# Patient Record
Sex: Female | Born: 1985
Health system: Southern US, Community
[De-identification: ages and names within clinical notes are randomized; demographics above are authoritative.]

## PROBLEM LIST (undated history)

## (undated) ENCOUNTER — Inpatient Hospital Stay (HOSPITAL_COMMUNITY): Payer: Self-pay

## (undated) DIAGNOSIS — O30049 Twin pregnancy, dichorionic/diamniotic, unspecified trimester: Secondary | ICD-10-CM

## (undated) DIAGNOSIS — R87619 Unspecified abnormal cytological findings in specimens from cervix uteri: Secondary | ICD-10-CM

## (undated) DIAGNOSIS — O139 Gestational [pregnancy-induced] hypertension without significant proteinuria, unspecified trimester: Secondary | ICD-10-CM

## (undated) DIAGNOSIS — K219 Gastro-esophageal reflux disease without esophagitis: Secondary | ICD-10-CM

## (undated) DIAGNOSIS — IMO0002 Reserved for concepts with insufficient information to code with codable children: Secondary | ICD-10-CM

## (undated) DIAGNOSIS — Z8669 Personal history of other diseases of the nervous system and sense organs: Secondary | ICD-10-CM

## (undated) DIAGNOSIS — R519 Headache, unspecified: Secondary | ICD-10-CM

## (undated) DIAGNOSIS — R51 Headache: Secondary | ICD-10-CM

## (undated) DIAGNOSIS — O329XX0 Maternal care for malpresentation of fetus, unspecified, not applicable or unspecified: Secondary | ICD-10-CM

## (undated) DIAGNOSIS — D649 Anemia, unspecified: Secondary | ICD-10-CM

## (undated) DIAGNOSIS — I10 Essential (primary) hypertension: Secondary | ICD-10-CM

## (undated) DIAGNOSIS — Z98891 History of uterine scar from previous surgery: Secondary | ICD-10-CM

## (undated) DIAGNOSIS — R0602 Shortness of breath: Secondary | ICD-10-CM

## (undated) DIAGNOSIS — O3090 Multiple gestation, unspecified, unspecified trimester: Secondary | ICD-10-CM

## (undated) HISTORY — DX: Headache, unspecified: R51.9

## (undated) HISTORY — PX: WISDOM TOOTH EXTRACTION: SHX21

## (undated) HISTORY — DX: Headache: R51

## (undated) HISTORY — PX: TUBAL LIGATION: SHX77

---
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
## 2006-05-23 ENCOUNTER — Emergency Department (HOSPITAL_COMMUNITY): Admission: EM | Admit: 2006-05-23 | Discharge: 2006-05-23 | Payer: Self-pay | Admitting: Emergency Medicine

## 2006-07-10 ENCOUNTER — Emergency Department (HOSPITAL_COMMUNITY): Admission: EM | Admit: 2006-07-10 | Discharge: 2006-07-10 | Payer: Self-pay | Admitting: Emergency Medicine

## 2006-08-31 ENCOUNTER — Emergency Department (HOSPITAL_COMMUNITY): Admission: EM | Admit: 2006-08-31 | Discharge: 2006-08-31 | Payer: Self-pay | Admitting: Emergency Medicine

## 2006-09-04 ENCOUNTER — Inpatient Hospital Stay (HOSPITAL_COMMUNITY): Admission: AD | Admit: 2006-09-04 | Discharge: 2006-09-04 | Payer: Self-pay | Admitting: Family Medicine

## 2007-03-11 ENCOUNTER — Inpatient Hospital Stay (HOSPITAL_COMMUNITY): Admission: AD | Admit: 2007-03-11 | Discharge: 2007-03-11 | Payer: Self-pay | Admitting: Obstetrics and Gynecology

## 2007-04-28 ENCOUNTER — Inpatient Hospital Stay (HOSPITAL_COMMUNITY): Admission: AD | Admit: 2007-04-28 | Discharge: 2007-04-28 | Payer: Self-pay | Admitting: Obstetrics & Gynecology

## 2007-04-29 ENCOUNTER — Inpatient Hospital Stay (HOSPITAL_COMMUNITY): Admission: AD | Admit: 2007-04-29 | Discharge: 2007-04-29 | Payer: Self-pay | Admitting: Obstetrics and Gynecology

## 2007-05-05 ENCOUNTER — Inpatient Hospital Stay (HOSPITAL_COMMUNITY): Admission: AD | Admit: 2007-05-05 | Discharge: 2007-05-07 | Payer: Self-pay | Admitting: Obstetrics and Gynecology

## 2007-12-28 ENCOUNTER — Emergency Department (HOSPITAL_COMMUNITY): Admission: EM | Admit: 2007-12-28 | Discharge: 2007-12-28 | Payer: Self-pay | Admitting: Emergency Medicine

## 2008-07-30 ENCOUNTER — Emergency Department (HOSPITAL_COMMUNITY): Admission: EM | Admit: 2008-07-30 | Discharge: 2008-07-30 | Payer: Self-pay | Admitting: Emergency Medicine

## 2008-09-17 ENCOUNTER — Emergency Department (HOSPITAL_COMMUNITY): Admission: EM | Admit: 2008-09-17 | Discharge: 2008-09-17 | Payer: Self-pay | Admitting: Emergency Medicine

## 2009-02-04 ENCOUNTER — Emergency Department (HOSPITAL_COMMUNITY): Admission: EM | Admit: 2009-02-04 | Discharge: 2009-02-04 | Payer: Self-pay | Admitting: Emergency Medicine

## 2009-03-26 ENCOUNTER — Encounter: Admission: RE | Admit: 2009-03-26 | Discharge: 2009-03-26 | Payer: Self-pay | Admitting: Nephrology

## 2009-07-09 ENCOUNTER — Emergency Department (HOSPITAL_COMMUNITY): Admission: EM | Admit: 2009-07-09 | Discharge: 2009-07-09 | Payer: Self-pay | Admitting: Family Medicine

## 2010-12-16 ENCOUNTER — Emergency Department (HOSPITAL_BASED_OUTPATIENT_CLINIC_OR_DEPARTMENT_OTHER)
Admission: EM | Admit: 2010-12-16 | Discharge: 2010-12-16 | Disposition: A | Discharge status: Home / Self Care | Payer: Medicaid Other | Attending: Emergency Medicine | Admitting: Emergency Medicine

## 2010-12-16 DIAGNOSIS — J45909 Unspecified asthma, uncomplicated: Secondary | ICD-10-CM | POA: Insufficient documentation

## 2010-12-16 DIAGNOSIS — R0789 Other chest pain: Secondary | ICD-10-CM | POA: Insufficient documentation

## 2011-01-27 ENCOUNTER — Emergency Department (HOSPITAL_COMMUNITY)
Admission: EM | Admit: 2011-01-27 | Discharge: 2011-01-27 | Disposition: A | Discharge status: Home / Self Care | Payer: Self-pay | Attending: Emergency Medicine | Admitting: Emergency Medicine

## 2011-01-27 DIAGNOSIS — J45909 Unspecified asthma, uncomplicated: Secondary | ICD-10-CM | POA: Insufficient documentation

## 2011-01-27 DIAGNOSIS — M79609 Pain in unspecified limb: Secondary | ICD-10-CM | POA: Insufficient documentation

## 2011-01-27 DIAGNOSIS — G5600 Carpal tunnel syndrome, unspecified upper limb: Secondary | ICD-10-CM | POA: Insufficient documentation

## 2011-01-27 DIAGNOSIS — R209 Unspecified disturbances of skin sensation: Secondary | ICD-10-CM | POA: Insufficient documentation

## 2011-05-22 LAB — URINALYSIS, ROUTINE W REFLEX MICROSCOPIC
Bilirubin Urine: NEGATIVE
Glucose, UA: NEGATIVE mg/dL
Hgb urine dipstick: NEGATIVE
Ketones, ur: NEGATIVE mg/dL
Nitrite: NEGATIVE
Protein, ur: NEGATIVE mg/dL
Specific Gravity, Urine: 1.025 (ref 1.005–1.030)
Urobilinogen, UA: 0.2 mg/dL (ref 0.0–1.0)
pH: 6.5 (ref 5.0–8.0)

## 2011-05-22 LAB — URINE CULTURE: Colony Count: 15000

## 2011-05-22 LAB — URINE MICROSCOPIC-ADD ON

## 2011-05-22 LAB — POCT PREGNANCY, URINE: Preg Test, Ur: NEGATIVE

## 2011-05-28 LAB — CBC
HCT: 25.9 — ABNORMAL LOW
MCHC: 33.7
MCV: 87.2
Platelets: 221
RBC: 3.83 — ABNORMAL LOW
RDW: 15.2 — ABNORMAL HIGH
RDW: 15.4 — ABNORMAL HIGH

## 2011-05-28 LAB — RPR: RPR Ser Ql: NONREACTIVE

## 2011-05-29 LAB — URINALYSIS, ROUTINE W REFLEX MICROSCOPIC
Bilirubin Urine: NEGATIVE
Ketones, ur: NEGATIVE
Nitrite: NEGATIVE
Specific Gravity, Urine: 1.01
Urobilinogen, UA: 0.2

## 2011-06-01 LAB — URINALYSIS, ROUTINE W REFLEX MICROSCOPIC
Bilirubin Urine: NEGATIVE
Hgb urine dipstick: NEGATIVE
Protein, ur: NEGATIVE
Urobilinogen, UA: 0.2

## 2011-06-20 ENCOUNTER — Emergency Department (HOSPITAL_COMMUNITY)
Admission: EM | Admit: 2011-06-20 | Discharge: 2011-06-20 | Disposition: A | Discharge status: Home / Self Care | Payer: Self-pay | Attending: Emergency Medicine | Admitting: Emergency Medicine

## 2011-06-20 DIAGNOSIS — J029 Acute pharyngitis, unspecified: Secondary | ICD-10-CM | POA: Insufficient documentation

## 2011-06-20 DIAGNOSIS — J3489 Other specified disorders of nose and nasal sinuses: Secondary | ICD-10-CM | POA: Insufficient documentation

## 2011-06-20 DIAGNOSIS — H9209 Otalgia, unspecified ear: Secondary | ICD-10-CM | POA: Insufficient documentation

## 2011-10-22 ENCOUNTER — Emergency Department (HOSPITAL_COMMUNITY)
Admission: EM | Admit: 2011-10-22 | Discharge: 2011-10-22 | Disposition: A | Discharge status: Home / Self Care | Payer: Managed Care, Other (non HMO) | Attending: Emergency Medicine | Admitting: Emergency Medicine

## 2011-10-22 ENCOUNTER — Emergency Department (HOSPITAL_COMMUNITY): Payer: Managed Care, Other (non HMO)

## 2011-10-22 ENCOUNTER — Encounter (HOSPITAL_COMMUNITY): Payer: Self-pay | Admitting: Emergency Medicine

## 2011-10-22 DIAGNOSIS — R51 Headache: Secondary | ICD-10-CM | POA: Insufficient documentation

## 2011-10-22 MED ORDER — ALPRAZOLAM 0.25 MG PO TABS: 0.2500 mg | ORAL_TABLET | Freq: Every evening | ORAL | Status: AC | PRN

## 2011-10-22 NOTE — ED Notes (Signed)
Pt here from  Home with c/o high blood pressure , dizziness and lightheaded , Parents have htn

## 2011-10-22 NOTE — ED Provider Notes (Signed)
History     CSN: [id number]  Arrival date & time 10/22/11  1255   First MD Initiated Contact with Patient 10/22/11 1454      Chief Complaint  Patient presents with  . Headache    (Consider location/radiation/quality/duration/timing/severity/associated sxs/prior treatment) Patient is a 26 y.o. female presenting with headaches. The history is provided by the patient.  Headache    patient here with headaches x6 months. Symptoms are persistent and nothing makes them better or worse. No fever or nasal congestion. Took her blood pressure last night was slightly elevated with a systolic 150 diastolic of 90. Denies any chest pain or chest pressure. No shortness of breath. No medications taken for this. Denies any visual changes or upper or lower extremity weakness.  History reviewed. No pertinent past medical history.  History reviewed. No pertinent past surgical history.  Family History  Problem Relation Age of Onset  . Hypertension Mother   . Hypertension Father     History  Substance Use Topics  . Smoking status: Never Smoker   . Smokeless tobacco: Not on file  . Alcohol Use: Yes    OB History    Grav Para Term Preterm Abortions TAB SAB Ect Mult Living                  Review of Systems  Neurological: Positive for headaches.  All other systems reviewed and are negative.    Allergies  Review of patient's allergies indicates no known allergies.  Home Medications   Current Outpatient Rx  Name Route Sig Dispense Refill  . ALBUTEROL SULFATE HFA 108 (90 BASE) MCG/ACT IN AERS Inhalation Inhale 2 puffs into the lungs every 6 (six) hours as needed. For shortness of breath      BP 131/88  Pulse 78  Temp(Src) 97.9 F (36.6 C) (Oral)  Resp 20  SpO2 98%  Physical Exam  Nursing note and vitals reviewed. Constitutional: She is oriented to person, place, and time. She appears well-developed and well-nourished.  Non-toxic appearance. No distress.  HENT:  Head:  Normocephalic and atraumatic.  Eyes: Conjunctivae, EOM and lids are normal. Pupils are equal, round, and reactive to light.  Neck: Normal range of motion. Neck supple. No tracheal deviation present. No mass present.  Cardiovascular: Normal rate, regular rhythm and normal heart sounds.  Exam reveals no gallop.   No murmur heard. Pulmonary/Chest: Effort normal and breath sounds normal. No stridor. No respiratory distress. She has no decreased breath sounds. She has no wheezes. She has no rhonchi. She has no rales.  Abdominal: Soft. Normal appearance and bowel sounds are normal. She exhibits no distension. There is no tenderness. There is no rebound and no CVA tenderness.  Musculoskeletal: Normal range of motion. She exhibits no edema and no tenderness.  Neurological: She is alert and oriented to person, place, and time. She has normal strength. No cranial nerve deficit or sensory deficit. GCS eye subscore is 4. GCS verbal subscore is 5. GCS motor subscore is 6.  Skin: Skin is warm and dry. No abrasion and no rash noted.  Psychiatric: She has a normal mood and affect. Her speech is normal and behavior is normal.    ED Course  Procedures (including critical care time)  Labs Reviewed - No data to display No results found.   No diagnosis found.    MDM  Patient's head CT results reviewed with her and are negative. She is instructed to keep her followup appointment with her primary care Dr.  She was instructed to return if her symptoms get worse        Toy Baker, MD 10/22/11 [phone]

## 2011-10-22 NOTE — Discharge Instructions (Signed)

## 2012-04-08 LAB — OB RESULTS CONSOLE RPR: RPR: NONREACTIVE

## 2012-04-08 LAB — OB RESULTS CONSOLE ANTIBODY SCREEN: Antibody Screen: NEGATIVE

## 2012-04-08 LAB — OB RESULTS CONSOLE HIV ANTIBODY (ROUTINE TESTING): HIV: NONREACTIVE

## 2012-04-08 LAB — OB RESULTS CONSOLE HEPATITIS B SURFACE ANTIGEN: Hepatitis B Surface Ag: NEGATIVE

## 2012-04-08 LAB — OB RESULTS CONSOLE ABO/RH: RH Type: POSITIVE

## 2012-07-18 ENCOUNTER — Inpatient Hospital Stay (HOSPITAL_COMMUNITY)
Admission: AD | Admit: 2012-07-18 | Discharge: 2012-07-18 | Disposition: A | Discharge status: Hospice | Payer: Managed Care, Other (non HMO) | Source: Ambulatory Visit | Attending: Obstetrics and Gynecology | Admitting: Obstetrics and Gynecology

## 2012-07-18 ENCOUNTER — Encounter (HOSPITAL_COMMUNITY): Payer: Self-pay

## 2012-07-18 DIAGNOSIS — O26899 Other specified pregnancy related conditions, unspecified trimester: Secondary | ICD-10-CM

## 2012-07-18 DIAGNOSIS — R5383 Other fatigue: Secondary | ICD-10-CM

## 2012-07-18 DIAGNOSIS — R51 Headache: Secondary | ICD-10-CM | POA: Insufficient documentation

## 2012-07-18 DIAGNOSIS — O99891 Other specified diseases and conditions complicating pregnancy: Secondary | ICD-10-CM | POA: Insufficient documentation

## 2012-07-18 DIAGNOSIS — R42 Dizziness and giddiness: Secondary | ICD-10-CM

## 2012-07-18 DIAGNOSIS — IMO0002 Reserved for concepts with insufficient information to code with codable children: Secondary | ICD-10-CM | POA: Insufficient documentation

## 2012-07-18 DIAGNOSIS — R519 Headache, unspecified: Secondary | ICD-10-CM

## 2012-07-18 HISTORY — DX: Gastro-esophageal reflux disease without esophagitis: K21.9

## 2012-07-18 HISTORY — DX: Anemia, unspecified: D64.9

## 2012-07-18 HISTORY — DX: Gestational (pregnancy-induced) hypertension without significant proteinuria, unspecified trimester: O13.9

## 2012-07-18 HISTORY — DX: Essential (primary) hypertension: I10

## 2012-07-18 LAB — COMPREHENSIVE METABOLIC PANEL
AST: 15 U/L (ref 0–37)
BUN: 5 mg/dL — ABNORMAL LOW (ref 6–23)
CO2: 22 mEq/L (ref 19–32)
Chloride: 100 mEq/L (ref 96–112)
Creatinine, Ser: 0.58 mg/dL (ref 0.50–1.10)
GFR calc non Af Amer: >90 mL/min (ref >90)
Total Bilirubin: 0.2 mg/dL — ABNORMAL LOW (ref 0.3–1.2)

## 2012-07-18 LAB — CBC
HCT: 31.5 % — ABNORMAL LOW (ref 36.0–46.0)
Hemoglobin: 10.7 g/dL — ABNORMAL LOW (ref 12.0–15.0)
MCV: 83.8 fL (ref 78.0–100.0)
RBC: 3.76 MIL/uL — ABNORMAL LOW (ref 3.87–5.11)
WBC: 10.2 10*3/uL (ref 4.0–10.5)

## 2012-07-18 LAB — URINALYSIS, ROUTINE W REFLEX MICROSCOPIC
Glucose, UA: NEGATIVE mg/dL
Hgb urine dipstick: NEGATIVE
Ketones, ur: NEGATIVE mg/dL
Protein, ur: NEGATIVE mg/dL

## 2012-07-18 MED ORDER — PRENATAL VITAMINS 28-0.8 MG PO TABS: 1.0000 | ORAL_TABLET | Freq: Every day | ORAL | Status: DC

## 2012-07-18 NOTE — MAU Provider Note (Signed)
Chief Complaint:  Dizziness, Nausea and Headache   First Provider Initiated Contact with Patient 07/18/12 1601      HPI: Rachel [name] is a 26 y.o. G2P1001 at 10w4dwho presents to maternity admissions reporting dizziness, fatigue, and frequent h/a x2-3 weeks.  Pt forgot to discuss this at prenatal visit today and was worried that she wouldn't be seen for 3-4 more weeks so she came in to MAU today.  She reports good fetal movement, denies LOF, vaginal bleeding, vaginal itching/burning, urinary symptoms, n/v, or fever/chills.     Past Medical History: Past Medical History  Diagnosis Date  . Hypertension   . Anemia   . Pregnancy induced hypertension   . Anxiety   . GERD (gastroesophageal reflux disease)      Past Surgical History: Past Surgical History  Procedure Date  . Wisdom tooth extraction     Family History: Family History  Problem Relation Age of Onset  . Hypertension Mother   . Hypertension Father     Social History: History  Substance Use Topics  . Smoking status: Former Smoker    Types: Cigarettes    Quit date: 07/18/2009  . Smokeless tobacco: Not on file  . Alcohol Use: Yes     Comment: occasional but not while pregnant    Allergies: No Known Allergies  Meds:  Prescriptions prior to admission  Medication Sig Dispense Refill  . Prenatal Vit-Fe Fumarate-FA (PRENATAL MULTIVITAMIN) TABS Take 1 tablet by mouth daily.      . ranitidine (ZANTAC) 150 MG tablet Take 150 mg by mouth daily as needed. heartburn        ROS: Pertinent findings in history of present illness.  Physical Exam  Blood pressure 139/73, pulse 98, temperature 99.3 F (37.4 C), temperature source Oral, resp. rate 16, height 5\' 7"  (1.702 m), weight 86.002 kg (189 lb 9.6 oz), last menstrual period 10/16/2011, SpO2 100.00%. GENERAL: Well-developed, well-nourished female in no acute distress.  HEENT: normocephalic HEART: normal rate RESP: normal effort ABDOMEN: Soft, non-tender, gravid  appropriate for gestational age EXTREMITIES: Nontender, no edema NEURO: alert and oriented SPECULUM EXAM: Deferred    FHT Baby A:  Baseline 140 , moderate variability, accelerations present (10x10), no decelerations FHT Baby B: Baseline 140 , moderate variability, accelerations present (10 x10), no decelerations Contractions: None   Labs: Results for orders placed during the hospital encounter of 07/18/12 (from the past 24 hour(s))  URINALYSIS, ROUTINE W REFLEX MICROSCOPIC     Status: Abnormal   Collection Time   07/18/12  3:10 PM      Component Value Range   Color, Urine YELLOW  YELLOW   APPearance CLOUDY (*) CLEAR   Specific Gravity, Urine 1.020  1.005 - 1.030   pH 7.0  5.0 - 8.0   Glucose, UA NEGATIVE  NEGATIVE mg/dL   Hgb urine dipstick NEGATIVE  NEGATIVE   Bilirubin Urine NEGATIVE  NEGATIVE   Ketones, ur NEGATIVE  NEGATIVE mg/dL   Protein, ur NEGATIVE  NEGATIVE mg/dL   Urobilinogen, UA 0.2  0.0 - 1.0 mg/dL   Nitrite NEGATIVE  NEGATIVE   Leukocytes, UA TRACE (*) NEGATIVE  URINE MICROSCOPIC-ADD ON     Status: Abnormal   Collection Time   07/18/12  3:10 PM      Component Value Range   Squamous Epithelial / LPF MANY (*) RARE   WBC, UA 0-2  <3 WBC/hpf   Bacteria, UA MANY (*) RARE    Assessment: 1. Dizziness   2. Fatigue  3. Headache in pregnancy     Plan: Called Dr Jackelyn Knife to discuss assessment and findings Discharge home Increase PO fluids and food intake Tylenol for h/a F/U with prenatal provider Return to MAU as needed    Medication List     As of 07/18/2012  4:08 PM    ASK your doctor about these medications         prenatal multivitamin Tabs   Take 1 tablet by mouth daily.      ranitidine 150 MG tablet   Commonly known as: ZANTAC   Take 150 mg by mouth daily as needed. heartburn        Sharen Counter Certified Nurse-Midwife 07/18/2012 4:08 PM

## 2012-07-18 NOTE — MAU Note (Signed)
Patient states she has been having headaches, having periods of feeling dizzy and drained for 1-2 weeks. Has nausea with occasional vomiting and not eating well. Patient denies any contractions, leaking or bleeding and reports good fetal movement. Patient is having twins.

## 2012-07-18 NOTE — MAU Note (Signed)
Pt c/o extreme fatigue and a moderate headache.  States she has been stressed dealing with many life changes.  She says she doesn't drink much water but she eats a lot of ice.

## 2012-08-29 ENCOUNTER — Other Ambulatory Visit: Payer: Self-pay | Admitting: Obstetrics and Gynecology

## 2012-08-29 ENCOUNTER — Inpatient Hospital Stay (HOSPITAL_COMMUNITY)
Admission: AD | Admit: 2012-08-29 | Discharge: 2012-08-29 | Disposition: A | Discharge status: Home / Self Care | Payer: Medicaid Other | Source: Ambulatory Visit | Attending: Obstetrics and Gynecology | Admitting: Obstetrics and Gynecology

## 2012-08-29 DIAGNOSIS — N883 Incompetence of cervix uteri: Secondary | ICD-10-CM

## 2012-08-29 DIAGNOSIS — O4700 False labor before 37 completed weeks of gestation, unspecified trimester: Secondary | ICD-10-CM | POA: Insufficient documentation

## 2012-08-29 MED ORDER — BETAMETHASONE SOD PHOS & ACET 6 (3-3) MG/ML IJ SUSP
12.0000 mg | Freq: Once | INTRAMUSCULAR | Status: AC
  Administered 2012-08-29: 12 mg via INTRAMUSCULAR
  Filled 2012-08-29: qty 2

## 2012-08-30 ENCOUNTER — Inpatient Hospital Stay (HOSPITAL_COMMUNITY)
Admission: AD | Admit: 2012-08-30 | Discharge: 2012-08-30 | Disposition: A | Discharge status: Home / Self Care | Payer: Managed Care, Other (non HMO) | Source: Ambulatory Visit | Attending: Obstetrics and Gynecology | Admitting: Obstetrics and Gynecology

## 2012-08-30 DIAGNOSIS — O4700 False labor before 37 completed weeks of gestation, unspecified trimester: Secondary | ICD-10-CM | POA: Insufficient documentation

## 2012-08-30 MED ORDER — BETAMETHASONE SOD PHOS & ACET 6 (3-3) MG/ML IJ SUSP
12.0000 mg | Freq: Once | INTRAMUSCULAR | Status: AC
  Administered 2012-08-30: 12 mg via INTRAMUSCULAR
  Filled 2012-08-30: qty 2

## 2012-09-30 ENCOUNTER — Encounter (HOSPITAL_COMMUNITY): Payer: Self-pay | Admitting: Pharmacist

## 2012-10-03 ENCOUNTER — Encounter (HOSPITAL_COMMUNITY): Payer: Self-pay

## 2012-10-04 ENCOUNTER — Encounter (HOSPITAL_COMMUNITY)
Admission: RE | Admit: 2012-10-04 | Discharge: 2012-10-04 | Disposition: A | Discharge status: Home / Self Care | Payer: PRIVATE HEALTH INSURANCE | Source: Ambulatory Visit | Attending: Obstetrics and Gynecology | Admitting: Obstetrics and Gynecology

## 2012-10-04 ENCOUNTER — Encounter (HOSPITAL_COMMUNITY): Payer: Self-pay

## 2012-10-04 HISTORY — DX: Shortness of breath: R06.02

## 2012-10-04 LAB — CBC
MCV: 80.7 fL (ref 78.0–100.0)
Platelets: 177 10*3/uL (ref 150–400)
RDW: 14.9 % (ref 11.5–15.5)
WBC: 7.8 10*3/uL (ref 4.0–10.5)

## 2012-10-04 LAB — TYPE AND SCREEN: Antibody Screen: NEGATIVE

## 2012-10-04 NOTE — Patient Instructions (Addendum)
Your procedure is scheduled on:10/06/12  Enter through the Main Entrance at :8am Pick up desk phone and dial [phone] and inform us of your arrival.  Please call [phone] if you have any problems the morning of surgery.  Remember: Do not eat or drink after midnight:WED   Take these meds the morning of surgery with a sip of water:ZANTAC   DO NOT wear jewelry, eye make-up, lipstick,body lotion, or dark fingernail polish. Do not shave for 48 hours prior to surgery.  If you are to be admitted after surgery, leave suitcase in car until your room has been assigned. Patients discharged on the day of surgery will not be allowed to drive home.

## 2012-10-05 ENCOUNTER — Encounter (HOSPITAL_COMMUNITY): Payer: Self-pay | Admitting: Obstetrics and Gynecology

## 2012-10-05 DIAGNOSIS — O30049 Twin pregnancy, dichorionic/diamniotic, unspecified trimester: Secondary | ICD-10-CM

## 2012-10-05 DIAGNOSIS — O3090 Multiple gestation, unspecified, unspecified trimester: Secondary | ICD-10-CM

## 2012-10-05 DIAGNOSIS — O329XX0 Maternal care for malpresentation of fetus, unspecified, not applicable or unspecified: Secondary | ICD-10-CM

## 2012-10-05 HISTORY — DX: Multiple gestation, unspecified, unspecified trimester: O32.9XX0

## 2012-10-05 HISTORY — DX: Multiple gestation, unspecified, unspecified trimester: O30.90

## 2012-10-05 HISTORY — DX: Twin pregnancy, dichorionic/diamniotic, unspecified trimester: O30.049

## 2012-10-05 NOTE — H&P (Signed)
Marieann T Marines is a 27 y.o. female G2P1001 at 37wk with twin IUP.  Pregnancy complicated by shortened cervix, treated with vaginal progesterone. Received BMZ 1/13 and 1/14.  First Trimester screen revealed increased NT, nl fetal echo x 2.  Baby A with < 10% - nl BPP and dopplers, nl AFI - followed by [place].  D/W pt r/b/a of LTCS for delivery of twins as well as BTL, by salpingectomy.  Also abnormal pap ASCUS with HR HPV, nl colpo - will follow PP.?HTN, nl BP in pregnancy.    Maternal Medical History:  Contractions: Frequency: irregular.    Fetal activity: Perceived fetal activity is normal.    Prenatal complications: IUGR.     OB History   Grav Para Term Preterm Abortions TAB SAB Ect Mult Living   2 1 1       1     G1 SVD 6#8 female, G2 present.  + abn pap - nl colpo in preg, no STDS Past Medical History  Diagnosis Date  . Hypertension   . Pregnancy induced hypertension   . Shortness of breath     with bronchitis  . GERD (gastroesophageal reflux disease)     pregnancy related  . Anemia     1st pregnancy  . 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 10/05/2012  . Multiple gestation with malpresentation of one fetus or more 10/05/2012   Past Surgical History  Procedure Laterality Date  . Wisdom tooth extraction     Family History: family history includes Hypertension in her father and mother.RA, Asthma, DM, Esophhageal CA, ESRD, lung CA Social History:  reports that she quit smoking about 3 years ago. Her smoking use included Cigarettes. She smoked 0.00 packs per day. She does not have any smokeless tobacco history on file. She reports that  drinks alcohol. She reports that she does not use illicit drugs.single Meds vag progesterone, PNV, Zantac All NKDA   Prenatal Transfer Tool  Maternal Diabetes: No Genetic Screening: Abnormal:  Results: Other:increased nl NT, nl fetal echo Maternal Ultrasounds/Referrals: Abnormal:  Findings:   IUGR baby A, nl dopplers, BPP, AFI Fetal  Ultrasounds or other Referrals:  Fetal echo Maternal Substance Abuse:  No Significant Maternal Medications:  Meds include: Progesterone Zantac Significant Maternal Lab Results:  Lab values include: Group B Strep negative Other Comments:  Di/Di twins. BMZ 1/13 and 1/14  Review of Systems  Constitutional: Negative.   HENT: Negative.   Eyes: Negative.   Respiratory: Negative.   Cardiovascular: Negative.   Gastrointestinal: Negative.   Genitourinary: Negative.   Musculoskeletal: Negative.   Skin: Negative.   Neurological: Negative.   Psychiatric/Behavioral: Negative.       Height 5\' 7"  (1.702 m), weight 87.544 kg (193 lb), last menstrual period 01/21/2012. Maternal Exam:  Abdomen: Fundal height is 43 cm at last prenatal visit.   Estimated fetal weight is 5, 5-6#, twin A vtx, twin B breecj.   Fetal presentation: vertex  Introitus: Normal vulva. Normal vagina.  Pelvis: adequate for delivery.   Cervix: Cervix evaluated by digital exam.     Physical Exam  Constitutional: She is oriented to person, place, and time. She appears well-developed and well-nourished.  HENT:  Head: Normocephalic and atraumatic.  Cardiovascular: Normal rate and regular rhythm.   Respiratory: Effort normal and breath sounds normal. No respiratory distress.  GI: Soft. Bowel sounds are normal. There is no tenderness.  Musculoskeletal: Normal range of motion.  Neurological: She is alert and oriented to person, place,  and time.  Skin: Skin is warm and dry.  Psychiatric: She has a normal mood and affect. Her behavior is normal.    Prenatal labs: ABO, Rh: --/--/O POS, O POS (02/18 1420) Antibody: NEG (02/18 1420) Rubella: Immune (08/23 0000) RPR: NON REACTIVE (02/18 1420)  HBsAg: Negative (08/23 0000)  HIV: Non-reactive (08/23 0000)  GBS:   neg Hgb 11.9/ Plt 215K/ Hgb electro WNL/ GC neg/ Chl neg/ First Tri WNL/ AFP WNL/nl glucola  Early [place] twins EDC 3/15, di/di, inc NT - nl fetal echo x 2 [place] nl anat x  2, A ant plac female B post plac female Growth followed with [place] Growth US reveals A < 10% nl dopplers, AFI and BPP  30d papers signed 1/13  Assessment/Plan: 26yo G2P1001 at 37 with twin IUP - vtx/Breech For primary LTCS and BTL  D/w pt r/b/a of LTCS and BTL gbbs neg Desires salpingectomy - despite young age, desires permanence Will have [place] prior to C/S to confirm presentation   BOVARD,Masin Shatto 10/05/2012, 9:47 PM

## 2012-10-06 ENCOUNTER — Inpatient Hospital Stay (HOSPITAL_COMMUNITY): Payer: PRIVATE HEALTH INSURANCE | Admitting: Anesthesiology

## 2012-10-06 ENCOUNTER — Encounter (HOSPITAL_COMMUNITY): Payer: Self-pay | Admitting: Obstetrics and Gynecology

## 2012-10-06 ENCOUNTER — Encounter (HOSPITAL_COMMUNITY)
Admission: AD | Disposition: A | Discharge status: Home / Self Care | Payer: Self-pay | Source: Ambulatory Visit | Attending: Obstetrics and Gynecology

## 2012-10-06 ENCOUNTER — Encounter (HOSPITAL_COMMUNITY): Payer: Self-pay | Admitting: Anesthesiology

## 2012-10-06 ENCOUNTER — Inpatient Hospital Stay (HOSPITAL_COMMUNITY)
Admission: AD | Admit: 2012-10-06 | Discharge: 2012-10-09 | DRG: 765 | Disposition: A | Discharge status: Home / Self Care | Payer: PRIVATE HEALTH INSURANCE | Source: Ambulatory Visit | Attending: Obstetrics and Gynecology | Admitting: Obstetrics and Gynecology

## 2012-10-06 DIAGNOSIS — Z302 Encounter for sterilization: Secondary | ICD-10-CM

## 2012-10-06 DIAGNOSIS — O30049 Twin pregnancy, dichorionic/diamniotic, unspecified trimester: Secondary | ICD-10-CM

## 2012-10-06 DIAGNOSIS — O30009 Twin pregnancy, unspecified number of placenta and unspecified number of amniotic sacs, unspecified trimester: Secondary | ICD-10-CM

## 2012-10-06 DIAGNOSIS — O26879 Cervical shortening, unspecified trimester: Principal | ICD-10-CM | POA: Diagnosis present

## 2012-10-06 DIAGNOSIS — Z98891 History of uterine scar from previous surgery: Secondary | ICD-10-CM

## 2012-10-06 DIAGNOSIS — O3090 Multiple gestation, unspecified, unspecified trimester: Secondary | ICD-10-CM

## 2012-10-06 DIAGNOSIS — O365990 Maternal care for other known or suspected poor fetal growth, unspecified trimester, not applicable or unspecified: Secondary | ICD-10-CM | POA: Diagnosis present

## 2012-10-06 DIAGNOSIS — O329XX0 Maternal care for malpresentation of fetus, unspecified, not applicable or unspecified: Secondary | ICD-10-CM

## 2012-10-06 HISTORY — DX: History of uterine scar from previous surgery: Z98.891

## 2012-10-06 HISTORY — DX: Twin pregnancy, dichorionic/diamniotic, unspecified trimester: O30.049

## 2012-10-06 HISTORY — DX: Multiple gestation, unspecified, unspecified trimester: O30.90

## 2012-10-06 HISTORY — PX: BILATERAL SALPINGECTOMY: SHX5743

## 2012-10-06 HISTORY — DX: Maternal care for malpresentation of fetus, unspecified, not applicable or unspecified: O32.9XX0

## 2012-10-06 LAB — CBC
MCHC: 32.8 g/dL (ref 30.0–36.0)
Platelets: 149 10*3/uL — ABNORMAL LOW (ref 150–400)
RDW: 14.9 % (ref 11.5–15.5)

## 2012-10-06 SURGERY — Surgical Case
Anesthesia: Spinal | Wound class: Clean Contaminated

## 2012-10-06 MED ORDER — LACTATED RINGERS IV SOLN: INTRAVENOUS | Status: DC

## 2012-10-06 MED ORDER — MEPERIDINE HCL 25 MG/ML IJ SOLN: 6.2500 mg | INTRAMUSCULAR | Status: DC | PRN

## 2012-10-06 MED ORDER — DIPHENHYDRAMINE HCL 50 MG/ML IJ SOLN: 25.0000 mg | INTRAMUSCULAR | Status: DC | PRN

## 2012-10-06 MED ORDER — DIPHENHYDRAMINE HCL 25 MG PO CAPS
25.0000 mg | ORAL_CAPSULE | ORAL | Status: DC | PRN
  Administered 2012-10-08: 25 mg via ORAL
  Filled 2012-10-06: qty 1

## 2012-10-06 MED ORDER — SIMETHICONE 80 MG PO CHEW
80.0000 mg | CHEWABLE_TABLET | Freq: Three times a day (TID) | ORAL | Status: DC
  Administered 2012-10-06 – 2012-10-09 (×10): 80 mg via ORAL

## 2012-10-06 MED ORDER — NALOXONE HCL 0.4 MG/ML IJ SOLN: 0.4000 mg | INTRAMUSCULAR | Status: DC | PRN

## 2012-10-06 MED ORDER — OXYTOCIN 10 UNIT/ML IJ SOLN
INTRAMUSCULAR | Status: AC
  Filled 2012-10-06: qty 4

## 2012-10-06 MED ORDER — LACTATED RINGERS IV SOLN
INTRAVENOUS | Status: DC | PRN
  Administered 2012-10-06: 10:00:00 via INTRAVENOUS

## 2012-10-06 MED ORDER — PANTOPRAZOLE SODIUM 40 MG PO TBEC
40.0000 mg | DELAYED_RELEASE_TABLET | Freq: Once | ORAL | Status: AC
  Administered 2012-10-06: 40 mg via ORAL

## 2012-10-06 MED ORDER — LACTATED RINGERS IV SOLN
INTRAVENOUS | Status: DC
  Administered 2012-10-06 (×2): via INTRAVENOUS

## 2012-10-06 MED ORDER — EPHEDRINE 5 MG/ML INJ
INTRAVENOUS | Status: AC
  Filled 2012-10-06: qty 10

## 2012-10-06 MED ORDER — SCOPOLAMINE 1 MG/3DAYS TD PT72
MEDICATED_PATCH | TRANSDERMAL | Status: AC
  Administered 2012-10-06: 1.5 mg via TRANSDERMAL
  Filled 2012-10-06: qty 1

## 2012-10-06 MED ORDER — PRENATAL MULTIVITAMIN CH
1.0000 | ORAL_TABLET | Freq: Every day | ORAL | Status: DC
  Administered 2012-10-07 – 2012-10-09 (×3): 1 via ORAL
  Filled 2012-10-06 (×2): qty 1

## 2012-10-06 MED ORDER — DIPHENHYDRAMINE HCL 50 MG/ML IJ SOLN: 12.5000 mg | INTRAMUSCULAR | Status: DC | PRN

## 2012-10-06 MED ORDER — ONDANSETRON HCL 4 MG/2ML IJ SOLN
4.0000 mg | INTRAMUSCULAR | Status: DC | PRN
  Administered 2012-10-06: 4 mg via INTRAVENOUS

## 2012-10-06 MED ORDER — SCOPOLAMINE 1 MG/3DAYS TD PT72
1.0000 | MEDICATED_PATCH | Freq: Once | TRANSDERMAL | Status: DC
  Administered 2012-10-06: 1.5 mg via TRANSDERMAL

## 2012-10-06 MED ORDER — KETOROLAC TROMETHAMINE 30 MG/ML IJ SOLN: 30.0000 mg | Freq: Four times a day (QID) | INTRAMUSCULAR | Status: AC | PRN

## 2012-10-06 MED ORDER — PANTOPRAZOLE SODIUM 40 MG PO TBEC
DELAYED_RELEASE_TABLET | ORAL | Status: AC
  Filled 2012-10-06: qty 1

## 2012-10-06 MED ORDER — ONDANSETRON HCL 4 MG/2ML IJ SOLN
INTRAMUSCULAR | Status: AC
  Filled 2012-10-06: qty 2

## 2012-10-06 MED ORDER — KETOROLAC TROMETHAMINE 30 MG/ML IJ SOLN
INTRAMUSCULAR | Status: AC
  Filled 2012-10-06: qty 1

## 2012-10-06 MED ORDER — NALBUPHINE HCL 10 MG/ML IJ SOLN
5.0000 mg | INTRAMUSCULAR | Status: DC | PRN
  Filled 2012-10-06: qty 1

## 2012-10-06 MED ORDER — SIMETHICONE 80 MG PO CHEW
80.0000 mg | CHEWABLE_TABLET | ORAL | Status: DC | PRN
  Administered 2012-10-08: 80 mg via ORAL

## 2012-10-06 MED ORDER — OXYCODONE-ACETAMINOPHEN 5-325 MG PO TABS
1.0000 | ORAL_TABLET | ORAL | Status: DC | PRN
  Administered 2012-10-07 – 2012-10-09 (×8): 2 via ORAL
  Filled 2012-10-06 (×8): qty 2

## 2012-10-06 MED ORDER — SCOPOLAMINE 1 MG/3DAYS TD PT72
1.0000 | MEDICATED_PATCH | Freq: Once | TRANSDERMAL | Status: DC
  Filled 2012-10-06: qty 1

## 2012-10-06 MED ORDER — FENTANYL CITRATE 0.05 MG/ML IJ SOLN
INTRAMUSCULAR | Status: AC
  Filled 2012-10-06: qty 2

## 2012-10-06 MED ORDER — METOCLOPRAMIDE HCL 5 MG/ML IJ SOLN: 10.0000 mg | Freq: Three times a day (TID) | INTRAMUSCULAR | Status: DC | PRN

## 2012-10-06 MED ORDER — MIDAZOLAM HCL 2 MG/2ML IJ SOLN: 0.5000 mg | Freq: Once | INTRAMUSCULAR | Status: DC | PRN

## 2012-10-06 MED ORDER — CEFAZOLIN SODIUM-DEXTROSE 2-3 GM-% IV SOLR
INTRAVENOUS | Status: AC
  Filled 2012-10-06: qty 50

## 2012-10-06 MED ORDER — LANOLIN HYDROUS EX OINT: 1.0000 "application " | TOPICAL_OINTMENT | CUTANEOUS | Status: DC | PRN

## 2012-10-06 MED ORDER — NALOXONE HCL 1 MG/ML IJ SOLN
1.0000 ug/kg/h | INTRAVENOUS | Status: DC | PRN
  Filled 2012-10-06: qty 2

## 2012-10-06 MED ORDER — SENNOSIDES-DOCUSATE SODIUM 8.6-50 MG PO TABS
2.0000 | ORAL_TABLET | Freq: Every day | ORAL | Status: DC
  Administered 2012-10-06 – 2012-10-08 (×3): 2 via ORAL

## 2012-10-06 MED ORDER — BUPIVACAINE IN DEXTROSE 0.75-8.25 % IT SOLN
INTRATHECAL | Status: DC | PRN
  Administered 2012-10-06: 1.6 mL via INTRATHECAL

## 2012-10-06 MED ORDER — PRENATAL VITAMINS 28-0.8 MG PO TABS: 1.0000 | ORAL_TABLET | Freq: Every day | ORAL | Status: DC

## 2012-10-06 MED ORDER — 0.9 % SODIUM CHLORIDE (POUR BTL) OPTIME
TOPICAL | Status: DC | PRN
  Administered 2012-10-06: 1000 mL

## 2012-10-06 MED ORDER — FENTANYL CITRATE 0.05 MG/ML IJ SOLN
INTRAMUSCULAR | Status: AC
  Administered 2012-10-06: 50 ug via INTRAVENOUS
  Filled 2012-10-06: qty 2

## 2012-10-06 MED ORDER — OXYTOCIN 40 UNITS IN LACTATED RINGERS INFUSION - SIMPLE MED: 62.5000 mL/h | INTRAVENOUS | Status: AC

## 2012-10-06 MED ORDER — ONDANSETRON HCL 4 MG/2ML IJ SOLN
INTRAMUSCULAR | Status: DC | PRN
  Administered 2012-10-06: 4 mg via INTRAVENOUS

## 2012-10-06 MED ORDER — IBUPROFEN 800 MG PO TABS
800.0000 mg | ORAL_TABLET | Freq: Three times a day (TID) | ORAL | Status: DC
  Administered 2012-10-07 – 2012-10-09 (×8): 800 mg via ORAL
  Filled 2012-10-06 (×9): qty 1

## 2012-10-06 MED ORDER — PROMETHAZINE HCL 25 MG/ML IJ SOLN: 6.2500 mg | INTRAMUSCULAR | Status: DC | PRN

## 2012-10-06 MED ORDER — FENTANYL CITRATE 0.05 MG/ML IJ SOLN
25.0000 ug | INTRAMUSCULAR | Status: DC | PRN
  Administered 2012-10-06 (×3): 50 ug via INTRAVENOUS

## 2012-10-06 MED ORDER — PHENYLEPHRINE HCL 10 MG/ML IJ SOLN
INTRAMUSCULAR | Status: DC | PRN
  Administered 2012-10-06 (×3): 40 ug via INTRAVENOUS

## 2012-10-06 MED ORDER — KETOROLAC TROMETHAMINE 30 MG/ML IJ SOLN
30.0000 mg | Freq: Four times a day (QID) | INTRAMUSCULAR | Status: AC | PRN
  Administered 2012-10-06 (×2): 30 mg via INTRAVENOUS
  Filled 2012-10-06: qty 1

## 2012-10-06 MED ORDER — PHENYLEPHRINE 40 MCG/ML (10ML) SYRINGE FOR IV PUSH (FOR BLOOD PRESSURE SUPPORT)
PREFILLED_SYRINGE | INTRAVENOUS | Status: AC
  Filled 2012-10-06: qty 5

## 2012-10-06 MED ORDER — SODIUM CHLORIDE 0.9 % IJ SOLN
3.0000 mL | INTRAMUSCULAR | Status: DC | PRN
  Administered 2012-10-07: 3 mL via INTRAVENOUS

## 2012-10-06 MED ORDER — FENTANYL CITRATE 0.05 MG/ML IJ SOLN
INTRAMUSCULAR | Status: DC | PRN
  Administered 2012-10-06: 25 ug via INTRATHECAL

## 2012-10-06 MED ORDER — MENTHOL 3 MG MT LOZG: 1.0000 | LOZENGE | OROMUCOSAL | Status: DC | PRN

## 2012-10-06 MED ORDER — MORPHINE SULFATE 0.5 MG/ML IJ SOLN
INTRAMUSCULAR | Status: AC
  Filled 2012-10-06: qty 10

## 2012-10-06 MED ORDER — TETANUS-DIPHTH-ACELL PERTUSSIS 5-2.5-18.5 LF-MCG/0.5 IM SUSP
0.5000 mL | Freq: Once | INTRAMUSCULAR | Status: DC
Start: 2012-10-07 — End: 2012-10-06

## 2012-10-06 MED ORDER — LACTATED RINGERS IV SOLN
Freq: Once | INTRAVENOUS | Status: AC
  Administered 2012-10-06: 08:00:00 via INTRAVENOUS

## 2012-10-06 MED ORDER — OXYTOCIN 40 UNITS IN LACTATED RINGERS INFUSION - SIMPLE MED
INTRAVENOUS | Status: DC | PRN
  Administered 2012-10-06: 40 [IU] via INTRAVENOUS

## 2012-10-06 MED ORDER — MORPHINE SULFATE (PF) 0.5 MG/ML IJ SOLN
INTRAMUSCULAR | Status: DC | PRN
  Administered 2012-10-06: .1 mg via INTRATHECAL

## 2012-10-06 MED ORDER — WITCH HAZEL-GLYCERIN EX PADS: 1.0000 "application " | MEDICATED_PAD | CUTANEOUS | Status: DC | PRN

## 2012-10-06 MED ORDER — ONDANSETRON HCL 4 MG PO TABS: 4.0000 mg | ORAL_TABLET | ORAL | Status: DC | PRN

## 2012-10-06 MED ORDER — ZOLPIDEM TARTRATE 5 MG PO TABS: 5.0000 mg | ORAL_TABLET | Freq: Every evening | ORAL | Status: DC | PRN

## 2012-10-06 MED ORDER — DIPHENHYDRAMINE HCL 25 MG PO CAPS: 25.0000 mg | ORAL_CAPSULE | Freq: Four times a day (QID) | ORAL | Status: DC | PRN

## 2012-10-06 MED ORDER — CEFAZOLIN SODIUM-DEXTROSE 2-3 GM-% IV SOLR
2.0000 g | INTRAVENOUS | Status: AC
  Administered 2012-10-06: 2 g via INTRAVENOUS

## 2012-10-06 MED ORDER — ACETAMINOPHEN 10 MG/ML IV SOLN
1000.0000 mg | Freq: Four times a day (QID) | INTRAVENOUS | Status: AC | PRN
  Administered 2012-10-06: 1000 mg via INTRAVENOUS
  Filled 2012-10-06 (×2): qty 100

## 2012-10-06 MED ORDER — DIBUCAINE 1 % RE OINT: 1.0000 "application " | TOPICAL_OINTMENT | RECTAL | Status: DC | PRN

## 2012-10-06 MED ORDER — ONDANSETRON HCL 4 MG/2ML IJ SOLN
4.0000 mg | Freq: Three times a day (TID) | INTRAMUSCULAR | Status: DC | PRN
  Filled 2012-10-06: qty 2

## 2012-10-06 MED ORDER — FAMOTIDINE 20 MG PO TABS
20.0000 mg | ORAL_TABLET | Freq: Every day | ORAL | Status: DC
  Administered 2012-10-07 – 2012-10-09 (×2): 20 mg via ORAL
  Filled 2012-10-06 (×2): qty 1

## 2012-10-06 SURGICAL SUPPLY — 38 items
BENZOIN TINCTURE PRP APPL 2/3 (GAUZE/BANDAGES/DRESSINGS) ×3 IMPLANT
CLOTH BEACON ORANGE TIMEOUT ST (SAFETY) ×3 IMPLANT
CONTAINER PREFILL 10% NBF 15ML (MISCELLANEOUS) IMPLANT
DRAPE LG THREE QUARTER DISP (DRAPES) ×3 IMPLANT
DRSG OPSITE POSTOP 4X10 (GAUZE/BANDAGES/DRESSINGS) ×3 IMPLANT
DRSG OPSITE POSTOP 4X12 (GAUZE/BANDAGES/DRESSINGS) ×3 IMPLANT
DURAPREP 26ML APPLICATOR (WOUND CARE) ×3 IMPLANT
ELECT REM PT RETURN 9FT ADLT (ELECTROSURGICAL) ×3
ELECTRODE REM PT RTRN 9FT ADLT (ELECTROSURGICAL) ×2 IMPLANT
EXTRACTOR VACUUM M CUP 4 TUBE (SUCTIONS) IMPLANT
GLOVE BIO SURGEON STRL SZ 6.5 (GLOVE) ×3 IMPLANT
GLOVE BIO SURGEON STRL SZ7 (GLOVE) ×6 IMPLANT
GLOVE BIOGEL PI IND STRL 6.5 (GLOVE) ×2 IMPLANT
GLOVE BIOGEL PI INDICATOR 6.5 (GLOVE) ×1
GLOVE INDICATOR 7.0 STRL GRN (GLOVE) ×3 IMPLANT
GOWN PREVENTION PLUS LG XLONG (DISPOSABLE) ×9 IMPLANT
KIT ABG SYR 3ML LUER SLIP (SYRINGE) IMPLANT
NEEDLE HYPO 25X5/8 SAFETYGLIDE (NEEDLE) IMPLANT
NS IRRIG 1000ML POUR BTL (IV SOLUTION) ×3 IMPLANT
PACK C SECTION WH (CUSTOM PROCEDURE TRAY) ×3 IMPLANT
PAD OB MATERNITY 4.3X12.25 (PERSONAL CARE ITEMS) ×3 IMPLANT
RTRCTR C-SECT PINK 25CM LRG (MISCELLANEOUS) ×3 IMPLANT
SLEEVE SCD COMPRESS KNEE MED (MISCELLANEOUS) ×3 IMPLANT
STAPLER VISISTAT 35W (STAPLE) IMPLANT
STRIP CLOSURE SKIN 1/2X4 (GAUZE/BANDAGES/DRESSINGS) ×3 IMPLANT
SUT MNCRL 0 VIOLET CTX 36 (SUTURE) ×4 IMPLANT
SUT MONOCRYL 0 CTX 36 (SUTURE) ×2
SUT PLAIN 1 NONE 54 (SUTURE) ×3 IMPLANT
SUT PLAIN 2 0 XLH (SUTURE) ×3 IMPLANT
SUT VIC AB 0 CT1 27 (SUTURE) ×2
SUT VIC AB 0 CT1 27XBRD ANBCTR (SUTURE) ×4 IMPLANT
SUT VIC AB 2-0 CT1 27 (SUTURE) ×1
SUT VIC AB 2-0 CT1 TAPERPNT 27 (SUTURE) ×2 IMPLANT
SUT VIC AB 4-0 KS 27 (SUTURE) IMPLANT
SYR BULB IRRIGATION 50ML (SYRINGE) ×3 IMPLANT
TOWEL OR 17X24 6PK STRL BLUE (TOWEL DISPOSABLE) ×9 IMPLANT
TRAY FOLEY CATH 14FR (SET/KITS/TRAYS/PACK) ×3 IMPLANT
WATER STERILE IRR 1000ML POUR (IV SOLUTION) ×3 IMPLANT

## 2012-10-06 NOTE — Anesthesia Preprocedure Evaluation (Signed)
Anesthesia Evaluation  Patient identified by MRN, date of birth, ID band Patient awake    Reviewed: Allergy & Precautions, H&P , NPO status , Patient's Chart, lab work & pertinent test results  Airway Mallampati: II      Dental no notable dental hx.    Pulmonary neg pulmonary ROS, shortness of breath,  breath sounds clear to auscultation  Pulmonary exam normal       Cardiovascular Exercise Tolerance: Good hypertension, negative cardio ROS  Rhythm:regular Rate:Normal     Neuro/Psych negative neurological ROS  negative psych ROS   GI/Hepatic negative GI ROS, Neg liver ROS, GERD-  ,  Endo/Other  negative endocrine ROS  Renal/GU negative Renal ROS  negative genitourinary   Musculoskeletal   Abdominal Normal abdominal exam  (+)   Peds  Hematology negative hematology ROS (+) anemia ,   Anesthesia Other Findings   Reproductive/Obstetrics (+) Pregnancy                           Anesthesia Physical Anesthesia Plan  ASA: III  Anesthesia Plan: Spinal   Post-op Pain Management:    Induction:   Airway Management Planned:   Additional Equipment:   Intra-op Plan:   Post-operative Plan:   Informed Consent: I have reviewed the patients History and Physical, chart, labs and discussed the procedure including the risks, benefits and alternatives for the proposed anesthesia with the patient or authorized representative who has indicated his/her understanding and acceptance.     Plan Discussed with: Anesthesiologist, CRNA and Surgeon  Anesthesia Plan Comments:         Anesthesia Quick Evaluation

## 2012-10-06 NOTE — Op Note (Signed)
Rachel [name], Rachel [name]             ACCOUNT NO.:  [id number]  MEDICAL RECORD NO.:  [id number]  LOCATION:  9145                          FACILITY:  [facility]  PHYSICIAN:  Sherron Monday, MD        DATE OF BIRTH:  [birth year]  DATE OF PROCEDURE:  10/06/2012 DATE OF DISCHARGE:                              OPERATIVE REPORT   PREOPERATIVE DIAGNOSIS:  Intrauterine pregnancy at 37 weeks, baby A with IUGR, vertex breech presentation of twins, undesired fertility.  POSTOPERATIVE DIAGNOSIS:  Intrauterine pregnancy at 37 weeks, baby A with IUGR, vertex breech presentation of twins, undesired fertility, delivered.  PROCEDURE:  Primary low transverse cesarean section with bilateral partial salpingectomy.  SURGEON:  Sherron Monday, MD.  ASSISTANT:  Bing Plume, MD.  ANESTHESIA:  Spinal.  IV FLUIDS:  1000 mL.  URINE OUTPUT:  200 mL clear urine at the end of procedure.  ESTIMATED BLOOD LOSS:  700 mL.  FINDINGS:  Viable female infant at 9:46 a.m. with vertex presentation, with Apgar 9 and 9.  Viable female infant at 9:48 a.m. delivered with double footling breech presentation, Apgar 9 and 9.  Normal uterus, tubes, and ovaries within bilateral paratubal cysts.  PATHOLOGY:  Placenta, bilateral tubal segments to pathology.  COMPLICATIONS:  None.  DESCRIPTION OF PROCEDURE:  After informed consent was reviewed the patient including risks, benefits, and alternatives of the surgical procedure as well as the tubal ligation.  She was transported to the OR where spinal anesthesia was induced, found to be adequate.  She was then prepped and draped in the normal sterile fashion in a supine position with a leftward tilt.  Pfannenstiel skin incision was made at the level approximately two fingerbreadths above the pubic symphysis carried through the underlying layer of fascia sharply.  The fascia was incised in the midline.  The incision was extended laterally with Mayo scissors. The inferior aspect of the  fascial incision was grasped with Kocher clamps, elevated and the rectus muscles were dissected off both bluntly and sharply.  Attention was then turned to the anterior portion which in similar fashion was grasped with Kocher clamps, elevated, and the rectus muscles were dissected off both bluntly and sharply.  Midline was easily identified and the peritoneum was entered bluntly.  The peritoneum incision was extended superiorly and inferiorly with good visualization of the bladder.  The Alexis skin retractor was placed after carefully making sure no bowel was entrapped.  The uterus was then inspected.  The vesicouterine peritoneum was easily identified, tented up with smooth pickups and the bladder flap was created both digitally and sharply. Uterus was incised in a transverse fashion.  Baby A was easily delivered from vertex presentation.  Baby B was noted to be double footling breech.  The feet were grasped and the bag was ruptured.  Infant was easily delivered with breech extraction technique.  Nose and mouth were suctioned on the field.  Cord was clamped and cut.  Infant was handed off to awaiting pediatric staff.  The uterus was cleared of all clot and debris.  The placenta was expressed and sent to pathology.  The uterine incision was closed with two layers of #0 Monocryl, first of  which was running locked and second was an imbricating layer.  Hemostasis was assured.  Copious pelvic irrigation was performed.  The tubes were identified out to the fimbriated end including the paratubal cyst and a partial salpingectomy was performed, doubly ligating with #0 plain gut bilaterally.  Both were noted to be hemostatic.  The Alexis retractor was removed.  The peritoneum was reapproximated using 2-0 Vicryl in a running fashion.  Subfascial planes were inspected, found to be hemostatic.  The fascia was then closed with #0 Vicryl overlapping from either side and overlapping in the midline.  The  subcuticular adipose layer was made hemostatic with Bovie cautery, copiously irrigated, and the dead space was closed with 3-0 plain gut.  The skin was closed with 4-0 Vicryl in a running subcuticular fashion.  Benzoin and Steri-Strips were applied.  Sponge, lap, and needle counts were correct x2 per the operating room staff.  The patient tolerated the procedure well.     Sherron Monday, MD     JB/MEDQ  D:  10/06/2012  T:  10/06/2012  Job:  [id number]

## 2012-10-06 NOTE — Transfer of Care (Signed)
Immediate Anesthesia Transfer of Care Note  Patient: Rachel [name]  Procedure(s) Performed: Procedure(s) with comments: CESAREAN SECTION (N/A) - PRIMARY C/S-TWINS BILATERAL SALPINGECTOMY (Bilateral)  Patient Location: PACU  Anesthesia Type:Spinal  Level of Consciousness: awake, alert  and oriented  Airway & Oxygen Therapy: Patient Spontanous Breathing  Post-op Assessment: Report given to PACU RN and Post -op Vital signs reviewed and stable  Post vital signs: Reviewed and stable  Complications: No apparent anesthesia complications

## 2012-10-06 NOTE — Anesthesia Postprocedure Evaluation (Signed)
  Anesthesia Post Note  Patient: Rachel [name]  Procedure(s) Performed: Procedure(s) (LRB): CESAREAN SECTION (N/A) BILATERAL SALPINGECTOMY (Bilateral)  Anesthesia type: Spinal  Patient location: PACU  Post pain: Pain level controlled  Post assessment: Post-op Vital signs reviewed  Last Vitals:  Filed Vitals:   10/06/12 1145  BP: 140/68  Pulse: 67  Temp:   Resp: 25    Post vital signs: Reviewed  Level of consciousness: awake  Complications: No apparent anesthesia complications

## 2012-10-06 NOTE — Interval H&P Note (Signed)
History and Physical Interval Note:  10/06/2012 9:06 AM  Rachel [name]  has presented today for surgery, with the diagnosis of breech presentation-TWINS;desires sterilization [phone]  The various methods of treatment have been discussed with the patient and family. After consideration of risks, benefits and other options for treatment, the patient has consented to  Procedure(s) with comments: CESAREAN SECTION (N/A) - PRIMARY C/S-TWINS BILATERAL SALPINGECTOMY (Bilateral) as a surgical intervention .  The patient's history has been reviewed, patient examined, no change in status, stable for surgery.  I have reviewed the patient's chart and labs.  Questions were answered to the patient's satisfaction.  Again confirmed permanence of BTL by salpingectomy.  [place] confirms, vtx, breech/transverse.     BOVARD,Francies Inch

## 2012-10-06 NOTE — Anesthesia Procedure Notes (Signed)
Spinal  Patient location during procedure: OR Start time: 10/06/2012 9:35 AM Staffing Anesthesiologist: Angus Seller., Harrell Gave. Performed by: anesthesiologist  Preanesthetic Checklist Completed: patient identified, site marked, surgical consent, pre-op evaluation, timeout performed, IV checked, risks and benefits discussed and monitors and equipment checked Spinal Block Patient position: sitting Prep: DuraPrep Patient monitoring: heart rate, cardiac monitor, continuous pulse ox and blood pressure Approach: midline Location: L3-4 Injection technique: single-shot Needle Needle type: Sprotte  Needle gauge: 24 G Needle length: 9 cm Assessment Sensory level: T4 Additional Notes Patient identified.  Risk benefits discussed including failed block, incomplete pain control, headache, nerve damage, paralysis, blood pressure changes, nausea, vomiting, reactions to medication both toxic or allergic, and postpartum back pain.  Patient expressed understanding and wished to proceed.  All questions were answered.  Sterile technique used throughout procedure.  CSF was clear.  No parasthesia or other complications.  Please see nursing notes for vital signs.

## 2012-10-06 NOTE — Brief Op Note (Signed)
10/06/2012  10:32 AM  PATIENT:  Sandralee T Rosser  27 y.o. female  PRE-OPERATIVE DIAGNOSIS:  vtx/breech presentation-TWINS;desires sterilization [phone]  POST-OPERATIVE DIAGNOSIS:  vtx/breech presentation-  37weeksTWINS;desires sterilization  PROCEDURE:  Procedure(s) with comments: CESAREAN SECTION (N/A) - PRIMARY C/S-TWINS BILATERAL SALPINGECTOMY (Bilateral)  SURGEON:  Surgeon(s) and Role: Panel 1:    * Sherron Monday, MD - Primary    * Bing Plume, MD - Assisting  Panel 2:    * Sherron Monday, MD - Primary  ANESTHESIA:   spinal  EBL:  Total I/O In: 1000 [I.V.:1000] Out: 900 [Urine:200; Blood:700]  FINDINGS: viable female infant at 9:46 - vtx presentation, apgars 9/9, viable female infant at 9:48 - breech extraction, apgars 9/9; nl uterus, tubes and ovaries, ovaries with B peritubal cysts.    BLOOD ADMINISTERED:none  DRAINS: Urinary Catheter (Foley)   LOCAL MEDICATIONS USED:  NONE  SPECIMEN:  Source of Specimen:  Placenta, B tubal segments  DISPOSITION OF SPECIMEN:  PATHOLOGY   COUNTS:  YES  TOURNIQUET:  * No tourniquets in log *  DICTATION: .Other Dictation: Dictation Number [id number]  PLAN OF CARE: Admit to inpatient   PATIENT DISPOSITION:  PACU - hemodynamically stable.   Delay start of Pharmacological VTE agent (>24hrs) due to surgical blood loss or risk of bleeding: not applicable

## 2012-10-07 ENCOUNTER — Encounter (HOSPITAL_COMMUNITY): Payer: Self-pay | Admitting: Obstetrics and Gynecology

## 2012-10-07 LAB — CBC
HCT: 28.6 % — ABNORMAL LOW (ref 36.0–46.0)
MCHC: 32.9 g/dL (ref 30.0–36.0)
MCV: 80.8 fL (ref 78.0–100.0)
Platelets: 145 10*3/uL — ABNORMAL LOW (ref 150–400)
RDW: 14.8 % (ref 11.5–15.5)

## 2012-10-07 NOTE — Progress Notes (Signed)
Patient was referred for history of depression/anxiety.  * Referral screened out by Clinical Social Worker because none of the following criteria appear to apply:  ~ History of anxiety/depression during this pregnancy, or of post-partum depression.  ~ Diagnosis of anxiety and/or depression within last 3 years  ~ History of depression due to pregnancy loss/loss of child  OR  * Patient's symptoms currently being treated with medication and/or therapy.  Please contact the Clinical Social Worker if needs arise, or by the patient's request.  Pt told CSW that situational after her father passed away, 2 years ago. She participated in grief counseling & reports that she fine now.      

## 2012-10-07 NOTE — Progress Notes (Signed)
Subjective: Postpartum Day 1: Cesarean Delivery Patient reports incisional pain and tolerating PO.  Pain controlled, nl lochia  Objective: Vital signs in last 24 hours: Temp:  [97.3 F (36.3 C)-99 F (37.2 C)] 97.7 F (36.5 C) (02/21 0627) Pulse Rate:  [64-98] 66 (02/21 0627) Resp:  [10-25] 18 (02/21 0627) BP: (130-158)/(68-96) 147/90 mmHg (02/21 0627) SpO2:  [95 %-100 %] 95 % (02/21 1610)  Physical Exam:  General: alert and no distress Lochia: appropriate Uterine Fundus: firm Incision: healing well DVT Evaluation: No evidence of DVT seen on physical exam.   Recent Labs  10/06/12 0835 10/07/12 0540  HGB 10.0* 9.4*  HCT 30.5* 28.6*    Assessment/Plan: Status post Cesarean section. Doing well postoperatively.  Continue current care.  [name],[name] 10/07/2012, 7:52 AM

## 2012-10-08 NOTE — Discharge Summary (Signed)
Obstetric Discharge Summary Reason for Admission: cesarean section Prenatal Procedures: none Intrapartum Procedures: cesarean: low cervical, transverse Postpartum Procedures: bilateral partial salpingectomy Complications-Operative and Postpartum: none Hemoglobin  Date Value Range Status  10/07/2012 9.4* 12.0 - 15.0 g/dL Final     HCT  Date Value Range Status  10/07/2012 28.6* 36.0 - 46.0 % Final    Physical Exam:  General: alert and cooperative Lochia: appropriate Uterine Fundus: firm Incision: C/D/I   Discharge Diagnoses: Term Pregnancy-delivered                                         Discordant twins vertex/breech Discharge Information: Date: 10/09/2012 Activity: pelvic rest Diet: routine Medications: Ibuprofen and Percocet Condition: improved Instructions: refer to practice specific booklet Discharge to: home Follow-up Information   Follow up with BOVARD,JODY, MD. Schedule an appointment as soon as possible for a visit in 2 weeks. (incision check)    Contact information:   [address] [place] [address] [phone]       Newborn Data:   Rachel [name], Rachel [name] [[id number]]  Live born female  Birth Weight: 4 lb 14.3 oz (2220 g) APGAR: 9, 9   Rachel [name], Rachel [name] [[id number]]  Live born female  Birth Weight: 5 lb 2.2 oz (2330 g) APGAR: 9, 9  Home with mother.  Oliver Pila 10/09/2012, 10:53 AM

## 2012-10-08 NOTE — Progress Notes (Signed)
Subjective: Postpartum Day 2: Cesarean Delivery Patient reports tolerating PO, + flatus and no problems voiding.    Objective: Vital signs in last 24 hours: Temp:  [98.1 F (36.7 C)-98.3 F (36.8 C)] 98.1 F (36.7 C) (02/22 0636) Pulse Rate:  [64-71] 64 (02/22 0636) Resp:  [18] 18 (02/22 0636) BP: (129-153)/(78-94) 153/94 mmHg (02/22 0636)  Physical Exam:  General: alert and cooperative Lochia: appropriate Uterine Fundus: firm Incision: C/D/I    Recent Labs  10/06/12 0835 10/07/12 0540  HGB 10.0* 9.4*  HCT 30.5* 28.6*    Assessment/Plan: Status post Cesarean section. Doing well postoperatively.  Continue current care. Plan possible d/c tomorrow if peds feels babies ok to go. Oliver Pila 10/08/2012, 9:46 AM

## 2012-10-09 MED ORDER — OXYCODONE-ACETAMINOPHEN 5-325 MG PO TABS: 1.0000 | ORAL_TABLET | ORAL | Status: DC | PRN

## 2012-10-09 MED ORDER — IBUPROFEN 800 MG PO TABS: 800.0000 mg | ORAL_TABLET | Freq: Three times a day (TID) | ORAL | Status: DC

## 2012-10-09 NOTE — Progress Notes (Signed)
Subjective: Postpartum Day 3 Cesarean Delivery Patient reports tolerating PO, + flatus and no problems voiding.    Objective: Vital signs in last 24 hours: Temp:  [98.1 F (36.7 C)-98.2 F (36.8 C)] 98.2 F (36.8 C) (02/23 0545) Pulse Rate:  [66] 66 (02/23 0545) Resp:  [18-20] 20 (02/23 0545) BP: (133-149)/(85-92) 133/85 mmHg (02/23 0545)  Physical Exam:  General: alert and cooperative Lochia: appropriate Uterine Fundus: firm Incision: healing well  Recent Labs  10/07/12 0540  HGB 9.4*  HCT 28.6*    Assessment/Plan: Status post Cesarean section. Doing well postoperatively.  Discharge home with standard precautions and return to clinic in 2 weeks for incision check.  Rachel [name] 10/09/2012, 10:50 AM

## 2012-10-16 ENCOUNTER — Other Ambulatory Visit: Payer: Self-pay | Admitting: Obstetrics and Gynecology

## 2012-10-16 ENCOUNTER — Observation Stay (HOSPITAL_COMMUNITY)
Admission: AD | Admit: 2012-10-16 | Discharge: 2012-10-19 | Disposition: A | Discharge status: Home / Self Care | Payer: Managed Care, Other (non HMO) | Source: Ambulatory Visit | Attending: Obstetrics and Gynecology | Admitting: Obstetrics and Gynecology

## 2012-10-16 ENCOUNTER — Encounter (HOSPITAL_COMMUNITY): Payer: Self-pay | Admitting: *Deleted

## 2012-10-16 DIAGNOSIS — O139 Gestational [pregnancy-induced] hypertension without significant proteinuria, unspecified trimester: Secondary | ICD-10-CM

## 2012-10-16 DIAGNOSIS — Z98891 History of uterine scar from previous surgery: Secondary | ICD-10-CM

## 2012-10-16 DIAGNOSIS — O30049 Twin pregnancy, dichorionic/diamniotic, unspecified trimester: Secondary | ICD-10-CM

## 2012-10-16 DIAGNOSIS — O3093 Multiple gestation, unspecified, third trimester: Secondary | ICD-10-CM

## 2012-10-16 DIAGNOSIS — O135 Gestational [pregnancy-induced] hypertension without significant proteinuria, complicating the puerperium: Principal | ICD-10-CM | POA: Insufficient documentation

## 2012-10-16 HISTORY — DX: Unspecified abnormal cytological findings in specimens from cervix uteri: R87.619

## 2012-10-16 HISTORY — DX: Reserved for concepts with insufficient information to code with codable children: IMO0002

## 2012-10-16 HISTORY — DX: Gestational (pregnancy-induced) hypertension without significant proteinuria, unspecified trimester: O13.9

## 2012-10-16 LAB — URINALYSIS, ROUTINE W REFLEX MICROSCOPIC
Bilirubin Urine: NEGATIVE
Glucose, UA: NEGATIVE mg/dL
Specific Gravity, Urine: <1.005 — ABNORMAL LOW (ref 1.005–1.030)
Urobilinogen, UA: 0.2 mg/dL (ref 0.0–1.0)
pH: 6 (ref 5.0–8.0)

## 2012-10-16 LAB — COMPREHENSIVE METABOLIC PANEL
ALT: 11 U/L (ref 0–35)
Alkaline Phosphatase: 111 U/L (ref 39–117)
BUN: 6 mg/dL (ref 6–23)
Chloride: 103 mEq/L (ref 96–112)
GFR calc Af Amer: >90 mL/min (ref >90)
Glucose, Bld: 60 mg/dL — ABNORMAL LOW (ref 70–99)
Potassium: 3.7 mEq/L (ref 3.5–5.1)
Sodium: 137 mEq/L (ref 135–145)
Total Bilirubin: 0.4 mg/dL (ref 0.3–1.2)
Total Protein: 6.3 g/dL (ref 6.0–8.3)

## 2012-10-16 LAB — CBC WITH DIFFERENTIAL/PLATELET
Basophils Relative: 0 % (ref 0–1)
HCT: 34 % — ABNORMAL LOW (ref 36.0–46.0)
Hemoglobin: 11.4 g/dL — ABNORMAL LOW (ref 12.0–15.0)
Lymphocytes Relative: 30 % (ref 12–46)
Lymphs Abs: 1.4 10*3/uL (ref 0.7–4.0)
MCHC: 33.5 g/dL (ref 30.0–36.0)
Monocytes Absolute: 0.4 10*3/uL (ref 0.1–1.0)
Monocytes Relative: 9 % (ref 3–12)
Neutro Abs: 2.7 10*3/uL (ref 1.7–7.7)
Neutrophils Relative %: 59 % (ref 43–77)
RBC: 4.25 MIL/uL (ref 3.87–5.11)
WBC: 4.6 10*3/uL (ref 4.0–10.5)

## 2012-10-16 LAB — URINE MICROSCOPIC-ADD ON

## 2012-10-16 MED ORDER — HYDRALAZINE HCL 20 MG/ML IJ SOLN
10.0000 mg | INTRAMUSCULAR | Status: DC | PRN
  Administered 2012-10-17 – 2012-10-18 (×3): 10 mg via INTRAVENOUS
  Filled 2012-10-16 (×3): qty 1

## 2012-10-16 MED ORDER — LACTATED RINGERS IV SOLN
INTRAVENOUS | Status: DC
  Administered 2012-10-16: 21:00:00 via INTRAVENOUS

## 2012-10-16 MED ORDER — IBUPROFEN 600 MG PO TABS
600.0000 mg | ORAL_TABLET | Freq: Four times a day (QID) | ORAL | Status: DC | PRN
  Administered 2012-10-17 – 2012-10-18 (×4): 600 mg via ORAL
  Filled 2012-10-16 (×5): qty 1

## 2012-10-16 MED ORDER — HYDRALAZINE HCL 20 MG/ML IJ SOLN: 5.0000 mg | Freq: Once | INTRAMUSCULAR | Status: DC

## 2012-10-16 MED ORDER — PRENATAL MULTIVITAMIN CH
1.0000 | ORAL_TABLET | Freq: Every day | ORAL | Status: DC
  Administered 2012-10-17 – 2012-10-18 (×2): 1 via ORAL
  Filled 2012-10-16 (×2): qty 1

## 2012-10-16 MED ORDER — HYDRALAZINE HCL 20 MG/ML IJ SOLN
5.0000 mg | Freq: Once | INTRAMUSCULAR | Status: AC
  Administered 2012-10-16: 5 mg via INTRAVENOUS

## 2012-10-16 MED ORDER — LABETALOL HCL 100 MG PO TABS
200.0000 mg | ORAL_TABLET | Freq: Once | ORAL | Status: AC
  Administered 2012-10-16: 200 mg via ORAL
  Filled 2012-10-16: qty 2

## 2012-10-16 MED ORDER — LABETALOL HCL 200 MG PO TABS
200.0000 mg | ORAL_TABLET | Freq: Two times a day (BID) | ORAL | Status: DC
  Administered 2012-10-16 – 2012-10-17 (×2): 200 mg via ORAL
  Filled 2012-10-16 (×4): qty 1

## 2012-10-16 MED ORDER — OXYCODONE-ACETAMINOPHEN 5-325 MG PO TABS
1.0000 | ORAL_TABLET | ORAL | Status: DC | PRN
  Administered 2012-10-17 (×4): 1 via ORAL
  Filled 2012-10-16 (×4): qty 1

## 2012-10-16 MED ORDER — HYDRALAZINE HCL 20 MG/ML IJ SOLN
5.0000 mg | Freq: Once | INTRAMUSCULAR | Status: AC
  Administered 2012-10-16: 5 mg via INTRAVENOUS
  Filled 2012-10-16: qty 1

## 2012-10-16 MED ORDER — SODIUM CHLORIDE 0.9 % IV SOLN
INTRAVENOUS | Status: DC
  Administered 2012-10-16: 19:00:00 via INTRAVENOUS

## 2012-10-16 NOTE — Progress Notes (Signed)
I have examined the pt and reviewed the admission note. The pt does not have preeclampsia but she has uncontrolled HBP. She has been given apresoline IV and she will be admitted for increased doses of labetalol and prn IV apresoline. She has a generalized headache but all of her labs are normal including platelet count, SGOT/PT, creatinine LDH and uric acid. There is no protein in her urine

## 2012-10-16 NOTE — MAU Provider Note (Signed)
History     CSN: [id number]  Arrival date and time: 10/16/12 1457   First Provider Initiated Contact with Patient 10/16/12 1539      No chief complaint on file.  HPI Rachel [name] is 27 y.o. G2P2003 delivered twins at 37 weeks by C-Section 10/06/12. Patient of Dr. Emeline Darling.  Hx of preterm labor and shortened cervix-medically treated.  Denies elevated blood pressure or sxs of pre-eclampsia during pregnancy.  Community nurse saw her on Friday 2/28 and noted elevated blood pressure.  The nurse called the office to report.  They called her back at 5:30 to tell her they called in Rx for Labetalol bid.  Took first dose at 8pm on 2/28.   She was instructed to come in here over the weekend for labs since she was unable to come in office on Monday because she had an appointment.    Denies chest pain, swelling in her feet or face.  Has a headache that began yesterday at 3am.  Lasted until 6am.  She had taken Ibuprofen, did not relieve sxs.  Blood pressure here is elevated 176/107.  Past Medical History  Diagnosis Date  . Hypertension   . Pregnancy induced hypertension   . Shortness of breath     with bronchitis  . GERD (gastroesophageal reflux disease)     pregnancy related  . Anemia     1st pregnancy  . 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 10/05/2012  . Multiple gestation with malpresentation of one fetus or more 10/05/2012  . S/P cesarean section 10/06/2012  . Abnormal Pap smear     Past Surgical History  Procedure Laterality Date  . Wisdom tooth extraction    . Cesarean section N/A 10/06/2012    Procedure: CESAREAN SECTION;  Surgeon: Sherron Monday, MD;  Location: [facility];  Service: Obstetrics;  Laterality: N/A;  PRIMARY C/S-TWINS  . Bilateral salpingectomy Bilateral 10/06/2012    Procedure: BILATERAL SALPINGECTOMY;  Surgeon: Sherron Monday, MD;  Location: [facility];  Service: Gynecology;  Laterality: Bilateral;    Family History  Problem Relation Age of Onset  . Hypertension  Mother   . Hypertension Father     History  Substance Use Topics  . Smoking status: Former Smoker    Types: Cigarettes    Quit date: 07/18/2009  . Smokeless tobacco: Not on file  . Alcohol Use: Yes     Comment: occasional but not while pregnant    Allergies: No Known Allergies  Prescriptions prior to admission  Medication Sig Dispense Refill  . ibuprofen (ADVIL,MOTRIN) 800 MG tablet Take 1 tablet (800 mg total) by mouth every 8 (eight) hours.  30 tablet  1  . labetalol (NORMODYNE) 100 MG tablet Take 100 mg by mouth 2 (two) times daily.      Marland Kitchen oxyCODONE-acetaminophen (PERCOCET/ROXICET) 5-325 MG per tablet Take 1-2 tablets by mouth every 4 (four) hours as needed.  30 tablet  0  . Prenatal Vit-Fe Fumarate-FA (PRENATAL VITAMINS) 28-0.8 MG TABS Take 1 tablet by mouth daily.  30 tablet  5    Review of Systems  Constitutional: Negative.   Eyes: Positive for photophobia. Negative for blurred vision and double vision.  Cardiovascular: Negative for chest pain and leg swelling.  Gastrointestinal: Negative for abdominal pain.  Genitourinary: Negative.   Musculoskeletal:       Negative for edema  Neurological: Positive for headaches.   Physical Exam   Blood pressure 133/114, pulse 95, weight 168 lb (76.204 kg), last menstrual  period 01/21/2012, unknown if currently breastfeeding.  Physical Exam  Nursing note and vitals reviewed. Constitutional: She is oriented to person, place, and time. She appears well-developed and well-nourished. No distress.  HENT:  Head: Normocephalic.  Neck: Normal range of motion.  Cardiovascular: Normal rate.   Respiratory: Effort normal.  Genitourinary:  Not indicated  Neurological: She is alert and oriented to person, place, and time.  Skin: Skin is warm and dry.  Psychiatric: She has a normal mood and affect. Her behavior is normal.   Results for orders placed during the hospital encounter of 10/16/12 (from the past 24 hour(s))  CBC WITH  DIFFERENTIAL     Status: Abnormal   Collection Time    10/16/12  4:08 PM      Result Value Range   WBC 4.6  4.0 - 10.5 K/uL   RBC 4.25  3.87 - 5.11 MIL/uL   Hemoglobin 11.4 (*) 12.0 - 15.0 g/dL   HCT 16.1 (*) 09.6 - 04.5 %   MCV 80.0  78.0 - 100.0 fL   MCH 26.8  26.0 - 34.0 pg   MCHC 33.5  30.0 - 36.0 g/dL   RDW 40.9 (*) 81.1 - 91.4 %   Platelets 293  150 - 400 K/uL   Neutrophils Relative 59  43 - 77 %   Neutro Abs 2.7  1.7 - 7.7 K/uL   Lymphocytes Relative 30  12 - 46 %   Lymphs Abs 1.4  0.7 - 4.0 K/uL   Monocytes Relative 9  3 - 12 %   Monocytes Absolute 0.4  0.1 - 1.0 K/uL   Eosinophils Relative 2  0 - 5 %   Eosinophils Absolute 0.1  0.0 - 0.7 K/uL   Basophils Relative 0  0 - 1 %   Basophils Absolute 0.0  0.0 - 0.1 K/uL   Results for orders placed during the hospital encounter of 10/16/12 (from the past 24 hour(s))  CBC WITH DIFFERENTIAL     Status: Abnormal   Collection Time    10/16/12  4:08 PM      Result Value Range   WBC 4.6  4.0 - 10.5 K/uL   RBC 4.25  3.87 - 5.11 MIL/uL   Hemoglobin 11.4 (*) 12.0 - 15.0 g/dL   HCT 78.2 (*) 95.6 - 21.3 %   MCV 80.0  78.0 - 100.0 fL   MCH 26.8  26.0 - 34.0 pg   MCHC 33.5  30.0 - 36.0 g/dL   RDW 08.6 (*) 57.8 - 46.9 %   Platelets 293  150 - 400 K/uL   Neutrophils Relative 59  43 - 77 %   Neutro Abs 2.7  1.7 - 7.7 K/uL   Lymphocytes Relative 30  12 - 46 %   Lymphs Abs 1.4  0.7 - 4.0 K/uL   Monocytes Relative 9  3 - 12 %   Monocytes Absolute 0.4  0.1 - 1.0 K/uL   Eosinophils Relative 2  0 - 5 %   Eosinophils Absolute 0.1  0.0 - 0.7 K/uL   Basophils Relative 0  0 - 1 %   Basophils Absolute 0.0  0.0 - 0.1 K/uL  COMPREHENSIVE METABOLIC PANEL     Status: Abnormal   Collection Time    10/16/12  4:08 PM      Result Value Range   Sodium 137  135 - 145 mEq/L   Potassium 3.7  3.5 - 5.1 mEq/L   Chloride 103  96 - 112 mEq/L  CO2 25  19 - 32 mEq/L   Glucose, Bld 60 (*) 70 - 99 mg/dL   BUN 6  6 - 23 mg/dL   Creatinine, Ser 4.09   0.50 - 1.10 mg/dL   Calcium 8.6  8.4 - 81.1 mg/dL   Total Protein 6.3  6.0 - 8.3 g/dL   Albumin 3.0 (*) 3.5 - 5.2 g/dL   AST 15  0 - 37 U/L   ALT 11  0 - 35 U/L   Alkaline Phosphatase 111  39 - 117 U/L   Total Bilirubin 0.4  0.3 - 1.2 mg/dL   GFR calc non Af Amer >90  >90 mL/min   GFR calc Af Amer >90  >90 mL/min  URIC ACID     Status: None   Collection Time    10/16/12  4:08 PM      Result Value Range   Uric Acid, Serum 6.3  2.4 - 7.0 mg/dL  LACTATE DEHYDROGENASE     Status: None   Collection Time    10/16/12  4:08 PM      Result Value Range   LDH 205  94 - 250 U/L  URINALYSIS, ROUTINE W REFLEX MICROSCOPIC     Status: Abnormal   Collection Time    10/16/12  5:00 PM      Result Value Range   Color, Urine YELLOW  YELLOW   APPearance CLEAR  CLEAR   Specific Gravity, Urine <1.005 (*) 1.005 - 1.030   pH 6.0  5.0 - 8.0   Glucose, UA NEGATIVE  NEGATIVE mg/dL   Hgb urine dipstick LARGE (*) NEGATIVE   Bilirubin Urine NEGATIVE  NEGATIVE   Ketones, ur NEGATIVE  NEGATIVE mg/dL   Protein, ur NEGATIVE  NEGATIVE mg/dL   Urobilinogen, UA 0.2  0.0 - 1.0 mg/dL   Nitrite NEGATIVE  NEGATIVE   Leukocytes, UA MODERATE (*) NEGATIVE  URINE MICROSCOPIC-ADD ON     Status: Abnormal   Collection Time    10/16/12  5:00 PM      Result Value Range   Squamous Epithelial / LPF MANY (*) RARE   WBC, UA 11-20  <3 WBC/hpf   RBC / HPF 0-2  <3 RBC/hpf   Bacteria, UA MANY (*) RARE   MAU Course  Procedures  MDM 16:03  Reported to Dr. Ambrose Mantle, HPI, blood pressure reading.  Orders given for Cath UA, PIH labs and Labetalol 200mg  po now. Pt's BP continues to be elevated BP 176/105- Dr. Ambrose Mantle aware Will give       Apresoline 5mg  IV- pt's BP continued to be elevated Dr. Ambrose Mantle here to see pt- will admit 17:00 Care turned over to S. Chase Picket, NP  Assessment and Plan    KEY,EVE M 10/16/2012, 3:43 PM

## 2012-10-16 NOTE — MAU Note (Signed)
Community nurse came  to home on Feb 28th,after delivery on Feb 20th and discovered BP was 160/110.  The RN called pt MD and [facility] called pt to request pt return to hosp over the weekend for follow up labs. Pt states she also has had a headache for the past three days and has taken ibuprofen with some relief.  The last time she took this med was at 2:45 today.

## 2012-10-17 LAB — COMPREHENSIVE METABOLIC PANEL
ALT: 10 U/L (ref 0–35)
BUN: 11 mg/dL (ref 6–23)
Calcium: 8.6 mg/dL (ref 8.4–10.5)
GFR calc Af Amer: >90 mL/min (ref >90)
Glucose, Bld: 78 mg/dL (ref 70–99)
Sodium: 141 mEq/L (ref 135–145)
Total Protein: 5.9 g/dL — ABNORMAL LOW (ref 6.0–8.3)

## 2012-10-17 LAB — URIC ACID: Uric Acid, Serum: 6.1 mg/dL (ref 2.4–7.0)

## 2012-10-17 LAB — CBC
Hemoglobin: 11.1 g/dL — ABNORMAL LOW (ref 12.0–15.0)
MCH: 26.5 pg (ref 26.0–34.0)
MCHC: 32.7 g/dL (ref 30.0–36.0)
RDW: 16 % — ABNORMAL HIGH (ref 11.5–15.5)

## 2012-10-17 MED ORDER — LABETALOL HCL 300 MG PO TABS
300.0000 mg | ORAL_TABLET | Freq: Two times a day (BID) | ORAL | Status: DC
  Administered 2012-10-17 – 2012-10-18 (×2): 300 mg via ORAL
  Filled 2012-10-17 (×4): qty 1

## 2012-10-17 MED ORDER — HYDRALAZINE HCL 20 MG/ML IJ SOLN
10.0000 mg | Freq: Once | INTRAMUSCULAR | Status: AC
  Administered 2012-10-17: 10 mg via INTRAVENOUS
  Filled 2012-10-17: qty 1

## 2012-10-17 NOTE — Plan of Care (Signed)
Problem: Consults Goal: General Medical Patient Education See Patient Education Module for specific education.  Outcome: Progressing Readmit after c/s-twins 10/06/12- elevated BPs

## 2012-10-17 NOTE — Progress Notes (Signed)
Ur chart review completed.  

## 2012-10-17 NOTE — Progress Notes (Signed)
Patient ID: Rachel [name], female   DOB: 1986/03/21, 27 y.o.   MRN: [id number] Pt states HA slightly improved. BP may be improving Labs are normal Required hydralazine 10 mg at 1:15 AM

## 2012-10-18 LAB — URINE CULTURE: Culture: NO GROWTH

## 2012-10-18 MED ORDER — LABETALOL HCL 100 MG PO TABS
100.0000 mg | ORAL_TABLET | Freq: Once | ORAL | Status: AC
  Administered 2012-10-18: 100 mg via ORAL
  Filled 2012-10-18: qty 1

## 2012-10-18 MED ORDER — LABETALOL HCL 300 MG PO TABS
300.0000 mg | ORAL_TABLET | Freq: Three times a day (TID) | ORAL | Status: DC
  Filled 2012-10-18 (×2): qty 1

## 2012-10-18 MED ORDER — LABETALOL HCL 300 MG PO TABS
300.0000 mg | ORAL_TABLET | Freq: Three times a day (TID) | ORAL | Status: DC
  Administered 2012-10-18: 300 mg via ORAL
  Filled 2012-10-18 (×3): qty 1

## 2012-10-18 MED ORDER — AMLODIPINE BESYLATE 5 MG PO TABS
5.0000 mg | ORAL_TABLET | Freq: Every day | ORAL | Status: DC
  Administered 2012-10-18 – 2012-10-19 (×2): 5 mg via ORAL
  Filled 2012-10-18 (×2): qty 1

## 2012-10-18 MED ORDER — LABETALOL HCL 200 MG PO TABS
400.0000 mg | ORAL_TABLET | Freq: Three times a day (TID) | ORAL | Status: DC
  Administered 2012-10-19 (×2): 400 mg via ORAL
  Filled 2012-10-18 (×2): qty 2

## 2012-10-18 NOTE — Progress Notes (Signed)
Patient ID: Rachel [name], female   DOB: September 12, 1985, 27 y.o.   MRN: [id number]  Pt delivered twins by LTCS, HTN, no proteinuria, nl LFTs Likely CHTN vs PIH  Pt states some HA, no blurry vision, pain OK. Re incision   150-160/ 90's this AM, Systolic has been as high as 187, diastolic 114 gen NAD CV RRR Lungs CTAB Abd soft, NT, ND +BS Inc C/D/I Ext sym, NT  26yo s/p LTCS - twins with HTN Labetalol 300mg  bid Norvasc 5mg  qd, d/w pharmacy Close monitoring

## 2012-10-18 NOTE — Consult Note (Signed)
I briefly met with this mom of twins, who was readmitted to the hospital herself, and has her babies with her. She was reported to be breast feeding, so I asked her how this was going. Mom reports she has been putting each of the babies to breast about twice a day only, and otherwise  formula  feeding. When I explained that doing this will not maintain her milk supply, she claimed then to have been breast feeding more often than twice a day. I explained how her supply is dependent on supply and demand, and frequsnt stimulation, especially with twins. The babies are now term, over 38 weeks corrected gestation. I told mom to call for any questions/concerns, and.or help with lactation. I felt she was not intending to fully breast feed, and I would offer my help to her, as she  needed.

## 2012-10-18 NOTE — Progress Notes (Signed)
Patient ID: Rachel [name], female   DOB: March 25, 1986, 27 y.o.   MRN: [id number]  Continued intermittent very elevated BP  Will increase labetalol to 300 tid, norvasc 5mg  qday  PIH likely d/c in AM

## 2012-10-19 ENCOUNTER — Encounter (HOSPITAL_COMMUNITY): Payer: Self-pay | Admitting: Obstetrics and Gynecology

## 2012-10-19 DIAGNOSIS — O139 Gestational [pregnancy-induced] hypertension without significant proteinuria, unspecified trimester: Secondary | ICD-10-CM

## 2012-10-19 HISTORY — DX: Gestational (pregnancy-induced) hypertension without significant proteinuria, unspecified trimester: O13.9

## 2012-10-19 MED ORDER — LABETALOL HCL 200 MG PO TABS: 400.0000 mg | ORAL_TABLET | Freq: Three times a day (TID) | ORAL | Status: DC

## 2012-10-19 MED ORDER — AMLODIPINE BESYLATE 5 MG PO TABS: 5.0000 mg | ORAL_TABLET | Freq: Every day | ORAL | Status: DC

## 2012-10-19 NOTE — Progress Notes (Signed)
Patient ID: Rachel [name], female   DOB: 14-Oct-1985, 27 y.o.   MRN: [id number]   No c/o's.  Feeling better.  AF BP 130-180/80-113 (160/90) gen NAD CV RRR Lungs CTAB Abd soft, NT, ND, +BS Ext sym, NT  A&P 26yo s/p LTCS with HTN Labetalol 400mg  tid Norvasc 5 mg qd D/c home, f/u 1 week for BP check (has appt tues for incision check)

## 2012-10-19 NOTE — Progress Notes (Signed)
Discharge instructions reviewed with patient.  Patient states understanding of home care, signs/symptoms to seek help for or report to MD, medications, activity and return MD office visit.  No home equipment needed.  Patient discharged in stable condition. Ambulated with staff without incident and discharged home.

## 2012-10-19 NOTE — Discharge Summary (Signed)
Physician Discharge Summary  Patient ID: Rachel [name] MRN: [id number] DOB/AGE: 05/14/1986 26 y.o.  Admit date: 10/16/2012 Discharge date: 10/19/2012  Admission Diagnoses: HTN, s/p LTCS  Discharge Diagnoses:  Principal Problem:   Transient hypertension of pregnancy, with delivery   Discharged Condition: good  Hospital Course: admitted, neg PIH w/u, BP control  Consults: None  Significant Diagnostic Studies: labs: PIH, WNL  Treatments: antihypertensives and monitoring  Discharge Exam: Blood pressure 162/90, pulse 72, temperature 98.2 F (36.8 C), temperature source Oral, resp. rate 19, height 5\' 7"  (1.702 m), weight 76.885 kg (169 lb 8 oz), last menstrual period 01/21/2012, SpO2 98.00%, unknown if currently breastfeeding. General appearance: alert and no distress Resp: clear to auscultation bilaterally Cardio: regular rate and rhythm Extremities: extremities normal, atraumatic, no cyanosis or edema Incision/Wound:C/D/I  Disposition: 01-Home or Self Care     Medication List    ASK your doctor about these medications       ibuprofen 800 MG tablet  Commonly known as:  ADVIL,MOTRIN  Take 1 tablet (800 mg total) by mouth every 8 (eight) hours.     labetalol 100 MG tablet  Commonly known as:  NORMODYNE  Take 100 mg by mouth 2 (two) times daily.     oxyCODONE-acetaminophen 5-325 MG per tablet  Commonly known as:  PERCOCET/ROXICET  Take 1-2 tablets by mouth every 4 (four) hours as needed.     Prenatal Vitamins 28-0.8 MG Tabs  Take 1 tablet by mouth daily.           Follow-up Information   Schedule an appointment as soon as possible for a visit with BOVARD,JODY, MD. (Has appt 3/11 with Bovard)    Contact information:   [address] [place] [address] [phone]       Signed: BOVARD,JODY 10/19/2012, 8:29 AM

## 2014-03-07 ENCOUNTER — Ambulatory Visit (INDEPENDENT_AMBULATORY_CARE_PROVIDER_SITE_OTHER): Payer: BC Managed Care – PPO

## 2014-03-07 ENCOUNTER — Ambulatory Visit (INDEPENDENT_AMBULATORY_CARE_PROVIDER_SITE_OTHER): Payer: BC Managed Care – PPO | Admitting: Emergency Medicine

## 2014-03-07 DIAGNOSIS — Z043 Encounter for examination and observation following other accident: Secondary | ICD-10-CM

## 2014-03-07 DIAGNOSIS — S8010XA Contusion of unspecified lower leg, initial encounter: Secondary | ICD-10-CM

## 2014-03-07 DIAGNOSIS — S39012A Strain of muscle, fascia and tendon of lower back, initial encounter: Secondary | ICD-10-CM

## 2014-03-07 DIAGNOSIS — S335XXA Sprain of ligaments of lumbar spine, initial encounter: Secondary | ICD-10-CM

## 2014-03-07 DIAGNOSIS — M949 Disorder of cartilage, unspecified: Secondary | ICD-10-CM

## 2014-03-07 DIAGNOSIS — M899 Disorder of bone, unspecified: Secondary | ICD-10-CM

## 2014-03-07 DIAGNOSIS — M545 Low back pain, unspecified: Secondary | ICD-10-CM

## 2014-03-07 DIAGNOSIS — M898X5 Other specified disorders of bone, thigh: Secondary | ICD-10-CM

## 2014-03-07 DIAGNOSIS — S8012XA Contusion of left lower leg, initial encounter: Secondary | ICD-10-CM

## 2014-03-07 MED ORDER — OXYCODONE-ACETAMINOPHEN 5-325 MG PO TABS: 1.0000 | ORAL_TABLET | Freq: Three times a day (TID) | ORAL | Status: DC | PRN
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
## 2014-03-26 ENCOUNTER — Other Ambulatory Visit: Payer: Self-pay | Admitting: Orthopedic Surgery

## 2014-04-06 ENCOUNTER — Encounter (HOSPITAL_BASED_OUTPATIENT_CLINIC_OR_DEPARTMENT_OTHER): Payer: Self-pay | Admitting: *Deleted

## 2014-04-12 ENCOUNTER — Encounter (HOSPITAL_BASED_OUTPATIENT_CLINIC_OR_DEPARTMENT_OTHER): Payer: Self-pay | Admitting: Certified Registered"

## 2014-04-12 ENCOUNTER — Encounter (HOSPITAL_BASED_OUTPATIENT_CLINIC_OR_DEPARTMENT_OTHER): Payer: BC Managed Care – PPO | Admitting: Anesthesiology

## 2014-04-12 ENCOUNTER — Ambulatory Visit (HOSPITAL_BASED_OUTPATIENT_CLINIC_OR_DEPARTMENT_OTHER)
Admission: RE | Admit: 2014-04-12 | Discharge: 2014-04-12 | Disposition: A | Discharge status: Home / Self Care | Payer: BC Managed Care – PPO | Source: Ambulatory Visit | Attending: Orthopedic Surgery | Admitting: Orthopedic Surgery

## 2014-04-12 ENCOUNTER — Encounter (HOSPITAL_BASED_OUTPATIENT_CLINIC_OR_DEPARTMENT_OTHER)
Admission: RE | Disposition: A | Discharge status: Home / Self Care | Payer: Self-pay | Source: Ambulatory Visit | Attending: Orthopedic Surgery

## 2014-04-12 ENCOUNTER — Ambulatory Visit (HOSPITAL_BASED_OUTPATIENT_CLINIC_OR_DEPARTMENT_OTHER): Payer: BC Managed Care – PPO | Admitting: Anesthesiology

## 2014-04-12 DIAGNOSIS — Z87891 Personal history of nicotine dependence: Secondary | ICD-10-CM | POA: Diagnosis not present

## 2014-04-12 DIAGNOSIS — K219 Gastro-esophageal reflux disease without esophagitis: Secondary | ICD-10-CM | POA: Diagnosis not present

## 2014-04-12 DIAGNOSIS — I10 Essential (primary) hypertension: Secondary | ICD-10-CM | POA: Diagnosis not present

## 2014-04-12 DIAGNOSIS — G5600 Carpal tunnel syndrome, unspecified upper limb: Secondary | ICD-10-CM | POA: Diagnosis present

## 2014-04-12 HISTORY — PX: CARPAL TUNNEL RELEASE: SHX101

## 2014-04-12 LAB — POCT HEMOGLOBIN-HEMACUE: HEMOGLOBIN: 13.2 g/dL (ref 12.0–15.0)

## 2014-04-12 SURGERY — CARPAL TUNNEL RELEASE
Anesthesia: General | Site: Wrist | Laterality: Bilateral

## 2014-04-12 MED ORDER — LIDOCAINE HCL (PF) 1 % IJ SOLN
INTRAMUSCULAR | Status: AC
  Filled 2014-04-12: qty 25

## 2014-04-12 MED ORDER — MIDAZOLAM HCL 5 MG/5ML IJ SOLN
INTRAMUSCULAR | Status: DC | PRN
  Administered 2014-04-12: 2 mg via INTRAVENOUS

## 2014-04-12 MED ORDER — FENTANYL CITRATE 0.05 MG/ML IJ SOLN
INTRAMUSCULAR | Status: DC | PRN
  Administered 2014-04-12 (×2): 50 ug via INTRAVENOUS

## 2014-04-12 MED ORDER — CEFAZOLIN SODIUM-DEXTROSE 2-3 GM-% IV SOLR
INTRAVENOUS | Status: AC
  Filled 2014-04-12: qty 50

## 2014-04-12 MED ORDER — PROPOFOL 10 MG/ML IV BOLUS
INTRAVENOUS | Status: DC | PRN
  Administered 2014-04-12: 200 mg via INTRAVENOUS

## 2014-04-12 MED ORDER — FENTANYL CITRATE 0.05 MG/ML IJ SOLN
25.0000 ug | INTRAMUSCULAR | Status: DC | PRN
  Administered 2014-04-12: 25 ug via INTRAVENOUS
  Administered 2014-04-12: 50 ug via INTRAVENOUS
  Administered 2014-04-12: 25 ug via INTRAVENOUS

## 2014-04-12 MED ORDER — BUPIVACAINE HCL (PF) 0.25 % IJ SOLN
INTRAMUSCULAR | Status: DC | PRN
  Administered 2014-04-12: 12 mL

## 2014-04-12 MED ORDER — PROMETHAZINE HCL 25 MG/ML IJ SOLN
INTRAMUSCULAR | Status: AC
  Filled 2014-04-12: qty 1

## 2014-04-12 MED ORDER — THROMBIN 20000 UNITS EX KIT
PACK | CUTANEOUS | Status: AC
  Filled 2014-04-12: qty 1

## 2014-04-12 MED ORDER — MIDAZOLAM HCL 2 MG/2ML IJ SOLN: 1.0000 mg | INTRAMUSCULAR | Status: DC | PRN

## 2014-04-12 MED ORDER — HYDROCODONE-ACETAMINOPHEN 10-325 MG PO TABS: 1.0000 | ORAL_TABLET | Freq: Four times a day (QID) | ORAL | Status: DC | PRN

## 2014-04-12 MED ORDER — FENTANYL CITRATE 0.05 MG/ML IJ SOLN
INTRAMUSCULAR | Status: AC
  Filled 2014-04-12: qty 2

## 2014-04-12 MED ORDER — PROPOFOL 10 MG/ML IV BOLUS
INTRAVENOUS | Status: AC
  Filled 2014-04-12: qty 20

## 2014-04-12 MED ORDER — LIDOCAINE HCL (CARDIAC) 20 MG/ML IV SOLN
INTRAVENOUS | Status: DC | PRN
  Administered 2014-04-12: 80 mg via INTRAVENOUS

## 2014-04-12 MED ORDER — LACTATED RINGERS IV SOLN
INTRAVENOUS | Status: DC
  Administered 2014-04-12 (×2): via INTRAVENOUS

## 2014-04-12 MED ORDER — OXYCODONE HCL 5 MG/5ML PO SOLN: 5.0000 mg | Freq: Once | ORAL | Status: AC | PRN

## 2014-04-12 MED ORDER — ONDANSETRON HCL 4 MG/2ML IJ SOLN
INTRAMUSCULAR | Status: DC | PRN
  Administered 2014-04-12: 4 mg via INTRAVENOUS

## 2014-04-12 MED ORDER — FENTANYL CITRATE 0.05 MG/ML IJ SOLN: 50.0000 ug | INTRAMUSCULAR | Status: DC | PRN

## 2014-04-12 MED ORDER — OXYCODONE HCL 5 MG PO TABS
ORAL_TABLET | ORAL | Status: AC
  Filled 2014-04-12: qty 1

## 2014-04-12 MED ORDER — CHLORHEXIDINE GLUCONATE 4 % EX LIQD: 60.0000 mL | Freq: Once | CUTANEOUS | Status: DC

## 2014-04-12 MED ORDER — CEFAZOLIN SODIUM-DEXTROSE 2-3 GM-% IV SOLR: 2.0000 g | INTRAVENOUS | Status: DC

## 2014-04-12 MED ORDER — BUPIVACAINE HCL (PF) 0.25 % IJ SOLN
INTRAMUSCULAR | Status: AC
  Filled 2014-04-12: qty 150

## 2014-04-12 MED ORDER — CEFAZOLIN SODIUM-DEXTROSE 2-3 GM-% IV SOLR
2.0000 g | INTRAVENOUS | Status: AC
  Administered 2014-04-12: 2 g via INTRAVENOUS

## 2014-04-12 MED ORDER — FENTANYL CITRATE 0.05 MG/ML IJ SOLN
INTRAMUSCULAR | Status: AC
  Filled 2014-04-12: qty 4

## 2014-04-12 MED ORDER — ONDANSETRON HCL 4 MG/2ML IJ SOLN: 4.0000 mg | Freq: Four times a day (QID) | INTRAMUSCULAR | Status: DC | PRN

## 2014-04-12 MED ORDER — OXYCODONE HCL 5 MG PO TABS
5.0000 mg | ORAL_TABLET | Freq: Once | ORAL | Status: AC | PRN
  Administered 2014-04-12: 5 mg via ORAL

## 2014-04-12 MED ORDER — PROMETHAZINE HCL 25 MG/ML IJ SOLN
6.2500 mg | INTRAMUSCULAR | Status: DC | PRN
  Administered 2014-04-12: 6.25 mg via INTRAVENOUS

## 2014-04-12 MED ORDER — DEXAMETHASONE SODIUM PHOSPHATE 10 MG/ML IJ SOLN
INTRAMUSCULAR | Status: DC | PRN
  Administered 2014-04-12: 8 mg via INTRAVENOUS

## 2014-04-12 MED ORDER — MIDAZOLAM HCL 2 MG/2ML IJ SOLN
INTRAMUSCULAR | Status: AC
  Filled 2014-04-12: qty 2

## 2014-04-12 SURGICAL SUPPLY — 35 items
BLADE SURG 15 STRL LF DISP TIS (BLADE) ×2 IMPLANT
BLADE SURG 15 STRL SS (BLADE) ×2
BNDG COHESIVE 3X5 TAN STRL LF (GAUZE/BANDAGES/DRESSINGS) ×2 IMPLANT
BNDG ESMARK 4X9 LF (GAUZE/BANDAGES/DRESSINGS) ×2 IMPLANT
BNDG GAUZE ELAST 4 BULKY (GAUZE/BANDAGES/DRESSINGS) ×2 IMPLANT
CHLORAPREP W/TINT 26ML (MISCELLANEOUS) ×4 IMPLANT
CORDS BIPOLAR (ELECTRODE) ×2 IMPLANT
COVER MAYO STAND STRL (DRAPES) ×4 IMPLANT
COVER TABLE BACK 60X90 (DRAPES) ×2 IMPLANT
CUFF TOURNIQUET SINGLE 18IN (TOURNIQUET CUFF) ×4 IMPLANT
DRAPE EXTREMITY T 121X128X90 (DRAPE) ×4 IMPLANT
DRAPE SURG 17X23 STRL (DRAPES) ×4 IMPLANT
DRSG PAD ABDOMINAL 8X10 ST (GAUZE/BANDAGES/DRESSINGS) ×4 IMPLANT
GAUZE SPONGE 4X4 12PLY STRL (GAUZE/BANDAGES/DRESSINGS) ×4 IMPLANT
GAUZE XEROFORM 1X8 LF (GAUZE/BANDAGES/DRESSINGS) ×2 IMPLANT
GLOVE BIO SURGEON STRL SZ7.5 (GLOVE) ×2 IMPLANT
GLOVE BIOGEL PI IND STRL 8 (GLOVE) ×1 IMPLANT
GLOVE BIOGEL PI IND STRL 8.5 (GLOVE) ×1 IMPLANT
GLOVE BIOGEL PI INDICATOR 8 (GLOVE) ×1
GLOVE BIOGEL PI INDICATOR 8.5 (GLOVE) ×1
GLOVE SURG ORTHO 8.0 STRL STRW (GLOVE) ×2 IMPLANT
GOWN STRL REUS W/ TWL LRG LVL3 (GOWN DISPOSABLE) IMPLANT
GOWN STRL REUS W/ TWL XL LVL3 (GOWN DISPOSABLE) ×1 IMPLANT
GOWN STRL REUS W/TWL LRG LVL3 (GOWN DISPOSABLE)
GOWN STRL REUS W/TWL XL LVL3 (GOWN DISPOSABLE) ×3 IMPLANT
NEEDLE 27GAX1X1/2 (NEEDLE) ×2 IMPLANT
NS IRRIG 1000ML POUR BTL (IV SOLUTION) ×2 IMPLANT
PACK BASIN DAY SURGERY FS (CUSTOM PROCEDURE TRAY) ×2 IMPLANT
STOCKINETTE 4X48 STRL (DRAPES) ×4 IMPLANT
SUT VICRYL 4-0 PS2 18IN ABS (SUTURE) IMPLANT
SUT VICRYL RAPIDE 4/0 PS 2 (SUTURE) ×4 IMPLANT
SYR BULB 3OZ (MISCELLANEOUS) ×2 IMPLANT
SYR CONTROL 10ML LL (SYRINGE) ×2 IMPLANT
TOWEL OR 17X24 6PK STRL BLUE (TOWEL DISPOSABLE) ×2 IMPLANT
UNDERPAD 30X30 INCONTINENT (UNDERPADS AND DIAPERS) ×4 IMPLANT

## 2014-04-12 NOTE — Op Note (Signed)
Other Dictation: Dictation Number [id number]

## 2014-04-12 NOTE — Transfer of Care (Signed)
Immediate Anesthesia Transfer of Care Note  Patient: Rachel [name]  Procedure(s) Performed: Procedure(s): RIGHT CARPAL TUNNEL RELEASE, LEFT CARPAL TUNNEL RELEASE (Bilateral)  Patient Location: PACU  Anesthesia Type:General  Level of Consciousness: awake, sedated and responds to stimulation  Airway & Oxygen Therapy: Patient Spontanous Breathing and Patient connected to face mask oxygen  Post-op Assessment: Report given to PACU RN, Post -op Vital signs reviewed and stable and Patient moving all extremities  Post vital signs: Reviewed and stable  Complications: No apparent anesthesia complications

## 2014-04-12 NOTE — Anesthesia Procedure Notes (Signed)
Procedure Name: LMA Insertion Date/Time: 04/12/2014 8:38 AM Performed by: Curly [place] Pre-anesthesia Checklist: Patient identified, Emergency Drugs available, Suction available and Patient being monitored Patient Re-evaluated:Patient Re-evaluated prior to inductionOxygen Delivery Method: Circle System Utilized Preoxygenation: Pre-oxygenation with 100% oxygen Intubation Type: IV induction Ventilation: Mask ventilation without difficulty LMA: LMA inserted LMA Size: 4.0 Number of attempts: 1 Airway Equipment and Method: bite block Placement Confirmation: positive ETCO2 and breath sounds checked- equal and bilateral Tube secured with: Tape Dental Injury: Teeth and Oropharynx as per pre-operative assessment

## 2014-04-12 NOTE — Brief Op Note (Signed)
04/12/2014  9:38 AM  PATIENT:  Rachel [name]  28 y.o. female  PRE-OPERATIVE DIAGNOSIS:  BILATERAL CARPAL TUNNEL SYNDROME   POST-OPERATIVE DIAGNOSIS:  BILATERAL CARPAL TUNNEL SYNDROME   PROCEDURE:  Procedure(s): RIGHT CARPAL TUNNEL RELEASE, LEFT CARPAL TUNNEL RELEASE (Bilateral)  SURGEON:  Surgeon(s) and Role:    * Cindee Salt, MD - Primary  PHYSICIAN ASSISTANT:   ASSISTANTS: none   ANESTHESIA:   local and general  EBL:  Total I/O In: 1100 [I.V.:1100] Out: -   BLOOD ADMINISTERED:none  DRAINS: none   LOCAL MEDICATIONS USED:  BUPIVICAINE   SPECIMEN:  No Specimen  DISPOSITION OF SPECIMEN:  N/A  COUNTS:  YES  TOURNIQUET:   Total Tourniquet Time Documented: Upper Arm (N/A) - 18 minutes Upper Arm (N/A) - 17 minutes Total: Upper Arm (N/A) - 35 minutes   DICTATION: .Other Dictation: Dictation Number [id number]  PLAN OF CARE: Discharge to home after PACU  PATIENT DISPOSITION:  PACU - hemodynamically stable.

## 2014-04-12 NOTE — Anesthesia Postprocedure Evaluation (Signed)
Anesthesia Post Note  Patient: Rachel [name]  Procedure(s) Performed: Procedure(s) (LRB): RIGHT CARPAL TUNNEL RELEASE, LEFT CARPAL TUNNEL RELEASE (Bilateral)  Anesthesia type: General  Patient location: PACU  Post pain: Pain level controlled and Adequate analgesia  Post assessment: Post-op Vital signs reviewed, Patient's Cardiovascular Status Stable, Respiratory Function Stable, Patent Airway and Pain level controlled  Last Vitals:  Filed Vitals:   04/12/14 1000  BP: 117/66  Pulse: 85  Temp:   Resp: 20    Post vital signs: Reviewed and stable  Level of consciousness: awake, alert  and oriented  Complications: No apparent anesthesia complications

## 2014-04-12 NOTE — Discharge Instructions (Addendum)
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
## 2014-04-12 NOTE — Anesthesia Preprocedure Evaluation (Signed)
Anesthesia Evaluation  Patient identified by MRN, date of birth, ID band Patient awake    Reviewed: Allergy & Precautions, H&P , NPO status , Patient's Chart, lab work & pertinent test results  Airway Mallampati: II  Neck ROM: full    Dental   Pulmonary former smoker,          Cardiovascular hypertension,     Neuro/Psych    GI/Hepatic GERD-  ,  Endo/Other    Renal/GU      Musculoskeletal   Abdominal   Peds  Hematology   Anesthesia Other Findings   Reproductive/Obstetrics                           Anesthesia Physical Anesthesia Plan  ASA: II  Anesthesia Plan: MAC and Regional   Post-op Pain Management:    Induction: Intravenous  Airway Management Planned: Simple Face Mask  Additional Equipment:   Intra-op Plan:   Post-operative Plan:   Informed Consent: I have reviewed the patients History and Physical, chart, labs and discussed the procedure including the risks, benefits and alternatives for the proposed anesthesia with the patient or authorized representative who has indicated his/her understanding and acceptance.     Plan Discussed with: CRNA, Anesthesiologist and Surgeon  Anesthesia Plan Comments:         Anesthesia Quick Evaluation

## 2014-04-12 NOTE — Op Note (Signed)
Rachel [name], [name]               ACCOUNT NO.:  [id number]  MEDICAL RECORD NO.:  [id number]  LOCATION:                                 FACILITY:  PHYSICIAN:  Cindee Salt, M.D.            DATE OF BIRTH:  DATE OF PROCEDURE:  04/12/2014 DATE OF DISCHARGE:                              OPERATIVE REPORT   PREOPERATIVE DIAGNOSIS:  Bilateral carpal tunnel syndrome.  POSTOPERATIVE DIAGNOSIS:  Bilateral carpal tunnel syndrome.  OPERATION:  Release bilateral carpal tunnels.  SURGEON:  Cindee Salt, MD  ANESTHESIA:  General with local infiltration.  ANESTHESIOLOGIST:  Achille Rich, MD  HISTORY:  The patient is a 28 year old female with bilateral carpal tunnel, nerve conduction is positive, nonresponsive to conservative treatment.  She has elected to undergo bilateral release.  Pre, peri and postoperative course have been discussed along with risks and complications.  She is aware that there is no guarantee with the surgery, possibility of infection, recurrence of injury to arteries, nerves, tendons, incomplete relief of symptoms, and dystrophy.  In the preoperative area, the patient is seen, the extremity marked by both patient and surgeon, antibiotic given.  PROCEDURE IN DETAIL:  The patient was brought to the operating room, where a general anesthetic was carried out without difficulty under the direction of Dr. Chaney Malling.  She was prepped using ChloraPrep, supine position, right arm free.  A 3-minute dry time was allowed.  Time-out taken, confirming the patient and procedure.  The limb was exsanguinated with an Esmarch bandage.  Tourniquet placed on forearm, was inflated to 250 mmHg.  A longitudinal incision was made in the right palm, carried down through subcutaneous tissue.  Bleeders were electrocauterized. Palmar fascia was split.  Superficial palmar arch was identified.  The flexor tendon to the ring and little finger identified.  To the ulnar side of the median nerve,  carpal  retinaculum was incised with sharp dissection.  Right angle and Sewall retractor were placed between the skin and forearm fascia.  The fascia released for approximately 2 cm proximal to the wrist crease under direct vision.  The canal was explored.  Area of compression to the nerve was apparent.  The motor branch entered into muscle.  No further lesions were identified. Tenosynovial tissue was moderately thickened.  Wound was copiously irrigated with saline.  The skin then closed with subcuticular 4-0 Vicryl Rapide sutures after closure and the area was cleaned with alcohol and Dermabond applied.  A sterile compressive dressing was applied with the fingers free.  On deflation of the tourniquet, all fingers immediately pinked.  Her left side was then prepped and draped using ChloraPrep.  Again, in supine position, time-out again taken confirming the patient and procedure.  The limb exsanguinated with an Esmarch bandage.  Tourniquet on forearm was inflated to 250 mmHg.  A longitudinal incision made in the left palm, carried down through subcutaneous tissue.  Again, the palmar fascia was split.  Superficial palmar arch identified.  Flexor tendon to the ring and little finger identified.  To the ulnar side of the median nerve,  carpal retinaculum was incised with sharp dissection.  Right angle and Sewall retractor  placed between skin and forearm fascia.  The fascia released for approximately 2 cm proximal to the wrist crease under direct vision. This canal was explored.  Area of compression to the nerve again was noted along with the motor branch entered into muscle distally. Tenosynovial tissue was again moderately thickened.  The wound was irrigated with saline and closed with subcuticular 4-0 Vicryl Rapide. There was also Dermabond.  After cleansing the skin with alcohol, a local infiltration was given to each side after closure, 5 mL was used on each side with 0.25% bupivacaine without  epinephrine.  A sterile compressive dressing was applied to the left side.  On deflation of the tourniquet, all fingers immediately pinked.  She was taken to the recovery room for observation in satisfactory condition.  She will be discharged home to return to the [facility] in 1 week on Vicodin.          ______________________________ Cindee Salt, M.D.     GK/MEDQ  D:  04/12/2014  T:  04/12/2014  Job:  [id number]

## 2014-04-13 ENCOUNTER — Encounter (HOSPITAL_BASED_OUTPATIENT_CLINIC_OR_DEPARTMENT_OTHER): Payer: Self-pay | Admitting: Orthopedic Surgery

## 2014-05-31 ENCOUNTER — Ambulatory Visit (INDEPENDENT_AMBULATORY_CARE_PROVIDER_SITE_OTHER): Payer: BC Managed Care – PPO | Admitting: Family Medicine

## 2014-05-31 VITALS — BP 130/80 | HR 78 | Temp 98.2°F | Resp 16 | Ht 67.0 in | Wt 168.0 lb

## 2014-05-31 DIAGNOSIS — Z23 Encounter for immunization: Secondary | ICD-10-CM

## 2014-05-31 DIAGNOSIS — Z8742 Personal history of other diseases of the female genital tract: Secondary | ICD-10-CM

## 2014-05-31 DIAGNOSIS — Z0000 Encounter for general adult medical examination without abnormal findings: Secondary | ICD-10-CM

## 2014-05-31 LAB — LIPID PANEL
CHOLESTEROL: 124 mg/dL (ref 0–200)
HDL: 42 mg/dL (ref >39)
LDL Cholesterol: 70 mg/dL (ref 0–99)
TRIGLYCERIDES: 59 mg/dL (ref <150)
Total CHOL/HDL Ratio: 3 Ratio
VLDL: 12 mg/dL (ref 0–40)

## 2014-05-31 LAB — GLUCOSE, POCT (MANUAL RESULT ENTRY): POC Glucose: 89 mg/dl (ref 70–99)

## 2014-05-31 LAB — POCT GLYCOSYLATED HEMOGLOBIN (HGB A1C): Hemoglobin A1C: 5.2

## 2014-05-31 NOTE — Patient Instructions (Signed)
Follow up as discussed with previous provider for abnormal pap testing, as this needs to be repeated or other recommended evaluation.  Set up eye care provider and dentist appointments.  Exercise 150 minutes per week. You should receive a call or letter about your lab results within the next week to 10 days.   Keeping You Healthy  Get These Tests 1. Blood Pressure- Have your blood pressure checked once a year by your health care provider.  Normal blood pressure is 120/80. 2. Weight- Have your body mass index (BMI) calculated to screen for obesity.  BMI is measure of body fat based on height and weight.  You can also calculate your own BMI at https://www.west-esparza.com/www.nhlbisupport.com/bmi/. 3. Cholesterol- Have your cholesterol checked every 5 years starting at age 27 then yearly starting at age 27. 4. Chlamydia, HIV, and other sexually transmitted diseases- Get screened every year until age 27, then within three months of each new sexual provider. 5. Pap Smear- Every 1-3 years; discuss with your health care provider. 6. Mammogram- Every year starting at age 27  Take these medicines  Calcium with Vitamin D-Your body needs 1200 mg of Calcium each day and [phone] IU of Vitamin D daily.  Your body can only absorb 500 mg of Calcium at a time so Calcium must be taken in 2 or 3 divided doses throughout the day.  Multivitamin with folic acid- Once daily if it is possible for you to become pregnant.  Get these Immunizations  Gardasil-Series of three doses; prevents HPV related illness such as genital warts and cervical cancer.  Menactra-Single dose; prevents meningitis.  Tetanus shot- Every 10 years.  Flu shot-Every year.  Take these steps 1. Do not smoke-Your healthcare provider can help you quit.  For tips on how to quit go to www.smokefree.gov or call 1-800 QUITNOW. 2. Be physically active- Exercise 5 days a week for at least 30 minutes.  If you are not already physically active, start slow and gradually work  up to 30 minutes of moderate physical activity.  Examples of moderate activity include walking briskly, dancing, swimming, bicycling, etc. 3. Breast Cancer- A self breast exam every month is important for early detection of breast cancer.  For more information and instruction on self breast exams, ask your healthcare provider or [place].eswww.womenshealth.gov/faq/breast-self-exam.cfm. 4. Eat a healthy diet- Eat a variety of healthy foods such as fruits, vegetables, whole grains, low fat milk, low fat cheeses, yogurt, lean meats, poultry and fish, beans, nuts, tofu, etc.  For more information go to www. Thenutritionsource.org 5. Drink alcohol in moderation- Limit alcohol intake to one drink or less per day. Never drink and drive. 6. Depression- Your emotional health is as important as your physical health.  If you're feeling down or losing interest in things you normally enjoy please talk to your healthcare provider about being screened for depression. 7. Dental visit- Brush and floss your teeth twice daily; visit your dentist twice a year. 8. Eye doctor- Get an eye exam at least every 2 years. 9. Helmet use- Always wear a helmet when riding a bicycle, motorcycle, rollerblading or skateboarding. 10. Safe sex- If you may be exposed to sexually transmitted infections, use a condom. 11. Seat belts- Seat belts can save your live; always wear one. 12. Smoke/Carbon Monoxide detectors- These detectors need to be installed on the appropriate level of your home. Replace batteries at least once a year. 13. Skin cancer- When out in the sun please cover up and use sunscreen 15 SPF  or higher. 14. Violence- If anyone is threatening or hurting you, please tell your healthcare provider.

## 2014-05-31 NOTE — Progress Notes (Addendum)
Subjective:   This chart was scribed for Rachel Staggers, MD by Jarvis Morgan, Medical Scribe. This patient was seen in Room 3 and the patient's care was started at 10:10 AM.   Patient ID: Rachel [name], female    DOB: [birth year], 28 y.o.   MRN: [id number]  HPI HPI Comments: Rachel [name] is a 28 y.o. female who presents to the [facility] for her annual exam and completion of paperwork for work. She is fasting and would like to have blood work done. Pt has 3 children. She has a 42 year old and two twin girls that will be 27 years old in February. She denies any depression or anxiety symptoms.  Carpal tunnel syndrome bilaterally followed by Dr. Merlyn Lot. She had bilateral carpal tunnel release on August 27th. Pt states she is still having pain with that but is set to follow up with Dr. Merlyn Lot.  She is set to return to work next month  Immunizations: She has not gotten her annual influenza vaccine this year. She is unsure when she last got a t-dap. She believes it may have been in the past few years due to her pregnancy and delivery of her twins in 2014.   Exercise: She is no currently exercising due to her recent surgery for her carpal tunnel syndrome. She states before that she was working out about 3x per week.   Sexual Activity: Pt is currently sexually active. She has been with the same partner for the past 3 years. She has had STI screenings in the past through her OB/GYN due to her pregnancy.   Health Maintenance:  Her last pap smear was around 2 years ago. It was abnormal due to presence of abnormal cells. Her OB/GYN wanted her to have a colposcopy but she did not have it done due to her insurance at the time.  Pt wears glasses. Last opthalmology exam was about 2 years ago. She has not seen a dentist in the past year.   Tobacco/Alcohol Use: Pt has no history of alcohol abuse or drug use. She says she drinks socially on the weekends. She quit smoking around 5  years ago and has not had a cigarette since.   Patient Active Problem List   Diagnosis Date Noted  . Transient hypertension of pregnancy, with delivery 10/19/2012  . S/P cesarean section 10/06/2012  . 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 10/05/2012  . Multiple gestation with malpresentation of one fetus or more 10/05/2012   Past Medical History  Diagnosis Date  . Hypertension   . Pregnancy induced hypertension   . Shortness of breath     with bronchitis  . GERD (gastroesophageal reflux disease)     pregnancy related  . Anemia     1st pregnancy  . 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 10/05/2012  . Multiple gestation with malpresentation of one fetus or more 10/05/2012  . S/P cesarean section 10/06/2012  . Abnormal Pap smear   . Transient hypertension of pregnancy, with delivery 10/19/2012   Past Surgical History  Procedure Laterality Date  . Wisdom tooth extraction    . Cesarean section N/A 10/06/2012    Procedure: CESAREAN SECTION;  Surgeon: Sherron Monday, MD;  Location: [facility];  Service: Obstetrics;  Laterality: N/A;  PRIMARY C/S-TWINS  . Bilateral salpingectomy Bilateral 10/06/2012    Procedure: BILATERAL SALPINGECTOMY;  Surgeon: Sherron Monday, MD;  Location: [facility];  Service: Gynecology;  Laterality: Bilateral;  .  Tubal ligation    . Carpal tunnel release Bilateral 04/12/2014    Procedure: RIGHT CARPAL TUNNEL RELEASE, LEFT CARPAL TUNNEL RELEASE;  Surgeon: Cindee SaltGary Kuzma, MD;  Location: [facility];  Service: Orthopedics;  Laterality: Bilateral;   No Known Allergies Prior to Admission medications   Medication Sig Start Date End Date Taking? Authorizing Provider  traMADol (ULTRAM) 50 MG tablet Take by mouth every 6 (six) hours as needed.   Yes Historical Provider, MD  HYDROcodone-acetaminophen (NORCO) 10-325 MG per tablet Take 1 tablet by mouth every 6 (six) hours as needed. 04/12/14   Cindee SaltGary Kuzma, MD   History   Social History  .  Marital Status: Single    Spouse Name: N/A    Number of Children: N/A  . Years of Education: N/A   Occupational History  . Not on file.   Social History Main Topics  . Smoking status: Former Smoker    Types: Cigarettes    Quit date: 07/18/2009  . Smokeless tobacco: Not on file  . Alcohol Use: Yes     Comment: drinks on weekends  . Drug Use: No  . Sexual Activity: Yes    Birth Control/ Protection: Surgical   Other Topics Concern  . Not on file   Social History Narrative  . No narrative on file     Review of Systems  Musculoskeletal: Positive for arthralgias (at basline due to her carpal tunnel syndrome of right hand).  13 point ROS reviewed per pt health history. Negative other than above     Objective:   Physical Exam  Nursing note and vitals reviewed. Constitutional: She is oriented to person, place, and time. She appears well-developed and well-nourished. No distress.  HENT:  Head: Normocephalic and atraumatic.  Right Ear: External ear normal.  Left Ear: External ear normal.  Mouth/Throat: Oropharynx is clear and moist.  Eyes: Conjunctivae and EOM are normal. Pupils are equal, round, and reactive to light.  Neck: Normal range of motion. Neck supple. No tracheal deviation present. No thyromegaly present.  Cardiovascular: Normal rate, regular rhythm, normal heart sounds and intact distal pulses.   No murmur heard. Pulmonary/Chest: Effort normal and breath sounds normal. No respiratory distress. She has no wheezes.  Abdominal: Soft. Bowel sounds are normal. There is no tenderness.  Musculoskeletal: Normal range of motion. She exhibits no edema and no tenderness.  Lymphadenopathy:    She has no cervical adenopathy.  Neurological: She is alert and oriented to person, place, and time.  Skin: Skin is warm and dry. No rash noted.  Healing scars on bilateral volar wrists  Psychiatric: She has a normal mood and affect. Her behavior is normal. Thought content normal.      Filed Vitals:   05/31/14 0921  BP: 130/80  Pulse: 78  Temp: 98.2 F (36.8 C)  TempSrc: Oral  Resp: 16  Height: 5\' 7"  (1.702 m)  Weight: 168 lb (76.204 kg)  SpO2: 98%       Assessment & Plan:   Rachel HongCarteailya T Parslow is a 28 y.o. female Annual physical exam - Plan: POCT glucose (manual entry), POCT glycosylated hemoglobin (Hb A1C), Lipid panel  --anticipatory guidance as below in AVS, screening labs above. Health maintenance items as above in HPI discussed/recommended as applicable. Recommended optho and dentist eval.   Need for prophylactic vaccination with Influenza vaccine - Plan: Flu Vaccine QUAD 36+ mos IM given.   History of abnormal cervical Pap smear  -?ascus by her hx, and apparently had been  recommended to have colposcopy.  Follow up to have this rechecked with health dept, or here if needed.    Meds ordered this encounter  Medications  . traMADol (ULTRAM) 50 MG tablet    Sig: Take by mouth every 6 (six) hours as needed.   Patient Instructions  Follow up as discussed with previous provider for abnormal pap testing, as this needs to be repeated or other recommended evaluation.  Set up eye care provider and dentist appointments.  Exercise 150 minutes per week. You should receive a call or letter about your lab results within the next week to 10 days.   Keeping You Healthy  Get These Tests 1. Blood Pressure- Have your blood pressure checked once a year by your health care provider.  Normal blood pressure is 120/80. 2. Weight- Have your body mass index (BMI) calculated to screen for obesity.  BMI is measure of body fat based on height and weight.  You can also calculate your own BMI at https://www.west-esparza.com/. 3. Cholesterol- Have your cholesterol checked every 5 years starting at age 6 then yearly starting at age 4. 4. Chlamydia, HIV, and other sexually transmitted diseases- Get screened every year until age 85, then within three months of each new sexual  provider. 5. Pap Smear- Every 1-3 years; discuss with your health care provider. 6. Mammogram- Every year starting at age 30  Take these medicines  Calcium with Vitamin D-Your body needs 1200 mg of Calcium each day and [phone] IU of Vitamin D daily.  Your body can only absorb 500 mg of Calcium at a time so Calcium must be taken in 2 or 3 divided doses throughout the day.  Multivitamin with folic acid- Once daily if it is possible for you to become pregnant.  Get these Immunizations  Gardasil-Series of three doses; prevents HPV related illness such as genital warts and cervical cancer.  Menactra-Single dose; prevents meningitis.  Tetanus shot- Every 10 years.  Flu shot-Every year.  Take these steps 1. Do not smoke-Your healthcare provider can help you quit.  For tips on how to quit go to www.smokefree.gov or call 1-800 QUITNOW. 2. Be physically active- Exercise 5 days a week for at least 30 minutes.  If you are not already physically active, start slow and gradually work up to 30 minutes of moderate physical activity.  Examples of moderate activity include walking briskly, dancing, swimming, bicycling, etc. 3. Breast Cancer- A self breast exam every month is important for early detection of breast cancer.  For more information and instruction on self breast exams, ask your healthcare provider or [place].[employer]. 4. Eat a healthy diet- Eat a variety of healthy foods such as fruits, vegetables, whole grains, low fat milk, low fat cheeses, yogurt, lean meats, poultry and fish, beans, nuts, tofu, etc.  For more information go to www. Thenutritionsource.org 5. Drink alcohol in moderation- Limit alcohol intake to one drink or less per day. Never drink and drive. 6. Depression- Your emotional health is as important as your physical health.  If you're feeling down or losing interest in things you normally enjoy please talk to your healthcare provider about being  screened for depression. 7. Dental visit- Brush and floss your teeth twice daily; visit your dentist twice a year. 8. Eye doctor- Get an eye exam at least every 2 years. 9. Helmet use- Always wear a helmet when riding a bicycle, motorcycle, rollerblading or skateboarding. 10. Safe sex- If you may be exposed to sexually transmitted infections, use a  condom. 11. Seat belts- Seat belts can save your live; always wear one. 12. Smoke/Carbon Monoxide detectors- These detectors need to be installed on the appropriate level of your home. Replace batteries at least once a year. 13. Skin cancer- When out in the sun please cover up and use sunscreen 15 SPF or higher. 14. Violence- If anyone is threatening or hurting you, please tell your healthcare provider.             I personally performed the services described in this documentation, which was scribed in my presence. The recorded information has been reviewed and considered, and addended by me as needed.

## 2014-06-08 ENCOUNTER — Encounter: Payer: Self-pay | Admitting: Radiology

## 2014-08-24 ENCOUNTER — Encounter (HOSPITAL_COMMUNITY): Payer: Self-pay | Admitting: Emergency Medicine

## 2014-08-24 ENCOUNTER — Emergency Department (HOSPITAL_COMMUNITY)
Admission: EM | Admit: 2014-08-24 | Discharge: 2014-08-25 | Disposition: A | Discharge status: Home / Self Care | Payer: BLUE CROSS/BLUE SHIELD | Attending: Emergency Medicine | Admitting: Emergency Medicine

## 2014-08-24 DIAGNOSIS — I10 Essential (primary) hypertension: Secondary | ICD-10-CM | POA: Insufficient documentation

## 2014-08-24 DIAGNOSIS — Z87891 Personal history of nicotine dependence: Secondary | ICD-10-CM | POA: Insufficient documentation

## 2014-08-24 DIAGNOSIS — Z862 Personal history of diseases of the blood and blood-forming organs and certain disorders involving the immune mechanism: Secondary | ICD-10-CM | POA: Insufficient documentation

## 2014-08-24 DIAGNOSIS — Z8719 Personal history of other diseases of the digestive system: Secondary | ICD-10-CM | POA: Insufficient documentation

## 2014-08-24 DIAGNOSIS — H53149 Visual discomfort, unspecified: Secondary | ICD-10-CM | POA: Diagnosis present

## 2014-08-24 DIAGNOSIS — R519 Headache, unspecified: Secondary | ICD-10-CM

## 2014-08-24 DIAGNOSIS — R51 Headache: Secondary | ICD-10-CM | POA: Diagnosis not present

## 2014-08-24 MED ORDER — DEXAMETHASONE SODIUM PHOSPHATE 10 MG/ML IJ SOLN
10.0000 mg | Freq: Once | INTRAMUSCULAR | Status: AC
  Administered 2014-08-24: 10 mg via INTRAVENOUS
  Filled 2014-08-24: qty 1

## 2014-08-24 MED ORDER — SODIUM CHLORIDE 0.9 % IV BOLUS (SEPSIS)
1000.0000 mL | Freq: Once | INTRAVENOUS | Status: AC
  Administered 2014-08-24: 1000 mL via INTRAVENOUS

## 2014-08-24 MED ORDER — DIPHENHYDRAMINE HCL 50 MG/ML IJ SOLN
25.0000 mg | Freq: Once | INTRAMUSCULAR | Status: AC
  Administered 2014-08-24: 25 mg via INTRAVENOUS
  Filled 2014-08-24: qty 1

## 2014-08-24 MED ORDER — METOCLOPRAMIDE HCL 5 MG/ML IJ SOLN
10.0000 mg | Freq: Once | INTRAMUSCULAR | Status: AC
  Administered 2014-08-24: 10 mg via INTRAMUSCULAR
  Filled 2014-08-24: qty 2

## 2014-08-24 MED ORDER — KETOROLAC TROMETHAMINE 30 MG/ML IJ SOLN
30.0000 mg | Freq: Once | INTRAMUSCULAR | Status: AC
  Administered 2014-08-24: 30 mg via INTRAVENOUS
  Filled 2014-08-24: qty 1

## 2014-08-24 NOTE — ED Notes (Signed)
Pt c/o headache x3 days with blurry vision, "black spots", and dizziness. Denies chest pain, SOB, nausea, vomiting, diarrhea. Was on blood pressure medicine around pregnancy 2 years ago. Has been checking blood pressure intermittently and says it is around 130-150 systolic. HTN runs in family-mother and brother (on dialysis). 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 Ambulatory with steady gait. RR even/unlabored. Speaking full/clear sentences.

## 2014-08-24 NOTE — ED Provider Notes (Signed)
CSN: [id number]     Arrival date & time 08/24/14  1925 History   First MD Initiated Contact with Patient 08/24/14 2242     No chief complaint on file.    (Consider location/radiation/quality/duration/timing/severity/associated sxs/prior Treatment) HPI  Pt is a 29yo female with hx of HTN and anemia, presenting to ED with c/o gradually worsening headache that started 3 days ago, associated with photophobia and blurred vision. Pt states headache is diffuse, aching and throbbing, 8/10, not relieved by 1 dose of Aleve yesterday.  Reports having similar headaches off and on for several years but states she came in tonight as headaches have become more frequent. Pt states she has been under stress due to working 3rd shift as well as having kids at home to take care of. States she does not get much sleep. Mother advised pt come in to be evaluated. Denies fever, chills, n/v/d. No known hx of migraines. Has never seen neurology. Not currently taking BP medication as she does not have a PCP.    Past Medical History  Diagnosis Date  . Hypertension   . Pregnancy induced hypertension   . Shortness of breath     with bronchitis  . GERD (gastroesophageal reflux disease)     pregnancy related  . Anemia     1st pregnancy  . 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 10/05/2012  . Multiple gestation with malpresentation of one fetus or more 10/05/2012  . S/P cesarean section 10/06/2012  . Abnormal Pap smear   . Transient hypertension of pregnancy, with delivery 10/19/2012   Past Surgical History  Procedure Laterality Date  . Wisdom tooth extraction    . Cesarean section N/A 10/06/2012    Procedure: CESAREAN SECTION;  Surgeon: Sherron MondayJody Bovard, MD;  Location: [facility];  Service: Obstetrics;  Laterality: N/A;  PRIMARY C/S-TWINS  . Bilateral salpingectomy Bilateral 10/06/2012    Procedure: BILATERAL SALPINGECTOMY;  Surgeon: Sherron MondayJody Bovard, MD;  Location: [facility];  Service: Gynecology;  Laterality: Bilateral;   . Tubal ligation    . Carpal tunnel release Bilateral 04/12/2014    Procedure: RIGHT CARPAL TUNNEL RELEASE, LEFT CARPAL TUNNEL RELEASE;  Surgeon: Cindee SaltGary Kuzma, MD;  Location: [facility];  Service: Orthopedics;  Laterality: Bilateral;   Family History  Problem Relation Age of Onset  . Hypertension Mother   . Hypertension Father   . Cancer Father    History  Substance Use Topics  . Smoking status: Former Smoker    Types: Cigarettes    Quit date: 07/18/2009  . Smokeless tobacco: Not on file  . Alcohol Use: Yes     Comment: drinks on weekends   OB History    Gravida Para Term Preterm AB TAB SAB Ectopic Multiple Living   2 2 2      1 3      Review of Systems  Eyes: Positive for photophobia and visual disturbance.  Gastrointestinal: Negative for nausea and vomiting.  Neurological: Positive for dizziness and headaches. Negative for seizures, syncope and numbness.  All other systems reviewed and are negative.     Allergies  Review of patient's allergies indicates no known allergies.  Home Medications   Prior to Admission medications   Medication Sig Start Date End Date Taking? Authorizing Provider  aspirin-acetaminophen-caffeine (EXCEDRIN MIGRAINE) [id number] MG per tablet Take 1-2 tablets by mouth every 6 to 8 hours as needed for headache 08/25/14   Junius FinnerErin O'Malley, PA-C  HYDROcodone-acetaminophen [facility](NORCO) 10-325 MG per tablet Take 1 tablet  by mouth every 6 (six) hours as needed. Patient not taking: Reported on 08/24/2014 04/12/14   Cindee Salt, MD  traMADol (ULTRAM) 50 MG tablet Take by mouth every 6 (six) hours as needed.    Historical Provider, MD   BP 150/78 mmHg  Pulse 81  Temp(Src) 98.6 F (37 C) (Oral)  Resp 18  Wt 172 lb (78.019 kg)  SpO2 100%  LMP 08/07/2014 (Approximate) Physical Exam  Constitutional: She is oriented to person, place, and time. She appears well-developed and well-nourished. No distress.  HENT:  Head: Normocephalic and atraumatic.  Eyes:  Conjunctivae are normal. No scleral icterus.  Neck: Normal range of motion.  No nuchal rigidity or meningeal signs.  Cardiovascular: Normal rate, regular rhythm and normal heart sounds.   Pulmonary/Chest: Effort normal and breath sounds normal. No respiratory distress. She has no wheezes. She has no rales. She exhibits no tenderness.  Abdominal: Soft. Bowel sounds are normal. She exhibits no distension and no mass. There is no tenderness. There is no rebound and no guarding.  Musculoskeletal: Normal range of motion.  Neurological: She is alert and oriented to person, place, and time. She has normal strength. No cranial nerve deficit or sensory deficit. She displays a negative Romberg sign. Coordination and gait normal. GCS eye subscore is 4. GCS verbal subscore is 5. GCS motor subscore is 6.  Alert and oriented to person, place and time. Speech is fluent. No ataxia. Normal coordination. 5/5 strength in upper and lower extremities bilaterally.   Skin: Skin is warm and dry. She is not diaphoretic.  Nursing note and vitals reviewed.   ED Course  Procedures (including critical care time) Labs Review Labs Reviewed - No data to display  Imaging Review No results found.   EKG Interpretation None      MDM   Final diagnoses:  Recurrent headache    Pt is a 29yo female c/o headache that was gradual in onset, similar to previous. Normal neuro exam.  Doubt SAH, CVA, temporal arteritis or other intracranial bleed or emergent process taking place at this time. No meningeal signs.  Will tx with migraine cocktail and reevaluate.    Pt states her headache did improve after treatment. States she feels comfortable being discharged home.  Advised pt to f/u with [facility]. Recommended pt keep 'headache log' to bring to f/u with PCP and neurology. Home care instructions provided. Rx: excedrin. Return precautions provided. Pt verbalized understanding and agreement with tx plan.        Junius Finner, PA-C 08/25/14 0022  Lyanne Co, MD 08/25/14 [phone]

## 2014-08-25 MED ORDER — ASPIRIN-ACETAMINOPHEN-CAFFEINE 250-250-65 MG PO TABS: ORAL_TABLET | ORAL | Status: DC

## 2014-09-14 ENCOUNTER — Encounter: Payer: Self-pay | Admitting: Family

## 2014-09-14 ENCOUNTER — Ambulatory Visit (INDEPENDENT_AMBULATORY_CARE_PROVIDER_SITE_OTHER): Payer: BLUE CROSS/BLUE SHIELD | Admitting: Family

## 2014-09-14 VITALS — BP 142/92 | HR 55 | Temp 98.4°F | Resp 18 | Ht 67.0 in | Wt 176.2 lb

## 2014-09-14 DIAGNOSIS — G43809 Other migraine, not intractable, without status migrainosus: Secondary | ICD-10-CM

## 2014-09-14 DIAGNOSIS — Z7189 Other specified counseling: Secondary | ICD-10-CM

## 2014-09-14 DIAGNOSIS — I10 Essential (primary) hypertension: Secondary | ICD-10-CM

## 2014-09-14 DIAGNOSIS — Z7689 Persons encountering health services in other specified circumstances: Secondary | ICD-10-CM

## 2014-09-14 DIAGNOSIS — G43909 Migraine, unspecified, not intractable, without status migrainosus: Secondary | ICD-10-CM | POA: Insufficient documentation

## 2014-09-14 MED ORDER — SUMATRIPTAN SUCCINATE 50 MG PO TABS: ORAL_TABLET | ORAL | Status: DC

## 2014-09-14 MED ORDER — PROMETHAZINE HCL 12.5 MG PO TABS: 12.5000 mg | ORAL_TABLET | Freq: Three times a day (TID) | ORAL | Status: DC | PRN

## 2014-09-14 NOTE — Progress Notes (Signed)
Pre visit review using our clinic review tool, if applicable. No additional management support is needed unless otherwise documented below in the visit note. 

## 2014-09-14 NOTE — Assessment & Plan Note (Signed)
Hypertension appears stable with no medication. Continue to monitor at this time.

## 2014-09-14 NOTE — Progress Notes (Signed)
Subjective:    Patient ID: Rachel [name], female    DOB: 03/09/1986, 29 y.o.   MRN: [id number]  Chief Complaint  Patient presents with  . Establish Care    hyertension and headaches has had bp issues for years     HPI:  Rachel [name] is a 29 y.o. female who presents today   1) Headaches - Previous history of migraine headaches and was given Excedrin which does not help a lot. Headaches are described as throbbing generalized headache, sensitivity to light and sound, no nausea or vomiting. Was taking Aleve which did not help at all. Headache intensity 8/10 at worst. For the most part has an underlying headache. Unaware of any potential triggers.   2) Hypertension - History of hypertension. Not currently treated with any medication. Had pregnancy induced hypertension. Indicates she sees black spots on occasion. Recently had eye exam which was normal.   BP Readings from Last 3 Encounters:  09/14/14 142/92  08/25/14 116/52  05/31/14 130/80    No Known Allergies   Current Outpatient Prescriptions on File Prior to Visit  Medication Sig Dispense Refill  . aspirin-acetaminophen-caffeine (EXCEDRIN MIGRAINE) 250-250-65 MG per tablet Take 1-2 tablets by mouth every 6 to 8 hours as needed for headache 30 tablet 0   No current facility-administered medications on file prior to visit.    Past Medical History  Diagnosis Date  . Hypertension   . Pregnancy induced hypertension   . Shortness of breath     with bronchitis  . GERD (gastroesophageal reflux disease)     pregnancy related  . Anemia     1st pregnancy  . 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 10/05/2012  . Multiple gestation with malpresentation of one fetus or more 10/05/2012  . S/P cesarean section 10/06/2012  . Abnormal Pap smear   . Transient hypertension of pregnancy, with delivery 10/19/2012    Past Surgical History  Procedure Laterality Date  . Wisdom tooth extraction    . Cesarean section N/A  10/06/2012    Procedure: CESAREAN SECTION;  Surgeon: Sherron Monday, MD;  Location: [facility];  Service: Obstetrics;  Laterality: N/A;  PRIMARY C/S-TWINS  . Bilateral salpingectomy Bilateral 10/06/2012    Procedure: BILATERAL SALPINGECTOMY;  Surgeon: Sherron Monday, MD;  Location: [facility];  Service: Gynecology;  Laterality: Bilateral;  . Tubal ligation    . Carpal tunnel release Bilateral 04/12/2014    Procedure: RIGHT CARPAL TUNNEL RELEASE, LEFT CARPAL TUNNEL RELEASE;  Surgeon: Cindee Salt, MD;  Location: [facility];  Service: Orthopedics;  Laterality: Bilateral;    Family History  Problem Relation Age of Onset  . Hypertension Mother   . Asthma Mother   . Hypertension Father   . Cancer Father   . Hypertension Maternal Grandmother   . Diabetes Maternal Grandmother   . Cancer Maternal Grandfather   . Cancer Paternal Grandmother   . Hypertension Paternal Grandmother     History   Social History  . Marital Status: Single    Spouse Name: N/A    Number of Children: 3  . Years of Education: 14   Occupational History  . Production operator    Social History Main Topics  . Smoking status: Former Smoker -- 0.15 packs/day for 3 years    Types: Cigarettes    Quit date: 07/18/2009  . Smokeless tobacco: Never Used  . Alcohol Use: 2.4 oz/week    2 Cans of beer, 2 Shots of liquor per  week     Comment: drinks on weekends  . Drug Use: No  . Sexual Activity: Yes    Birth Control/ Protection: Surgical   Other Topics Concern  . Not on file   Social History Narrative   Born in [place] and raised in [place]. Currently resides in a townhouse 3 children and boyfriend. No pets. Fun: ride a motorcycle.   Denies religious beliefs to effect health care.     Review of Systems  Respiratory: Negative for chest tightness and shortness of breath.   Cardiovascular: Negative for chest pain, palpitations and leg swelling.  Neurological: Positive for headaches.      Objective:    BP  142/92 mmHg  Pulse 55  Temp(Src) 98.4 F (36.9 C) (Oral)  Resp 18  Ht 5\' 7"  (1.702 m)  Wt 176 lb 3.2 oz (79.924 kg)  BMI 27.59 kg/m2  SpO2 98%  LMP 08/07/2014 (Approximate) Nursing note and vital signs reviewed.  Physical Exam  Constitutional: She is oriented to person, place, and time. She appears well-developed and well-nourished. No distress.  Cardiovascular: Normal rate, regular rhythm, normal heart sounds and intact distal pulses.   Pulmonary/Chest: Effort normal and breath sounds normal.  Neurological: She is alert and oriented to person, place, and time. She has normal reflexes. No cranial nerve deficit. She exhibits normal muscle tone. Coordination normal.  Skin: Skin is warm and dry.  Psychiatric: She has a normal mood and affect. Her behavior is normal. Judgment and thought content normal.       Assessment & Plan:

## 2014-09-14 NOTE — Assessment & Plan Note (Signed)
Symptoms described consistent with migraine headaches. Discussed abortive and preventive therapy with patient. Start Imitrex and Phenergan for headaches. Patient will track her headache frequency and potential triggers. Follow up after trials of medication.

## 2014-09-14 NOTE — Patient Instructions (Addendum)
Thank you for choosing [facility].  Summary/Instructions:  Your prescription(s) have been submitted to your pharmacy or been printed and provided for you. Please take as directed and contact our office if you believe you are having problem(s) with the medication(s) or have any questions.  If your symptoms worsen or fail to improve, please contact our office for further instruction, or in case of emergency go directly to the emergency room at the closest medical facility.   Migraine Headache A migraine headache is an intense, throbbing pain on one or both sides of your head. A migraine can last for 30 minutes to several hours. CAUSES  The exact cause of a migraine headache is not always known. However, a migraine may be caused when nerves in the brain become irritated and release chemicals that cause inflammation. This causes pain. Certain things may also trigger migraines, such as:  Alcohol.  Smoking.  Stress.  Menstruation.  Aged cheeses.  Foods or drinks that contain nitrates, glutamate, aspartame, or tyramine.  Lack of sleep.  Chocolate.  Caffeine.  Hunger.  Physical exertion.  Fatigue.  Medicines used to treat chest pain (nitroglycerine), birth control pills, estrogen, and some blood pressure medicines. SIGNS AND SYMPTOMS  Pain on one or both sides of your head.  Pulsating or throbbing pain.  Severe pain that prevents daily activities.  Pain that is aggravated by any physical activity.  Nausea, vomiting, or both.  Dizziness.  Pain with exposure to bright lights, loud noises, or activity.  General sensitivity to bright lights, loud noises, or smells. Before you get a migraine, you may get warning signs that a migraine is coming (aura). An aura may include:  Seeing flashing lights.  Seeing bright spots, halos, or zigzag lines.  Having tunnel vision or blurred vision.  Having feelings of numbness or tingling.  Having trouble  talking.  Having muscle weakness. DIAGNOSIS  A migraine headache is often diagnosed based on:  Symptoms.  Physical exam.  A CT scan or MRI of your head. These imaging tests cannot diagnose migraines, but they can help rule out other causes of headaches. TREATMENT Medicines may be given for pain and nausea. Medicines can also be given to help prevent recurrent migraines.  HOME CARE INSTRUCTIONS  Only take over-the-counter or prescription medicines for pain or discomfort as directed by your health care provider. The use of long-term narcotics is not recommended.  Lie down in a dark, quiet room when you have a migraine.  Keep a journal to find out what may trigger your migraine headaches. For example, write down:  What you eat and drink.  How much sleep you get.  Any change to your diet or medicines.  Limit alcohol consumption.  Quit smoking if you smoke.  Get 7-9 hours of sleep, or as recommended by your health care provider.  Limit stress.  Keep lights dim if bright lights bother you and make your migraines worse. SEEK IMMEDIATE MEDICAL CARE IF:   Your migraine becomes severe.  You have a fever.  You have a stiff neck.  You have vision loss.  You have muscular weakness or loss of muscle control.  You start losing your balance or have trouble walking.  You feel faint or pass out.  You have severe symptoms that are different from your first symptoms. MAKE SURE YOU:   Understand these instructions.  Will watch your condition.  Will get help right away if you are not doing well or get worse. Document Released: 08/03/2005 Document   Revised: 12/18/2013 Document Reviewed: 04/10/2013 Exit[employer] Patient Information 2015 [employer]. This information is not intended to replace advice given to you by your health care provider. Make sure you discuss any questions you have with your health care provider.  

## 2014-09-18 ENCOUNTER — Telehealth: Payer: Self-pay | Admitting: Family

## 2014-09-18 NOTE — Telephone Encounter (Signed)
emmi emailed °

## 2014-10-05 ENCOUNTER — Encounter: Payer: Self-pay | Admitting: Family Medicine

## 2014-10-15 ENCOUNTER — Ambulatory Visit: Payer: BLUE CROSS/BLUE SHIELD | Admitting: Family

## 2014-10-16 ENCOUNTER — Encounter: Payer: Self-pay | Admitting: Family

## 2014-10-16 ENCOUNTER — Ambulatory Visit (INDEPENDENT_AMBULATORY_CARE_PROVIDER_SITE_OTHER): Payer: BLUE CROSS/BLUE SHIELD | Admitting: Family

## 2014-10-16 VITALS — BP 122/84 | HR 66 | Temp 98.4°F | Resp 18 | Ht 67.0 in | Wt 175.4 lb

## 2014-10-16 DIAGNOSIS — G43809 Other migraine, not intractable, without status migrainosus: Secondary | ICD-10-CM

## 2014-10-16 MED ORDER — BUTALBITAL-APAP-CAFFEINE 50-325-40 MG PO TABS: 1.0000 | ORAL_TABLET | ORAL | Status: DC | PRN

## 2014-10-16 NOTE — Assessment & Plan Note (Signed)
Previously prescribed Imitrex, which patient indicates was not overly effective. Discontinue Imitrex. Start Fioricet as needed for headaches. Discussed risks and benefits of medication and patient wishes to proceed. Follow-up after trial of Fioricet.

## 2014-10-16 NOTE — Progress Notes (Signed)
Pre visit review using our clinic review tool, if applicable. No additional management support is needed unless otherwise documented below in the visit note. 

## 2014-10-16 NOTE — Patient Instructions (Addendum)
Thank you for choosing [facility].  Summary/Instructions:  Your prescription(s) have been submitted to your pharmacy or been printed and provided for you. Please take as directed and contact our office if you believe you are having problem(s) with the medication(s) or have any questions.  Please stop by the lab on the basement level of the building for your blood If your symptoms worsen or fail to improve, please contact our office for further instruction, or in case of emergency go directly to the emergency room at the closest medical facility.

## 2014-10-16 NOTE — Progress Notes (Signed)
   Subjective:    Patient ID: Irean Hongarteailya T Lemelin, female    DOB: 09-08-1985, 29 y.o.   MRN: [id number]  Chief Complaint  Patient presents with  . Follow-up    says shes here following up on headaches, was prescribed imitrex but says it really didn't help much    HPI:  Holley T Bahri is a 29 y.o. female who presents today to follow up on headaches.  She was previously started on imitrex for her headaches and indicates that it does not help very much. Indicates that she continues to experience the associated symptoms of headaches described as throbbing and sensitivity to light and sound. Currently not experiencing a headache.   No Known Allergies   Current Outpatient Prescriptions on File Prior to Visit  Medication Sig Dispense Refill  . aspirin-acetaminophen-caffeine (EXCEDRIN MIGRAINE) 250-250-65 MG per tablet Take 1-2 tablets by mouth every 6 to 8 hours as needed for headache 30 tablet 0  . promethazine (PHENERGAN) 12.5 MG tablet Take 1 tablet (12.5 mg total) by mouth every 8 (eight) hours as needed for nausea or vomiting. 20 tablet 0  . SUMAtriptan (IMITREX) 50 MG tablet Take 1 tablet at the onset of a headache. Take an additional 1 tablet 2 hours later if headache persists. 10 tablet 0   No current facility-administered medications on file prior to visit.    Review of Systems  Gastrointestinal: Negative for nausea and vomiting.  Neurological: Positive for headaches.      Objective:    BP 122/84 mmHg  Pulse 66  Temp(Src) 98.4 F (36.9 C) (Oral)  Resp 18  Ht 5\' 7"  (1.702 m)  Wt 175 lb 6.4 oz (79.561 kg)  BMI 27.47 kg/m2  SpO2 98% Nursing note and vital signs reviewed.  Physical Exam  Constitutional: She is oriented to person, place, and time. She appears well-developed and well-nourished. No distress.  Cardiovascular: Normal rate, regular rhythm, normal heart sounds and intact distal pulses.   Pulmonary/Chest: Effort normal and breath sounds normal.  Neurological:  She is alert and oriented to person, place, and time. She has normal reflexes. No cranial nerve deficit. Coordination normal.  Skin: Skin is warm and dry.  Psychiatric: She has a normal mood and affect. Her behavior is normal. Judgment and thought content normal.       Assessment & Plan:

## 2014-10-23 ENCOUNTER — Telehealth: Payer: Self-pay | Admitting: Family

## 2014-10-23 ENCOUNTER — Other Ambulatory Visit: Payer: Self-pay | Admitting: Family

## 2014-10-23 DIAGNOSIS — G43809 Other migraine, not intractable, without status migrainosus: Secondary | ICD-10-CM

## 2014-10-23 NOTE — Telephone Encounter (Signed)
Spoke with patient regarding potential use of FMLA because of increased frequency of headaches. She will investigate and drop off paperwork if appropriate. Will place referral to neurology for assistance with management of headaches.

## 2014-10-23 NOTE — Telephone Encounter (Signed)
Pt request to speak to Tammy SoursGreg about her headache and the medication that he gave her for it. Please call pt

## 2014-11-06 ENCOUNTER — Ambulatory Visit (INDEPENDENT_AMBULATORY_CARE_PROVIDER_SITE_OTHER): Payer: BLUE CROSS/BLUE SHIELD | Admitting: Physician Assistant

## 2014-11-06 VITALS — BP 110/66 | HR 66 | Temp 98.4°F | Resp 20 | Ht 67.0 in | Wt 179.0 lb

## 2014-11-06 DIAGNOSIS — M545 Low back pain, unspecified: Secondary | ICD-10-CM

## 2014-11-06 MED ORDER — MELOXICAM 15 MG PO TABS: 15.0000 mg | ORAL_TABLET | Freq: Every day | ORAL | Status: DC

## 2014-11-06 MED ORDER — TRAMADOL HCL 50 MG PO TABS: 50.0000 mg | ORAL_TABLET | Freq: Three times a day (TID) | ORAL | Status: DC | PRN

## 2014-11-06 MED ORDER — CYCLOBENZAPRINE HCL 10 MG PO TABS: 10.0000 mg | ORAL_TABLET | Freq: Three times a day (TID) | ORAL | Status: DC | PRN

## 2014-11-06 NOTE — Progress Notes (Signed)
Subjective:    Patient ID: Rachel [name], female    DOB: Dec 21, 1985, 29 y.o.   MRN: [id number]  HPI Patient presents for lower back pain that was first felt when she arrived to work last night. Pain is worse with bending over and when going from sitting to standing position. Is 8-9/10 sharp pain that radiates to right left and right side of back. Denies loss of sensation/ROM/fxn, weakness, or numbness/tingling of extremities. No urinary sx. Denies trauma or falls. 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 Has never happened before.    Review of Systems  Constitutional: Negative.   Gastrointestinal: Negative for nausea, vomiting, abdominal pain, diarrhea and constipation.  Genitourinary: Negative for dysuria, frequency, flank pain, vaginal discharge, difficulty urinating and pelvic pain.  Musculoskeletal: Positive for back pain. Negative for joint swelling, arthralgias, gait problem, neck pain and neck stiffness.  Neurological: Negative for dizziness and headaches.       Objective:   Physical Exam  Constitutional: She is oriented to person, place, and time. She appears well-developed and well-nourished. No distress.  Blood pressure 110/66, pulse 66, temperature 98.4 F (36.9 C), temperature source Oral, resp. rate 20, height 5\' 7"  (1.702 m), weight 179 lb (81.194 kg), last menstrual period 11/02/2014, SpO2 100 %.  HENT:  Head: Normocephalic and atraumatic.  Right Ear: External ear normal.  Left Ear: External ear normal.  Mouth/Throat: Oropharynx is clear and moist.  Eyes: Conjunctivae and EOM are normal. Pupils are equal, round, and reactive to light. Right eye exhibits no discharge. Left eye exhibits no discharge. No scleral icterus.  Neck: Normal range of motion. Neck supple.  Cardiovascular: Normal rate, regular rhythm and normal heart sounds.  Exam reveals no gallop and no friction rub.   No murmur heard. Pulmonary/Chest:  Effort normal and breath sounds normal. No respiratory distress. She has no wheezes. She has no rales.  Abdominal: Soft. Bowel sounds are normal. She exhibits no distension. There is no tenderness. There is no rebound, no guarding and no CVA tenderness. No hernia.  Musculoskeletal: Normal range of motion. She exhibits tenderness. She exhibits no edema.       Cervical back: Normal.       Thoracic back: Normal.       Lumbar back: She exhibits tenderness and pain. She exhibits normal range of motion, no swelling, no edema, no deformity, no laceration and no spasm.  Lymphadenopathy:    She has no cervical adenopathy.  Neurological: She is alert and oriented to person, place, and time. She has normal strength and normal reflexes. She displays no atrophy. No cranial nerve deficit or sensory deficit. She exhibits normal muscle tone. Coordination normal.  Skin: Skin is warm and dry. No rash noted. She is not diaphoretic. No erythema. No pallor.      Assessment & Plan:  1. Bilateral low back pain without sciatica Should alternate ice and heating pad 15-20 min 3-4x daily. No imaging at this time. Will reconsider if no improvement with conservative tx in 2 weeks. Back rest advised. Works night shift so written to return to work 11/07/14. - cyclobenzaprine (FLEXERIL) 10 MG tablet; Take 1 tablet (10 mg total) by mouth 3 (three) times daily as needed for muscle spasms.  Dispense: 30 tablet; Refill: 0 - meloxicam (MOBIC) 15 MG tablet; Take 1 tablet (15 mg total) by mouth daily.  Dispense: 30 tablet; Refill: 1 - traMADol (ULTRAM) 50 MG tablet; Take 1 tablet (  50 mg total) by mouth every 8 (eight) hours as needed.  Dispense: 30 tablet; Refill: 0   Delrose Rohwer PA-C  [facility] 11/06/2014 5:39 PM

## 2014-11-06 NOTE — Patient Instructions (Signed)
Back Pain, Adult Low back pain is very common. About 1 in 5 people have back pain.The cause of low back pain is rarely dangerous. The pain often gets better over time.About half of people with a sudden onset of back pain feel better in just 2 weeks. About 8 in 10 people feel better by 6 weeks.  CAUSES Some common causes of back pain include:  Strain of the muscles or ligaments supporting the spine.  Wear and tear (degeneration) of the spinal discs.  Arthritis.  Direct injury to the back. DIAGNOSIS Most of the time, the direct cause of low back pain is not known.However, back pain can be treated effectively even when the exact cause of the pain is unknown.Answering your caregiver's questions about your overall health and symptoms is one of the most accurate ways to make sure the cause of your pain is not dangerous. If your caregiver needs more information, he or she may order lab work or imaging tests (X-rays or MRIs).However, even if imaging tests show changes in your back, this usually does not require surgery. HOME CARE INSTRUCTIONS For many people, back pain returns.Since low back pain is rarely dangerous, it is often a condition that people can learn to manageon their own.   Remain active. It is stressful on the back to sit or stand in one place. Do not sit, drive, or stand in one place for more than 30 minutes at a time. Take short walks on level surfaces as soon as pain allows.Try to increase the length of time you walk each day.  Do not stay in bed.Resting more than 1 or 2 days can delay your recovery.  Do not avoid exercise or work.Your body is made to move.It is not dangerous to be active, even though your back may hurt.Your back will likely heal faster if you return to being active before your pain is gone.  Pay attention to your body when you bend and lift. Many people have less discomfortwhen lifting if they bend their knees, keep the load close to their bodies,and  avoid twisting. Often, the most comfortable positions are those that put less stress on your recovering back.  Find a comfortable position to sleep. Use a firm mattress and lie on your side with your knees slightly bent. If you lie on your back, put a pillow under your knees.  Only take over-the-counter or prescription medicines as directed by your caregiver. Over-the-counter medicines to reduce pain and inflammation are often the most helpful.Your caregiver may prescribe muscle relaxant drugs.These medicines help dull your pain so you can more quickly return to your normal activities and healthy exercise.  Put ice on the injured area.  Put ice in a plastic bag.  Place a towel between your skin and the bag.  Leave the ice on for 15-20 minutes, 03-04 times a day for the first 2 to 3 days. After that, ice and heat may be alternated to reduce pain and spasms.  Ask your caregiver about trying back exercises and gentle massage. This may be of some benefit.  Avoid feeling anxious or stressed.Stress increases muscle tension and can worsen back pain.It is important to recognize when you are anxious or stressed and learn ways to manage it.Exercise is a great option. SEEK MEDICAL CARE IF:  You have pain that is not relieved with rest or medicine.  You have pain that does not improve in 1 week.  You have new symptoms.  You are generally not feeling well. SEEK   IMMEDIATE MEDICAL CARE IF:   You have pain that radiates from your back into your legs.  You develop new bowel or bladder control problems.  You have unusual weakness or numbness in your arms or legs.  You develop nausea or vomiting.  You develop abdominal pain.  You feel faint. Document Released: 08/03/2005 Document Revised: 02/02/2012 Document Reviewed: 12/05/2013 Exit[employer] Patient Information 2015 [employer]. This information is not intended to replace advice given to you by your health care provider. Make sure you  discuss any questions you have with your health care provider.  

## 2014-11-08 ENCOUNTER — Emergency Department (HOSPITAL_COMMUNITY): Payer: BLUE CROSS/BLUE SHIELD

## 2014-11-08 ENCOUNTER — Encounter (HOSPITAL_COMMUNITY): Payer: Self-pay

## 2014-11-08 ENCOUNTER — Emergency Department (HOSPITAL_COMMUNITY)
Admission: EM | Admit: 2014-11-08 | Discharge: 2014-11-08 | Disposition: A | Discharge status: Home / Self Care | Payer: BLUE CROSS/BLUE SHIELD | Attending: Emergency Medicine | Admitting: Emergency Medicine

## 2014-11-08 DIAGNOSIS — M5440 Lumbago with sciatica, unspecified side: Secondary | ICD-10-CM | POA: Diagnosis not present

## 2014-11-08 DIAGNOSIS — M545 Low back pain: Secondary | ICD-10-CM | POA: Diagnosis present

## 2014-11-08 DIAGNOSIS — Z862 Personal history of diseases of the blood and blood-forming organs and certain disorders involving the immune mechanism: Secondary | ICD-10-CM | POA: Insufficient documentation

## 2014-11-08 DIAGNOSIS — Z87891 Personal history of nicotine dependence: Secondary | ICD-10-CM | POA: Diagnosis not present

## 2014-11-08 DIAGNOSIS — Z791 Long term (current) use of non-steroidal anti-inflammatories (NSAID): Secondary | ICD-10-CM | POA: Diagnosis not present

## 2014-11-08 DIAGNOSIS — Z8719 Personal history of other diseases of the digestive system: Secondary | ICD-10-CM | POA: Diagnosis not present

## 2014-11-08 DIAGNOSIS — I10 Essential (primary) hypertension: Secondary | ICD-10-CM | POA: Diagnosis not present

## 2014-11-08 DIAGNOSIS — Z8709 Personal history of other diseases of the respiratory system: Secondary | ICD-10-CM | POA: Insufficient documentation

## 2014-11-08 DIAGNOSIS — Z3202 Encounter for pregnancy test, result negative: Secondary | ICD-10-CM | POA: Diagnosis not present

## 2014-11-08 LAB — URINALYSIS, ROUTINE W REFLEX MICROSCOPIC
BILIRUBIN URINE: NEGATIVE
Glucose, UA: NEGATIVE mg/dL
Hgb urine dipstick: NEGATIVE
Ketones, ur: NEGATIVE mg/dL
NITRITE: NEGATIVE
PROTEIN: NEGATIVE mg/dL
Specific Gravity, Urine: 1.027 (ref 1.005–1.030)
UROBILINOGEN UA: 0.2 mg/dL (ref 0.0–1.0)
pH: 7 (ref 5.0–8.0)

## 2014-11-08 LAB — URINE MICROSCOPIC-ADD ON

## 2014-11-08 LAB — POC URINE PREG, ED: PREG TEST UR: NEGATIVE

## 2014-11-08 MED ORDER — PREDNISONE (PAK) 10 MG PO TABS: ORAL_TABLET | Freq: Every day | ORAL | Status: DC

## 2014-11-08 NOTE — Discharge Instructions (Signed)
Read the information below.  Use the prescribed medication as directed.  Please discuss all new medications with your pharmacist.  You may return to the Emergency Department at any time for worsening condition or any new symptoms that concern you.     If you develop fevers, loss of control of bowel or bladder, weakness or numbness in your legs, or are unable to walk, return to the ER for a recheck.    Back Exercises These exercises may help you when beginning to rehabilitate your injury. Your symptoms may resolve with or without further involvement from your physician, physical therapist or athletic trainer. While completing these exercises, remember:   Restoring tissue flexibility helps normal motion to return to the joints. This allows healthier, less painful movement and activity.  An effective stretch should be held for at least 30 seconds.  A stretch should never be painful. You should only feel a gentle lengthening or release in the stretched tissue. STRETCH - Extension, Prone on Elbows   Lie on your stomach on the floor, a bed will be too soft. Place your palms about shoulder width apart and at the height of your head.  Place your elbows under your shoulders. If this is too painful, stack pillows under your chest.  Allow your body to relax so that your hips drop lower and make contact more completely with the floor.  Hold this position for __________ seconds.  Slowly return to lying flat on the floor. Repeat __________ times. Complete this exercise __________ times per day.  RANGE OF MOTION - Extension, Prone Press Ups   Lie on your stomach on the floor, a bed will be too soft. Place your palms about shoulder width apart and at the height of your head.  Keeping your back as relaxed as possible, slowly straighten your elbows while keeping your hips on the floor. You may adjust the placement of your hands to maximize your comfort. As you gain motion, your hands will come more  underneath your shoulders.  Hold this position __________ seconds.  Slowly return to lying flat on the floor. Repeat __________ times. Complete this exercise __________ times per day.  RANGE OF MOTION- Quadruped, Neutral Spine   Assume a hands and knees position on a firm surface. Keep your hands under your shoulders and your knees under your hips. You may place padding under your knees for comfort.  Drop your head and point your tail bone toward the ground below you. This will round out your low back like an angry cat. Hold this position for __________ seconds.  Slowly lift your head and release your tail bone so that your back sags into a large arch, like an old horse.  Hold this position for __________ seconds.  Repeat this until you feel limber in your low back.  Now, find your "sweet spot." This will be the most comfortable position somewhere between the two previous positions. This is your neutral spine. Once you have found this position, tense your stomach muscles to support your low back.  Hold this position for __________ seconds. Repeat __________ times. Complete this exercise __________ times per day.  STRETCH - Flexion, Single Knee to Chest   Lie on a firm bed or floor with both legs extended in front of you.  Keeping one leg in contact with the floor, bring your opposite knee to your chest. Hold your leg in place by either grabbing behind your thigh or at your knee.  Pull until you feel a  gentle stretch in your low back. Hold __________ seconds.  Slowly release your grasp and repeat the exercise with the opposite side. Repeat __________ times. Complete this exercise __________ times per day.  STRETCH - Hamstrings, Standing  Stand or sit and extend your right / left leg, placing your foot on a chair or foot stool  Keeping a slight arch in your low back and your hips straight forward.  Lead with your chest and lean forward at the waist until you feel a gentle stretch  in the back of your right / left knee or thigh. (When done correctly, this exercise requires leaning only a small distance.)  Hold this position for __________ seconds. Repeat __________ times. Complete this stretch __________ times per day. STRENGTHENING - Deep Abdominals, Pelvic Tilt   Lie on a firm bed or floor. Keeping your legs in front of you, bend your knees so they are both pointed toward the ceiling and your feet are flat on the floor.  Tense your lower abdominal muscles to press your low back into the floor. This motion will rotate your pelvis so that your tail bone is scooping upwards rather than pointing at your feet or into the floor.  With a gentle tension and even breathing, hold this position for __________ seconds. Repeat __________ times. Complete this exercise __________ times per day.  STRENGTHENING - Abdominals, Crunches   Lie on a firm bed or floor. Keeping your legs in front of you, bend your knees so they are both pointed toward the ceiling and your feet are flat on the floor. Cross your arms over your chest.  Slightly tip your chin down without bending your neck.  Tense your abdominals and slowly lift your trunk high enough to just clear your shoulder blades. Lifting higher can put excessive stress on the low back and does not further strengthen your abdominal muscles.  Control your return to the starting position. Repeat __________ times. Complete this exercise __________ times per day.  STRENGTHENING - Quadruped, Opposite UE/LE Lift   Assume a hands and knees position on a firm surface. Keep your hands under your shoulders and your knees under your hips. You may place padding under your knees for comfort.  Find your neutral spine and gently tense your abdominal muscles so that you can maintain this position. Your shoulders and hips should form a rectangle that is parallel with the floor and is not twisted.  Keeping your trunk steady, lift your right hand no  higher than your shoulder and then your left leg no higher than your hip. Make sure you are not holding your breath. Hold this position __________ seconds.  Continuing to keep your abdominal muscles tense and your back steady, slowly return to your starting position. Repeat with the opposite arm and leg. Repeat __________ times. Complete this exercise __________ times per day. Document Released: 08/21/2005 Document Revised: 10/26/2011 Document Reviewed: 11/15/2008 [facility] Patient Information 2015 [place], [place]. This information is not intended to replace advice given to you by your health care provider. Make sure you discuss any questions you have with your health care provider.  Back Pain, Adult Back pain is very common. The pain often gets better over time. The cause of back pain is usually not dangerous. Most people can learn to manage their back pain on their own.  HOME CARE   Stay active. Start with short walks on flat ground if you can. Try to walk farther each day.  Do not sit, drive, or stand  in one place for more than 30 minutes. Do not stay in bed.  Do not avoid exercise or work. Activity can help your back heal faster.  Be careful when you bend or lift an object. Bend at your knees, keep the object close to you, and do not twist.  Sleep on a firm mattress. Lie on your side, and bend your knees. If you lie on your back, put a pillow under your knees.  Only take medicines as told by your doctor.  Put ice on the injured area.  Put ice in a plastic bag.  Place a towel between your skin and the bag.  Leave the ice on for 15-20 minutes, 03-04 times a day for the first 2 to 3 days. After that, you can switch between ice and heat packs.  Ask your doctor about back exercises or massage.  Avoid feeling anxious or stressed. Find good ways to deal with stress, such as exercise. GET HELP RIGHT AWAY IF:   Your pain does not go away with rest or medicine.  Your pain does not go  away in 1 week.  You have new problems.  You do not feel well.  The pain spreads into your legs.  You cannot control when you poop (bowel movement) or pee (urinate).  Your arms or legs feel weak or lose feeling (numbness).  You feel sick to your stomach (nauseous) or throw up (vomit).  You have belly (abdominal) pain.  You feel like you may pass out (faint). MAKE SURE YOU:   Understand these instructions.  Will watch your condition.  Will get help right away if you are not doing well or get worse. Document Released: 01/20/2008 Document Revised: 10/26/2011 Document Reviewed: 12/05/2013 [facility] Patient Information 2015 [place], [place]. This information is not intended to replace advice given to you by your health care provider. Make sure you discuss any questions you have with your health care provider.    Emergency Department Resource Guide 1) Find a Doctor and Pay Out of Pocket Although you won't have to find out who is covered by your insurance plan, it is a good idea to ask around and get recommendations. You will then need to call the office and see if the doctor you have chosen will accept you as a new patient and what types of options they offer for patients who are self-pay. Some doctors offer discounts or will set up payment plans for their patients who do not have insurance, but you will need to ask so you aren't surprised when you get to your appointment.  2) Contact Your Local Health Department Not all health departments have doctors that can see patients for sick visits, but many do, so it is worth a call to see if yours does. If you don't know where your local health department is, you can check in your phone book. The CDC also has a tool to help you locate your state's health department, and many state websites also have listings of all of their local health departments.  3) Find a Walk-in Clinic If your illness is not likely to be very severe or complicated, you  may want to try a walk in clinic. These are popping up all over the country in pharmacies, drugstores, and shopping centers. They're usually staffed by nurse practitioners or physician assistants that have been trained to treat common illnesses and complaints. They're usually fairly quick and inexpensive. However, if you have serious medical issues or chronic medical problems, these  are probably not your best option.  No Primary Care Doctor: - Call Health Connect at  [phone] - they can help you locate a primary care doctor that  accepts your insurance, provides certain services, etc. - Physician Referral Service- [phone]  Chronic Pain Problems: Organization         Address  Phone   Notes  [facility]  [phone] Patients need to be referred by their primary care doctor.   Medication Assistance: Organization         Address  Phone   Notes  [facility] Medication [facility] [address] [place] [place], [place] [address] [phone] --Must be a resident of [facility] -- Must have NO insurance coverage whatsoever (no Medicaid/ Medicare, etc.) -- The pt. MUST have a primary care doctor that directs their care regularly and follows them in the community   [facility]ist  [phone]   [employer]  [phone]    Agencies that provide inexpensive medical care: Organization         Address  Phone   Notes  [facility]  [phone]   [facility]    [phone]   [facility] [address] [place], [place] [address] [phone]   [facility] [address] [place] [place] [phone]   Planned Parenthood    [phone]   [facility]    [phone]   [facility]  [address], [place] Phone:  [phone], Fax:  [phone] Hours of Operation:  9 am - 6 pm, M-F.  Also accepts Medicaid/Medicare and  self-pay.  [facility]  [address], [place] Phone: [phone], Fax: [phone]. Hours of Operation:  8:30 am - 5:30 pm, M-F.  Also accepts Medicaid and self-pay.  [facility] [place] [address], [place] Phone: [phone]   [facility] [address] Natasha Bence [place], [place] [phone], Ext. [phone] Mondays & Thursdays: 7-9 AM.  First 15 patients are seen on a first come, first serve basis.    Medicaid-accepting [facility] Providers:  Organization         Address  Phone   Notes  [facility] [address],  [phone] Also accepts self-pay patients.  [facility] [address] Laurell Josephs [place], [place]  [phone]   [facility] [address], [place] [phone]   [facility] [address] [place] [phone]   Renaye Rakers [address], [place]   [phone] Only accepts [place] Access [place] patients after they have their name applied to their card.   Self-Pay (no insurance) in [facility]:  Organization         Address  Phone   Notes  Sickle Cell Patients, [facility] [address] [place], [place] [phone]   [facility] [address] [place], [place] [phone]   [facility] [place]  [address], [place] [phone]   [facility]/Dr. Osei-Bonsu  [address] [place] or [address], [place] [phone] Phone number for both [place] and [place] locations is the same.  [facility]  [address] [place] [phone]   [facility] [address] [place] or [address] [phone]   [facility] [address] [place] [phone], phone;  [phone], fax Sees patients 1st and 3rd Saturday of every month.  Must not qualify for public or private insurance (i.e. Medicaid, Medicare, [place] Health Choice, Veterans' Benefits)  Household income should be no more than 200% of the poverty level The clinic cannot treat you if you are pregnant or think you are pregnant  Sexually transmitted diseases are not treated at the clinic.    Dental Care: Organization         Address  Phone  Notes  [facility] Department of [facility] [address] [place], [place] [phone] Accepts children up to age 70 who are enrolled in [place] or [place] Health Choice; pregnant women with a Medicaid card; and children who have applied for Medicaid or [place] Health Choice, but were declined, whose parents can pay a reduced fee at time of service.  [facility] Department of [facility]  [address], [place] [phone] Accepts children up to age 31 who are enrolled in [place] or [place] Health Choice; pregnant women with a Medicaid card; and children who have applied for Medicaid or  Health Choice, but were declined, whose parents can pay a reduced fee at time of service.  [facility]  [address] [place], [place] [phone] Patients are seen by appointment only. Walk-ins are not accepted. [facility] will see patients 59 years of age and older. Monday - Tuesday (8am-5pm) Most Wednesdays (8:30-5pm) $30 per visit, cash only  [facility] Adult Dental Access [facility]  [address], [place] [facility] [phone] Patients are seen by appointment only. Walk-ins are not accepted. [facility] will see patients 74 years of age and older. One Wednesday Evening (Monthly: Volunteer Based).  $30 per visit, cash only  [employer]  [phone] for adults; Children under age 40, call Graduate Pediatric Dentistry at [phone]. Children aged 72-14, please call  [phone] to request a pediatric application.  Dental services are provided in all areas of dental care including fillings, crowns and bridges, complete and partial dentures, implants, gum treatment, root canals, and extractions. Preventive care is also provided. Treatment is provided to both adults and children. Patients are selected via a lottery and there is often a waiting list.   [facility] [address] [place]  [phone] [email or web address]   [facility] [address] [place], [place] [phone], Ext. [phone] Second and Fourth Thursday of each month, opens at 6:30 AM; Clinic ends at 9 AM.  Patients are seen on a first-come first-served basis, and a limited number are seen during each clinic.   [facility]  [address] Ether Griffins [place], [place] [phone]   Eligibility Requirements You must have lived in [place], [place], or [place] counties for at least the last three months.   You cannot be eligible for state or federal sponsored [place], including [employer], [place], or [employer].   You generally cannot be eligible for healthcare insurance through your employer.    How to apply: Eligibility screenings are held every Tuesday and Wednesday afternoon from 1:00 pm until 4:00 pm. You do not need an appointment for the interview!  [facility]  [address], [place], [place] [phone]   [facility] Health Department  [phone]   [facility] Health Department  [phone]   [facility]  [phone]    Behavioral Health Resources in the Community: Intensive Outpatient Programs Organization         Address  Phone  Notes  Shavonte Zhao [facility] Services [address], [place], [place] [phone]   [facility] Outpatient [address] [place], [place] [phone]   [facility] [address], [place], [place]   [phone]   [facility] [address],  [place], [place] [phone] or [phone]   Substance Abuse Resources Organization         Address  Phone  Notes  [facility]  [phone]   [facility]  [phone]   [facility]  [phone]   Floydene Flock  [phone]   Residential & Outpatient Substance Abuse Program  [phone]   Psychological Services Organization         Address  Phone  Notes  [facility] Behavioral Health  [phone]   [facility] Services  [phone]   [facility] Mental Health [address] [place] [phone] or [phone]    Mobile Crisis Teams Organization         Address  Phone  Notes  [facility], Mobile Crisis Care Unit  [phone]   [facility]  [address] [place], [place] [phone]   Doristine Locks [address] [place] [phone]    Self-Help/Support Groups Organization         Address  Phone             Notes  [facility] [place] - variety of support groups  336- I7437963 Call for more information  Narcotics Anonymous (NA), [facility] [address], [employer] [place]  2 meetings at this location   Statistician         Address  Phone  Notes  [facility] [address],    [place]  [phone]   [facility]  [address], [place] [address], [place], [place] [phone]   [facility] [address] [place], [place] [phone] Admissions: 8am-3pm M-F  [facility] [address],    [place], [place] [phone]   [facility] [address] [place], [place], [place] [phone]   [facility] [address],  [place], [place] [phone]   [facility] - Intensive Outpatient [address]ure[address] Joseph[address], [place], [place] [phone]   [facility] (Addiction Recovery  Care Assoc.) [address] [place]  [place], [place] [phone] or [phone]   [facility] ([facility]) [address], [place], [place] [phone] Accepts Medicaid  [facility] [address],  [place] [phone] Substance Abuse/Addiction Treatment   [facility] Organization         Address  Phone  Notes  [facility]  [phone]   Angie Fava, PhD [address] Ervin [place], [place]   [phone] or [phone]   [facility] Behavioral   [address] [place], [place] [phone]   [facility] [address] [place], [place] [phone] Insurance/Medicaid/sponsorship through [employer] and Families [address]  [place], [place] [phone] [place], [place] [phone]    Dr. Adele Schilder  [phone]   [facility] Dept. 1) [address] [place] 2) [place] 3)  [address], [place] [phone]  [phone]   [place] [phone] or [phone] (After Hours)

## 2014-11-08 NOTE — ED Notes (Signed)
Pt presents with c/o back pain/spasms that started Monday night of this week. [facility]. Pt reports the pain is not any better. Ambulatory to triage.
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
## 2014-11-14 DIAGNOSIS — Z0279 Encounter for issue of other medical certificate: Secondary | ICD-10-CM

## 2014-11-21 ENCOUNTER — Encounter: Payer: BLUE CROSS/BLUE SHIELD | Admitting: Family Medicine

## 2014-11-27 ENCOUNTER — Emergency Department (HOSPITAL_COMMUNITY)
Admission: EM | Admit: 2014-11-27 | Discharge: 2014-11-27 | Disposition: A | Discharge status: Home / Self Care | Payer: BLUE CROSS/BLUE SHIELD | Attending: Emergency Medicine | Admitting: Emergency Medicine

## 2014-11-27 DIAGNOSIS — Z862 Personal history of diseases of the blood and blood-forming organs and certain disorders involving the immune mechanism: Secondary | ICD-10-CM | POA: Diagnosis not present

## 2014-11-27 DIAGNOSIS — Z79899 Other long term (current) drug therapy: Secondary | ICD-10-CM | POA: Diagnosis not present

## 2014-11-27 DIAGNOSIS — I10 Essential (primary) hypertension: Secondary | ICD-10-CM | POA: Insufficient documentation

## 2014-11-27 DIAGNOSIS — Z87891 Personal history of nicotine dependence: Secondary | ICD-10-CM | POA: Insufficient documentation

## 2014-11-27 DIAGNOSIS — Z791 Long term (current) use of non-steroidal anti-inflammatories (NSAID): Secondary | ICD-10-CM | POA: Insufficient documentation

## 2014-11-27 DIAGNOSIS — R42 Dizziness and giddiness: Secondary | ICD-10-CM | POA: Insufficient documentation

## 2014-11-27 DIAGNOSIS — Z8719 Personal history of other diseases of the digestive system: Secondary | ICD-10-CM | POA: Insufficient documentation

## 2014-11-27 DIAGNOSIS — H53149 Visual discomfort, unspecified: Secondary | ICD-10-CM | POA: Insufficient documentation

## 2014-11-27 DIAGNOSIS — R110 Nausea: Secondary | ICD-10-CM | POA: Diagnosis not present

## 2014-11-27 DIAGNOSIS — R519 Headache, unspecified: Secondary | ICD-10-CM

## 2014-11-27 DIAGNOSIS — R51 Headache: Secondary | ICD-10-CM | POA: Diagnosis present

## 2014-11-27 MED ORDER — SODIUM CHLORIDE 0.9 % IV BOLUS (SEPSIS)
1000.0000 mL | Freq: Once | INTRAVENOUS | Status: AC
  Administered 2014-11-27: 1000 mL via INTRAVENOUS

## 2014-11-27 MED ORDER — METOCLOPRAMIDE HCL 5 MG/ML IJ SOLN
10.0000 mg | INTRAMUSCULAR | Status: AC
  Administered 2014-11-27: 10 mg via INTRAVENOUS
  Filled 2014-11-27: qty 2

## 2014-11-27 MED ORDER — KETOROLAC TROMETHAMINE 30 MG/ML IJ SOLN
30.0000 mg | Freq: Once | INTRAMUSCULAR | Status: AC
  Administered 2014-11-27: 30 mg via INTRAVENOUS
  Filled 2014-11-27: qty 1

## 2014-11-27 NOTE — Discharge Instructions (Signed)

## 2014-11-27 NOTE — ED Notes (Signed)
Awake. Verbally responsive. A/O x4. Resp even and unlabored. No audible adventitious breath sounds noted. ABC's intact. IV saline lock patent and intact. Pt reported pain decrease to 3/10.

## 2014-11-27 NOTE — ED Notes (Signed)
Awake. Verbally responsive. A/O x4. Resp even and unlabored. No audible adventitious breath sounds noted. ABC's intact. Pt reported headache, nausea without vomiting, lightheadedness, and (+) visual disturbances. Family at bedside.

## 2014-11-27 NOTE — ED Notes (Signed)
Awake. Verbally responsive. A/O x4. Resp even and unlabored. No audible adventitious breath sounds noted. ABC's intact.  

## 2014-11-27 NOTE — ED Notes (Signed)
Patient c/o headache since Thursday. Patient states she takes medication for migraines. Patient states she is also having dizziness, causing her to be off balance. Patient taking Fioricet for migraines, has also been taking Meloxicam and Tramadol for pain.

## 2014-11-27 NOTE — ED Notes (Signed)
Awake. Verbally responsive. A/O x4. Resp even and unlabored. No audible adventitious breath sounds noted. ABC's intact. IV infusing NS at 999ml/hr without difficulty. 

## 2014-11-27 NOTE — ED Provider Notes (Signed)
CSN: [id number]     Arrival date & time 11/27/14  0131 History   First MD Initiated Contact with Patient 11/27/14 0248     Chief Complaint  Patient presents with  . Headache    with dizziness    (Consider location/radiation/quality/duration/timing/severity/associated sxs/prior Treatment) HPI Comments: Patient is a 29 year old female with a history of hypertension and headaches who presents to the emergency department for further evaluation of a headache. Patient reports a constant headache with onset 4 days ago. She states that pain has been migrating about her head, but is currently present in her left temple. She reports taking Fioricet as well as for symptoms without relief. She reports associated photophobia, nausea, and dizziness without associated fever, neck pain, syncope, hearing changes, vision changes, extremity numbness/weakness, or recent head injury. She reports a history of similar headaches over the past few months. She has an appointment to see neurology on the 19th. Her headaches, to this point, have been managed by her PCP, Dr. Carver Fila.  Patient is a 29 y.o. female presenting with headaches. The history is provided by the patient. No language interpreter was used.  Headache Associated symptoms: dizziness, nausea and photophobia   Associated symptoms: no fever, no neck pain, no vomiting and no weakness     Past Medical History  Diagnosis Date  . Hypertension   . Pregnancy induced hypertension   . Shortness of breath     with bronchitis  . GERD (gastroesophageal reflux disease)     pregnancy related  . Anemia     1st pregnancy  . 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 10/05/2012  . Multiple gestation with malpresentation of one fetus or more 10/05/2012  . S/P cesarean section 10/06/2012  . Abnormal Pap smear   . Transient hypertension of pregnancy, with delivery 10/19/2012   Past Surgical History  Procedure Laterality Date  . Wisdom tooth extraction     . Cesarean section N/A 10/06/2012    Procedure: CESAREAN SECTION;  Surgeon: Sherron Monday, MD;  Location: [facility];  Service: Obstetrics;  Laterality: N/A;  PRIMARY C/S-TWINS  . Bilateral salpingectomy Bilateral 10/06/2012    Procedure: BILATERAL SALPINGECTOMY;  Surgeon: Sherron Monday, MD;  Location: [facility];  Service: Gynecology;  Laterality: Bilateral;  . Tubal ligation    . Carpal tunnel release Bilateral 04/12/2014    Procedure: RIGHT CARPAL TUNNEL RELEASE, LEFT CARPAL TUNNEL RELEASE;  Surgeon: Cindee Salt, MD;  Location: [facility];  Service: Orthopedics;  Laterality: Bilateral;   Family History  Problem Relation Age of Onset  . Hypertension Mother   . Asthma Mother   . Hypertension Father   . Cancer Father   . Hypertension Maternal Grandmother   . Diabetes Maternal Grandmother   . Cancer Maternal Grandfather   . Cancer Paternal Grandmother   . Hypertension Paternal Grandmother    History  Substance Use Topics  . Smoking status: Former Smoker -- 0.15 packs/day for 3 years    Types: Cigarettes    Quit date: 07/18/2009  . Smokeless tobacco: Never Used  . Alcohol Use: 2.4 oz/week    2 Cans of beer, 2 Shots of liquor per week     Comment: drinks on weekends   OB History    Gravida Para Term Preterm AB TAB SAB Ectopic Multiple Living   Review of Systems  Constitutional: Negative for fever.  HENT: Negative for trouble swallowing.  Eyes: Positive for photophobia. Negative for visual disturbance.  Gastrointestinal: Positive for nausea. Negative for vomiting.  Musculoskeletal: Negative for neck pain.  Neurological: Positive for dizziness and headaches. Negative for syncope, speech difficulty and weakness.  All other systems reviewed and are negative.   Allergies  Review of patient's allergies indicates no known allergies.  Home Medications   Prior to Admission medications   Medication Sig Start Date End Date Taking? Authorizing Provider   butalbital-acetaminophen-caffeine (FIORICET) 50-325-40 MG per tablet Take 1-2 tablets by mouth every 4 (four) hours as needed for headache. Max 6 in 24 hours 10/16/14 10/16/15 Yes Veryl Speak, FNP  cyclobenzaprine (FLEXERIL) 10 MG tablet Take 1 tablet (10 mg total) by mouth 3 (three) times daily as needed for muscle spasms. 11/06/14  Yes Tishira R Brewington, PA-C  meloxicam (MOBIC) 15 MG tablet Take 1 tablet (15 mg total) by mouth daily. 11/06/14  Yes Tishira R Brewington, PA-C  Multiple Vitamin (MULTIVITAMIN WITH MINERALS) TABS tablet Take 1 tablet by mouth daily.   Yes Historical Provider, MD  promethazine (PHENERGAN) 12.5 MG tablet Take 12.5 mg by mouth every 6 (six) hours as needed for nausea or vomiting.   Yes Historical Provider, MD  SUMAtriptan (IMITREX) 25 MG tablet Take 25 mg by mouth every 2 (two) hours as needed for migraine. May repeat in 2 hours if headache persists or recurs.   Yes Historical Provider, MD  traMADol (ULTRAM) 50 MG tablet Take 1 tablet (50 mg total) by mouth every 8 (eight) hours as needed. 11/06/14  Yes Tishira R Brewington, PA-C  predniSONE (STERAPRED UNI-PAK) 10 MG tablet Take by mouth daily. Day 1: take 6 tabs.  Day 2: 5 tabs  Day 3: 4 tabs  Day 4: 3 tabs  Day 5: 2 tabs  Day 6: 1 tab Patient not taking: Reported on 11/27/2014 11/08/14   Trixie Dredge, PA-C   BP 117/80 mmHg  Pulse 80  Temp(Src) 98.1 F (36.7 C) (Oral)  Resp 16  Ht  (1.702 m)  Wt 179 lb (81.194 kg)  BMI 28.03 kg/m2  SpO2 99%  LMP 11/02/2014 (Approximate)   Physical Exam  Constitutional: She is oriented to person, place, and time. She appears well-developed and well-nourished. No distress.  Nontoxic/nonseptic appearing  HENT:  Head: Normocephalic and atraumatic.  Mouth/Throat: Oropharynx is clear and moist. No oropharyngeal exudate.  Eyes: Conjunctivae and EOM are normal. Pupils are equal, round, and reactive to light. No scleral icterus.  Neck: Normal range of motion.  No nuchal rigidity  or meningismus  Cardiovascular: Normal rate, regular rhythm and intact distal pulses.   Pulmonary/Chest: Effort normal. No respiratory distress. She has no wheezes.  Respirations even and unlabored  Musculoskeletal: Normal range of motion.  Neurological: She is alert and oriented to person, place, and time. No cranial nerve deficit. She exhibits normal muscle tone. Coordination normal.  GCS 15. Speech is goal oriented. No cranial nerve deficits appreciated; symmetric eyebrow raise, no facial drooping, tongue midline. Patient has equal grip strength bilaterally and 5/5 strength against resistance in all major muscle groups bilaterally. Sensation to light touch intact. Patient moves extremities without ataxia. She ambulates with steady gait.  Skin: Skin is warm and dry. No rash noted. She is not diaphoretic. No erythema. No pallor.  Psychiatric: She has a normal mood and affect. Her behavior is normal.  Nursing note and vitals reviewed.   ED Course  Procedures (including critical care time) Labs Review Labs Reviewed - No data to display  Imaging Review No results found.   EKG Interpretation None      MDM   Final diagnoses:  Acute nonintractable headache, unspecified headache type    29 year old nontoxic-appearing female presents to the emergency department for further evaluation of headache. She reports a history of similar headaches in the past with plan to follow-up with a neurologist in one week. Patient is afebrile and without nuchal rigidity or meningismus. Doubt meningitis. No history of head injury or trauma. Neurologic exam nonfocal. Doubt emergent intracranial process. No indication for imaging at this time.  Symptoms managed with IV fluids as well as Toradol and Reglan. Patient states that her pain has improved to 3/10 from 8/10 on arrival. Patient states that she feels comfortable managing her symptoms further at home. She also has plans to follow up with her primary care  doctor today. Patient is reliable for follow-up. Patient stable for discharge at this time. Return precautions given. Patient discharged in good condition with no unaddressed concerns.   Filed Vitals:   11/27/14 0218 11/27/14 0500 11/27/14 0600  BP: 149/97 119/67 117/80  Pulse: 70 83 80  Temp: 98.1 F (36.7 C)    TempSrc: Oral    Resp: 16    Height: 5\' 7"  (1.702 m)    Weight: 179 lb (81.194 kg)    SpO2: 100% 100% 99%     Antony MaduraKelly Neasia Fleeman, PA-C 11/27/14 0636  Elwin MochaBlair Walden, MD 11/28/14 [phone]

## 2014-12-04 ENCOUNTER — Ambulatory Visit (INDEPENDENT_AMBULATORY_CARE_PROVIDER_SITE_OTHER): Payer: BLUE CROSS/BLUE SHIELD | Admitting: Neurology

## 2014-12-04 ENCOUNTER — Encounter: Payer: Self-pay | Admitting: Neurology

## 2014-12-04 VITALS — BP 140/78 | HR 74 | Resp 20 | Ht 67.0 in | Wt 182.4 lb

## 2014-12-04 DIAGNOSIS — G43109 Migraine with aura, not intractable, without status migrainosus: Secondary | ICD-10-CM | POA: Diagnosis not present

## 2014-12-04 MED ORDER — PROPRANOLOL HCL 40 MG PO TABS: 40.0000 mg | ORAL_TABLET | Freq: Two times a day (BID) | ORAL | Status: DC

## 2014-12-04 MED ORDER — ELETRIPTAN HYDROBROMIDE 40 MG PO TABS: ORAL_TABLET | ORAL | Status: DC

## 2014-12-04 NOTE — Patient Instructions (Addendum)
1.  Start propranolol 40mg  twice daily 2.  At earliest onset of headache, take 1 tablet of Relpax.  May repeat 1 tablet in 2 hours if needed. 3.  Stop butalbital 4.  Follow up in 4 weeks.

## 2014-12-04 NOTE — Progress Notes (Signed)
NEUROLOGY CONSULTATION NOTE  Rachel [name] MRN: [id number] DOB: July 14, 1986  Referring provider: Jeanine LuzGregory Calone FNP Primary care provider: Jeanine LuzGregory Calone, FNP  Reason for consult:  headache  HISTORY OF PRESENT ILLNESS: Rachel [name] is a 29 year old right-handed woman with hypertension who presents for migraine.  Records and prior CT of head reviewed.    Onset:  For a few years (at least 2013) but worse over past 2 months Location:  Bi-frontal (left worse than right) Quality:  squeezing Intensity:  8/10, sometimes 10/10 Aura:  Black spots in her vision Prodrome:  no Associated symptoms:  Photophobia, phonophobia, feels off-balance, once had nausea Duration:  2 hours to all day Frequency:  2-3 times a week Triggers/exacerbating factors:  none Relieving factors:  none Activity:  Able to function but called out 4 times from work in last 2 months  Past abortive therapy:  sumatriptan 50mg  (ineffective), Excedrin migraine Past preventative therapy:  none  Current abortive therapy:  Fioricet, Phenergan 12.5mg  (not needed) Current preventative therapy:  None Other medication:  For back pain, takes tramadol and Flexeril  CT of head performed in 2013 for headache was normal.  Caffeine:  Decreased Alcohol:  Occasionally on weekends Smoker:  former Diet:  Stays hydrated Exercise:  walks Depression/stress:  Stress related to caring for twin toddlers and 28 year old son.  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  She is also worried because her blood pressure has been elevated. Sleep hygiene:  poor Family history of headache:  No family history.  No family history of aneurysms.  PAST MEDICAL HISTORY: Past Medical History  Diagnosis Date  . Hypertension   . Pregnancy induced hypertension   . Shortness of breath     with bronchitis  . GERD (gastroesophageal reflux disease)     pregnancy related  . Anemia     1st pregnancy  .  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 10/05/2012  . Multiple gestation with malpresentation of one fetus or more 10/05/2012  . S/P cesarean section 10/06/2012  . Abnormal Pap smear   . Transient hypertension of pregnancy, with delivery 10/19/2012  . Headache     PAST SURGICAL HISTORY: Past Surgical History  Procedure Laterality Date  . Wisdom tooth extraction    . Cesarean section N/A 10/06/2012    Procedure: CESAREAN SECTION;  Surgeon: Sherron MondayJody Bovard, MD;  Location: [facility];  Service: Obstetrics;  Laterality: N/A;  PRIMARY C/S-TWINS  . Bilateral salpingectomy Bilateral 10/06/2012    Procedure: BILATERAL SALPINGECTOMY;  Surgeon: Sherron MondayJody Bovard, MD;  Location: [facility];  Service: Gynecology;  Laterality: Bilateral;  . Tubal ligation    . Carpal tunnel release Bilateral 04/12/2014    Procedure: RIGHT CARPAL TUNNEL RELEASE, LEFT CARPAL TUNNEL RELEASE;  Surgeon: Cindee SaltGary Kuzma, MD;  Location: [facility];  Service: Orthopedics;  Laterality: Bilateral;    MEDICATIONS: Current Outpatient Prescriptions on File Prior to Visit  Medication Sig Dispense Refill  . cyclobenzaprine (FLEXERIL) 10 MG tablet Take 1 tablet (10 mg total) by mouth 3 (three) times daily as needed for muscle spasms. 30 tablet 0  . meloxicam (MOBIC) 15 MG tablet Take 1 tablet (15 mg total) by mouth daily. 30 tablet 1  . Multiple Vitamin (MULTIVITAMIN WITH MINERALS) TABS tablet Take 1 tablet by mouth daily.    . promethazine (PHENERGAN) 12.5 MG tablet Take 12.5 mg by mouth every 6 (six) hours as needed for nausea or  vomiting.    . traMADol (ULTRAM) 50 MG tablet Take 1 tablet (50 mg total) by mouth every 8 (eight) hours as needed. 30 tablet 0  . predniSONE (STERAPRED UNI-PAK) 10 MG tablet Take by mouth daily. Day 1: take 6 tabs.  Day 2: 5 tabs  Day 3: 4 tabs  Day 4: 3 tabs  Day 5: 2 tabs  Day 6: 1 tab (Patient not taking: Reported on 11/27/2014) 21 tablet 0  . SUMAtriptan (IMITREX) 25 MG tablet Take 25 mg by mouth  every 2 (two) hours as needed for migraine. May repeat in 2 hours if headache persists or recurs.     No current facility-administered medications on file prior to visit.    ALLERGIES: No Known Allergies  FAMILY HISTORY: Family History  Problem Relation Age of Onset  . Hypertension Mother   . Asthma Mother   . Hypertension Father   . Cancer Father     esophageal   . Hypertension Maternal Grandmother   . Diabetes Maternal Grandmother   . Cancer Maternal Grandfather   . Cancer Paternal Grandmother   . Hypertension Paternal Grandmother     SOCIAL HISTORY: History   Social History  . Marital Status: Single    Spouse Name: N/A  . Number of Children: 3  . Years of Education: 14   Occupational History  . Production operator    Social History Main Topics  . Smoking status: Former Smoker -- 0.15 packs/day for 3 years    Types: Cigarettes    Quit date: 07/18/2009  . Smokeless tobacco: Never Used  . Alcohol Use: 2.4 oz/week    2 Cans of beer, 2 Shots of liquor per week     Comment: drinks on weekends  . Drug Use: No  . Sexual Activity:    Partners: Male    Birth Control/ Protection: Surgical   Other Topics Concern  . Not on file   Social History Narrative   Born in [place] and raised in [place]. Currently resides in a townhouse 3 children and boyfriend. No pets. Fun: ride a motorcycle.   Denies religious beliefs to effect health care.     REVIEW OF SYSTEMS: Constitutional: No fevers, chills, or sweats, no generalized fatigue, change in appetite Eyes: No visual changes, double vision, eye pain Ear, nose and throat: No hearing loss, ear pain, nasal congestion, sore throat Cardiovascular: No chest pain, palpitations Respiratory:  No shortness of breath at rest or with exertion, wheezes GastrointestinaI: No nausea, vomiting, diarrhea, abdominal pain, fecal incontinence Genitourinary:  No dysuria, urinary retention or frequency Musculoskeletal:  No neck pain, back  pain Integumentary: No rash, pruritus, skin lesions Neurological: as above Psychiatric: No depression, insomnia, anxiety Endocrine: No palpitations, fatigue, diaphoresis, mood swings, change in appetite, change in weight, increased thirst Hematologic/Lymphatic:  No anemia, purpura, petechiae. Allergic/Immunologic: no itchy/runny eyes, nasal congestion, recent allergic reactions, rashes  PHYSICAL EXAM: Filed Vitals:   12/04/14 1004  BP: 140/78  Pulse: 74  Resp: 20   General: No acute distress Head:  Normocephalic/atraumatic Eyes:  fundi unremarkable, without vessel changes, exudates, hemorrhages or papilledema. Neck: supple, no paraspinal tenderness, full range of motion Back: No paraspinal tenderness Heart: regular rate and rhythm Lungs: Clear to auscultation bilaterally. Vascular: No carotid bruits. Neurological Exam: Mental status: alert and oriented to person, place, and time, recent and remote memory intact, fund of knowledge intact, attention and concentration intact, speech fluent and not dysarthric, language intact. Cranial nerves: CN I: not tested CN II:  pupils equal, round and reactive to light, visual fields intact, fundi unremarkable, without vessel changes, exudates, hemorrhages or papilledema. CN III, IV, VI:  full range of motion, no nystagmus, no ptosis CN V: facial sensation intact CN VII: upper and lower face symmetric CN VIII: hearing intact CN IX, X: gag intact, uvula midline CN XI: sternocleidomastoid and trapezius muscles intact CN XII: tongue midline Bulk & Tone: normal, no fasciculations. Motor:  5/5 throughout Sensation:  Temperature and vibration intact Deep Tendon Reflexes:  2+ throughout, toes downgoing Finger to nose testing:  No dysmetria Heel to shin:  No dysmetria Gait:  Normal station and stride.  Able to turn and walk in tandem. Romberg negative.  IMPRESSION: Frequent migraine with visual aura  PLAN: 1.  Start propranolol  twice  daily 2.  For rescue therapy, try Relpax .  Stop Fioricet. 3.  Follow up in 4 weeks.  Thank you for allowing me to take part in the care of this patient.  Shon Millet, DO  CC:  Jeanine Luz, FNP

## 2014-12-24 ENCOUNTER — Telehealth: Payer: Self-pay | Admitting: Neurology

## 2014-12-24 ENCOUNTER — Other Ambulatory Visit: Payer: Self-pay | Admitting: *Deleted

## 2014-12-24 MED ORDER — ELETRIPTAN HYDROBROMIDE 40 MG PO TABS: 40.0000 mg | ORAL_TABLET | ORAL | Status: DC | PRN

## 2014-12-24 NOTE — Telephone Encounter (Signed)
Patient is aware of medication refill 

## 2014-12-24 NOTE — Telephone Encounter (Signed)
Pt reports she has ran out of Relpax and has no refills. Her follow up is in June. Does she need to continue this med? Please call pt @ [phone] / Oneita KrasSherri S.

## 2014-12-24 NOTE — Telephone Encounter (Signed)
Patient is aware that medication has been called in for her Relpax # [phone] with 2 refills .

## 2014-12-24 NOTE — Telephone Encounter (Signed)
Pt states that someone called her and she was not sure who called but could be about medication please call [phone]

## 2014-12-25 ENCOUNTER — Telehealth: Payer: Self-pay | Admitting: Family

## 2014-12-25 NOTE — Telephone Encounter (Signed)
Pt called in and has a few question for greg.  She went to the neurologist and he gave her few options.  She would like to speak with Tammy SoursGreg about those options   Best number [phone]

## 2014-12-27 NOTE — Telephone Encounter (Signed)
Patient called again regarding note below.

## 2014-12-27 NOTE — Telephone Encounter (Signed)
Narrow all meds down. Not used to taking a lot of meds. Neurologist put her on bp meds. Bp, seizure, depression/stress meds cause headaches? Feels like more depressed than anything. Relpax (eletriptan) 40 mg for migraines only allowed 9 pills. 12 pills a month for headaches how is that supposed to help. Doesn't feel like she is getting enough pills and doesn't need to be taking. Told pt to come in for OV. Pt scheduled

## 2015-01-01 ENCOUNTER — Ambulatory Visit: Payer: BLUE CROSS/BLUE SHIELD | Admitting: Family

## 2015-01-01 DIAGNOSIS — Z0289 Encounter for other administrative examinations: Secondary | ICD-10-CM

## 2015-01-07 ENCOUNTER — Ambulatory Visit: Payer: BLUE CROSS/BLUE SHIELD | Admitting: Family

## 2015-01-09 ENCOUNTER — Ambulatory Visit (INDEPENDENT_AMBULATORY_CARE_PROVIDER_SITE_OTHER): Payer: BLUE CROSS/BLUE SHIELD | Admitting: Family

## 2015-01-09 ENCOUNTER — Encounter: Payer: Self-pay | Admitting: Family

## 2015-01-09 VITALS — BP 120/90 | HR 70 | Temp 97.9°F | Resp 18 | Ht 67.0 in | Wt 180.1 lb

## 2015-01-09 DIAGNOSIS — F329 Major depressive disorder, single episode, unspecified: Secondary | ICD-10-CM | POA: Diagnosis not present

## 2015-01-09 DIAGNOSIS — R4589 Other symptoms and signs involving emotional state: Secondary | ICD-10-CM

## 2015-01-09 DIAGNOSIS — F322 Major depressive disorder, single episode, severe without psychotic features: Secondary | ICD-10-CM | POA: Insufficient documentation

## 2015-01-09 DIAGNOSIS — G43109 Migraine with aura, not intractable, without status migrainosus: Secondary | ICD-10-CM | POA: Diagnosis not present

## 2015-01-09 MED ORDER — PROPRANOLOL HCL 40 MG PO TABS: 40.0000 mg | ORAL_TABLET | Freq: Two times a day (BID) | ORAL | Status: DC

## 2015-01-09 MED ORDER — ELETRIPTAN HYDROBROMIDE 40 MG PO TABS: 40.0000 mg | ORAL_TABLET | ORAL | Status: DC | PRN

## 2015-01-09 NOTE — Progress Notes (Signed)
Subjective:    Patient ID: Rachel [name], female    DOB: 1985/10/05, 29 y.o.   MRN: [id number]  Chief Complaint  Patient presents with  . Follow-up    Still having headaches, thinks it could be from working 3rd shift and getting off and taking care of kids, also feels like she has depression and wants to see about getting on an anti depressant, feels moody and constantly irritated    HPI:  Rachel [name] is a 29 y.o. female with a PMH of migraines and hypertension who presents today for an office follow-up.  1.) Migraines - Recently seen by neurology for her migraine headaches and was prescribed Relpax for exacerbations and propanolol for headache prevention. Notes she continues to experience the associated symptom of headaches. Has taken the propanolol for about 2 weeks as prescribed as did not notice any significant effects so she stopped taking it. Indicates that the Relpax did help manage her headaches. This week she has been okay because she has not been at work and has had less headaches.   2.) Feeling depressed - Associated symptom of feeling down which has been going on for several years. Notes that she has increased stress and depression from working 3rd shift and notes that she feels as though she is always sleeping and her mood is "on the edge". She has worked 3rd shift for the last 2 years. There is limitations to switching and not easily switched to a day shift without changing jobs.    No Known Allergies   Current Outpatient Prescriptions on File Prior to Visit  Medication Sig Dispense Refill  . Multiple Vitamin (MULTIVITAMIN WITH MINERALS) TABS tablet Take 1 tablet by mouth daily.     No current facility-administered medications on file prior to visit.    Review of Systems  Eyes: Negative for visual disturbance.  Neurological: Positive for headaches.  Psychiatric/Behavioral: Positive for dysphoric mood.      Objective:    BP 120/90 mmHg  Pulse 70   Temp(Src) 97.9 F (36.6 C) (Oral)  Resp 18  Ht 5\' 7"  (1.702 m)  Wt 180 lb 1.9 oz (81.702 kg)  BMI 28.20 kg/m2  SpO2 98% Nursing note and vital signs reviewed.  Physical Exam  Constitutional: She is oriented to person, place, and time. She appears well-developed and well-nourished. No distress.  Cardiovascular: Normal rate, regular rhythm, normal heart sounds and intact distal pulses.   Pulmonary/Chest: Effort normal and breath sounds normal.  Neurological: She is alert and oriented to person, place, and time.  Skin: Skin is warm and dry.  Psychiatric: She has a normal mood and affect. Her behavior is normal. Judgment and thought content normal.       Assessment & Plan:   Problem List Items Addressed This Visit      Cardiovascular and Mediastinum   Migraine with aura and without status migrainosus, not intractable - Primary    Recently seen by neurology and prescribed Relpax and propanolol. Discussed the usage and rationale of these medications in headache prevention and abortion. Continue current dosage of propanolol and Relpax. Discussed possibilities of other medications used for migraine prophylaxis. Discuss with neurology potential changes to her regimen to possibly include antidepressive medications if needed or if no results are achieved with propanolol.      Relevant Medications   propranolol (INDERAL) 40 MG tablet   eletriptan (RELPAX) 40 MG tablet     Other   Depressed mood    Symptoms most  likely consistent with shift work disorder. Discussed possibilities of changing jobs and obtaining work during regular working hours. Indicates she is sleeping adequately, however notes that she feels as though she is missing out on family functions secondary to fatigue and needing to sleep. Continue to monitor without medication at this time. Consider changing positions to a day shift position or medication as needed.

## 2015-01-09 NOTE — Assessment & Plan Note (Signed)
Recently seen by neurology and prescribed Relpax and propanolol. Discussed the usage and rationale of these medications in headache prevention and abortion. Continue current dosage of propanolol and Relpax. Discussed possibilities of other medications used for migraine prophylaxis. Discuss with neurology potential changes to her regimen to possibly include antidepressive medications if needed or if no results are achieved with propanolol.

## 2015-01-09 NOTE — Patient Instructions (Signed)
Thank you for choosing [facility].  Summary/Instructions:  Please continue to take your medications as prescribed and follow up with Dr. Everlena CooperJaffe.   If your symptoms worsen or fail to improve, please contact our office for further instruction, or in case of emergency go directly to the emergency room at the closest medical facility.

## 2015-01-09 NOTE — Assessment & Plan Note (Signed)
Symptoms most likely consistent with shift work disorder. Discussed possibilities of changing jobs and obtaining work during regular working hours. Indicates she is sleeping adequately, however notes that she feels as though she is missing out on family functions secondary to fatigue and needing to sleep. Continue to monitor without medication at this time. Consider changing positions to a day shift position or medication as needed.

## 2015-01-09 NOTE — Progress Notes (Signed)
Pre visit review using our clinic review tool, if applicable. No additional management support is needed unless otherwise documented below in the visit note. 

## 2015-02-01 ENCOUNTER — Encounter: Payer: Self-pay | Admitting: Neurology

## 2015-02-01 ENCOUNTER — Ambulatory Visit (INDEPENDENT_AMBULATORY_CARE_PROVIDER_SITE_OTHER): Payer: BLUE CROSS/BLUE SHIELD | Admitting: Neurology

## 2015-02-01 VITALS — BP 134/76 | HR 80 | Resp 18 | Ht 67.0 in | Wt 180.4 lb

## 2015-02-01 DIAGNOSIS — G43109 Migraine with aura, not intractable, without status migrainosus: Secondary | ICD-10-CM | POA: Diagnosis not present

## 2015-02-01 MED ORDER — VENLAFAXINE HCL ER 37.5 MG PO CP24: ORAL_CAPSULE | ORAL | Status: DC

## 2015-02-01 MED ORDER — RIZATRIPTAN BENZOATE 10 MG PO TBDP: 10.0000 mg | ORAL_TABLET | ORAL | Status: DC | PRN

## 2015-02-01 NOTE — Progress Notes (Signed)
NEUROLOGY FOLLOW UP OFFICE NOTE  Rachel [name] [id number]  HISTORY OF PRESENT ILLNESS: Rachel [name] is a 29 year old right-handed woman with hypertension who follows up for frequent migraines.  UPDATE: No change. Relpax was too expensive. Current preventative therapy:  None.  She took propranolol  twice daily for only two weeks and stopped because it was ineffective. Unfortunately, she did not contact us with this information. She feels stressed and a little depressed.  She is working third shift and is exhausted.  HISTORY: Onset:  For a few years (at least 2013) but worse over past 2 months Location:  Bi-frontal (left worse than right) Quality:  squeezing Initial Intensity:  8/10, sometimes 10/10 Aura:  Black spots in her vision Prodrome:  no Associated symptoms:  Photophobia, phonophobia, feels off-balance, once had nausea Initial Duration:  2 hours to all day Initial Frequency:  2-3 times a week Triggers/exacerbating factors:  none Relieving factors:  none Activity:  Able to function but called out 4 times from work in last 2 months  Past abortive therapy:  sumatriptan  (ineffective), Excedrin migraine, Fioricet Past preventative therapy:  none  CT of head performed in 2013 for headache was normal.  Caffeine:  Decreased Alcohol:  Occasionally on weekends Smoker:  former Diet:  Stays hydrated Exercise:  walks Depression/stress:  Stress related to caring for twin toddlers and 18 year old son.  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  She is also worried because her blood pressure has been elevated. Sleep hygiene:  poor Family history of headache:  No family history.  No family history of aneurysms.  PAST MEDICAL HISTORY: Past Medical History  Diagnosis Date  . Hypertension   . Pregnancy induced hypertension   . Shortness of breath     with bronchitis  . GERD (gastroesophageal reflux disease)     pregnancy  related  . Anemia     1st pregnancy  . 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 10/05/2012  . Multiple gestation with malpresentation of one fetus or more 10/05/2012  . S/P cesarean section 10/06/2012  . Abnormal Pap smear   . Transient hypertension of pregnancy, with delivery 10/19/2012  . Headache     MEDICATIONS: Current Outpatient Prescriptions on File Prior to Visit  Medication Sig Dispense Refill  . Multiple Vitamin (MULTIVITAMIN WITH MINERALS) TABS tablet Take 1 tablet by mouth daily.    . propranolol (INDERAL) 40 MG tablet Take 1 tablet (40 mg total) by mouth 2 (two) times daily. (Patient not taking: Reported on 02/01/2015) 60 tablet 0   No current facility-administered medications on file prior to visit.    ALLERGIES: No Known Allergies  FAMILY HISTORY: Family History  Problem Relation Age of Onset  . Hypertension Mother   . Asthma Mother   . Hypertension Father   . Cancer Father     esophageal   . Hypertension Maternal Grandmother   . Diabetes Maternal Grandmother   . Cancer Maternal Grandfather   . Cancer Paternal Grandmother   . Hypertension Paternal Grandmother     SOCIAL HISTORY: History   Social History  . Marital Status: Single    Spouse Name: N/A  . Number of Children: 3  . Years of Education: 14   Occupational History  . Production operator    Social History Main Topics  . Smoking status: Former Smoker -- 0.15 packs/day for 3 years    Types: Cigarettes  Quit date: 07/18/2009  . Smokeless tobacco: Never Used  . Alcohol Use: 2.4 oz/week    2 Cans of beer, 2 Shots of liquor per week     Comment: drinks on weekends  . Drug Use: No  . Sexual Activity:    Partners: Male    Birth Control/ Protection: Surgical   Other Topics Concern  . Not on file   Social History Narrative   Born in [place] and raised in [place]. Currently resides in a townhouse 3 children and boyfriend. No pets. Fun: ride a motorcycle.   Denies religious  beliefs to effect health care.     REVIEW OF SYSTEMS: Constitutional: No fevers, chills, or sweats, no generalized fatigue, change in appetite Eyes: No visual changes, double vision, eye pain Ear, nose and throat: No hearing loss, ear pain, nasal congestion, sore throat Cardiovascular: No chest pain, palpitations Respiratory:  No shortness of breath at rest or with exertion, wheezes GastrointestinaI: No nausea, vomiting, diarrhea, abdominal pain, fecal incontinence Genitourinary:  No dysuria, urinary retention or frequency Musculoskeletal:  No neck pain, back pain Integumentary: No rash, pruritus, skin lesions Neurological: as above Psychiatric: No depression, insomnia, anxiety Endocrine: No palpitations, fatigue, diaphoresis, mood swings, change in appetite, change in weight, increased thirst Hematologic/Lymphatic:  No anemia, purpura, petechiae. Allergic/Immunologic: no itchy/runny eyes, nasal congestion, recent allergic reactions, rashes  PHYSICAL EXAM: Filed Vitals:   02/01/15 1532  BP: 134/76  Pulse: 80  Resp: 18   General: No acute distress Head:  Normocephalic/atraumatic Eyes:  Fundoscopic exam unremarkable without vessel changes, exudates, hemorrhages or papilledema. Neck: supple, no paraspinal tenderness, full range of motion Heart:  Regular rate and rhythm Lungs:  Clear to auscultation bilaterally Back: No paraspinal tenderness Neurological Exam: alert and oriented to person, place, and time. Attention span and concentration intact, recent and remote memory intact, fund of knowledge intact.  Speech fluent and not dysarthric, language intact.  CN II-XII intact. Fundoscopic exam unremarkable without vessel changes, exudates, hemorrhages or papilledema.  Bulk and tone normal, muscle strength 5/5 throughout.  Sensation to light touch, temperature and vibration intact.  Deep tendon reflexes 2+ throughout, toes downgoing.  Finger to nose and heel to shin testing intact.  Gait  normal, Romberg negative.  IMPRESSION: Migraine with visual aura   PLAN: Start venlafaxine ER 37.5mg  daily x7d, then 75mg  daily Maxalt 10mg  Call in 4 weeks with update Follow up in 3 months.  15 minutes spent face to face with patient, over 50% spent discussing management.  Shon Millet, DO  CC:  Jeanine Luz, FNP

## 2015-02-01 NOTE — Patient Instructions (Signed)
Migraine Recommendations: 1.  Start venlafaxine ER 37.5mg .  Take 1capsule daily for 7 days, then 2 capsules daily.  Call in 4 weeks with update and we can adjust dose if needed. 2.  Take Maxalt 10mg  at earliest onset of headache.  May repeat dose once in 2 hours if needed.  Do not exceed two tablets in 24 hours. 3.  Limit use of pain relievers to no more than 2 to 3 days out of the week.  These medications include acetaminophen, ibuprofen, triptans and narcotics.  This will help reduce risk of rebound headaches. 4.  Be aware of common food triggers such as processed sweets, processed foods with nitrites (such as deli meat, hot dogs, sausages), foods with MSG, alcohol (such as wine), chocolate, certain cheeses, certain fruits (dried fruits, some citrus fruit), vinegar, diet soda. 4.  Avoid caffeine 5.  Routine exercise 6.  Proper sleep hygiene 7.  Stay adequately hydrated with water 8.  Keep a headache diary. 9.  Maintain proper stress management.

## 2015-04-10 ENCOUNTER — Telehealth: Payer: Self-pay | Admitting: Family

## 2015-04-10 NOTE — Telephone Encounter (Signed)
Patient Name: Rachel [name] DOB: 1986-01-31 Initial Comment Caller states, chest pains Nurse Assessment Nurse: Yetta Barre, RN, Miranda Date/Time (Eastern Time): 04/10/2015 8:31:23 AM Confirm and document reason for call. If symptomatic, describe symptoms. ---Caller states she has had pain in the right side of her chest off and on for 1 week. She is having constant pain and stiffness. Has the patient traveled out of the country within the last 30 days? ---Not Applicable Does the patient require triage? ---Yes Related visit to physician within the last 2 weeks? ---No Does the PT have any chronic conditions? (i.e. diabetes, asthma, etc.) ---Yes List chronic conditions. ---Depression, Migraines Did the patient indicate they were pregnant? ---No Guidelines Guideline Title Affirmed Question Affirmed Notes Chest Pain Chest pain lasts > 5 minutes (Exceptions: chest pain occurring > 3 days ago and now asymptomatic; same as previously diagnosed heartburn and has accompanying sour taste in mouth) Final Disposition User Go to ED Now (or PCP triage) Yetta Barre, RN, Miranda Comments Caller is having moderate - severe constant check pain, that does get worse if she take a deep breathing. NO appt available in the office within the recommended time frame. Told caller to go to UC. Referrals [facility] Disagree/Comply: Comply

## 2015-04-14 ENCOUNTER — Emergency Department (INDEPENDENT_AMBULATORY_CARE_PROVIDER_SITE_OTHER)
Admission: EM | Admit: 2015-04-14 | Discharge: 2015-04-14 | Disposition: A | Discharge status: Home / Self Care | Payer: BLUE CROSS/BLUE SHIELD | Source: Home / Self Care | Attending: Family Medicine | Admitting: Family Medicine

## 2015-04-14 ENCOUNTER — Encounter (HOSPITAL_COMMUNITY): Payer: Self-pay | Admitting: Emergency Medicine

## 2015-04-14 DIAGNOSIS — S39012A Strain of muscle, fascia and tendon of lower back, initial encounter: Secondary | ICD-10-CM

## 2015-04-14 MED ORDER — CYCLOBENZAPRINE HCL 5 MG PO TABS: 5.0000 mg | ORAL_TABLET | Freq: Three times a day (TID) | ORAL | Status: DC | PRN

## 2015-04-14 MED ORDER — KETOROLAC TROMETHAMINE 30 MG/ML IJ SOLN
INTRAMUSCULAR | Status: AC
  Filled 2015-04-14: qty 1

## 2015-04-14 MED ORDER — KETOROLAC TROMETHAMINE 30 MG/ML IJ SOLN
30.0000 mg | Freq: Once | INTRAMUSCULAR | Status: AC
  Administered 2015-04-14: 30 mg via INTRAMUSCULAR

## 2015-04-14 MED ORDER — IBUPROFEN 800 MG PO TABS: 800.0000 mg | ORAL_TABLET | Freq: Three times a day (TID) | ORAL | Status: DC

## 2015-04-14 NOTE — ED Notes (Signed)
C/o back pain, low back pain, that started 8/27.  Pain radiates into both legs.  Denies numbness or tingling, denies urinary symptoms.  History of back problems, has been diagnosed with arthritis in back

## 2015-04-14 NOTE — ED Provider Notes (Signed)
CSN: [id number]     Arrival date & time 04/14/15  1839 History   First MD Initiated Contact with Patient 04/14/15 1851     Chief Complaint  Patient presents with  . Back Pain   (Consider location/radiation/quality/duration/timing/severity/associated sxs/prior Treatment) Patient is a 29 y.o. female presenting with back pain. The history is provided by the patient.  Back Pain Location:  Lumbar spine Quality:  Stabbing and cramping Radiates to:  Does not radiate Pain severity:  Mild Onset quality:  Sudden Duration:  1 day Progression:  Unchanged Chronicity:  Recurrent (similar 37mo ago, resolved,had xrays--ddd.) Relieved by:  None tried Worsened by:  Nothing tried Ineffective treatments:  None tried Associated symptoms: no abdominal pain, no abdominal swelling, no bladder incontinence, no bowel incontinence, no dysuria, no fever, no leg pain, no numbness, no paresthesias, no perianal numbness, no tingling and no weakness   Risk factors: lack of exercise     Past Medical History  Diagnosis Date  . Hypertension   . Pregnancy induced hypertension   . Shortness of breath     with bronchitis  . GERD (gastroesophageal reflux disease)     pregnancy related  . Anemia     1st pregnancy  . 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 10/05/2012  . Multiple gestation with malpresentation of one fetus or more 10/05/2012  . S/P cesarean section 10/06/2012  . Abnormal Pap smear   . Transient hypertension of pregnancy, with delivery 10/19/2012  . Headache    Past Surgical History  Procedure Laterality Date  . Wisdom tooth extraction    . Cesarean section N/A 10/06/2012    Procedure: CESAREAN SECTION;  Surgeon: Sherron Monday, MD;  Location: [facility];  Service: Obstetrics;  Laterality: N/A;  PRIMARY C/S-TWINS  . Bilateral salpingectomy Bilateral 10/06/2012    Procedure: BILATERAL SALPINGECTOMY;  Surgeon: Sherron Monday, MD;  Location: [facility];  Service: Gynecology;  Laterality: Bilateral;  .  Tubal ligation    . Carpal tunnel release Bilateral 04/12/2014    Procedure: RIGHT CARPAL TUNNEL RELEASE, LEFT CARPAL TUNNEL RELEASE;  Surgeon: Cindee Salt, MD;  Location: [facility];  Service: Orthopedics;  Laterality: Bilateral;   Family History  Problem Relation Age of Onset  . Hypertension Mother   . Asthma Mother   . Hypertension Father   . Cancer Father     esophageal   . Hypertension Maternal Grandmother   . Diabetes Maternal Grandmother   . Cancer Maternal Grandfather   . Cancer Paternal Grandmother   . Hypertension Paternal Grandmother    Social History  Substance Use Topics  . Smoking status: Former Smoker -- 0.15 packs/day for 3 years    Types: Cigarettes    Quit date: 07/18/2009  . Smokeless tobacco: Never Used  . Alcohol Use: 2.4 oz/week    2 Cans of beer, 2 Shots of liquor per week     Comment: drinks on weekends   OB History    Gravida Para Term Preterm AB TAB SAB Ectopic Multiple Living   2 2 2      1 3      Review of Systems  Constitutional: Negative.  Negative for fever.  Gastrointestinal: Negative.  Negative for abdominal pain and bowel incontinence.  Genitourinary: Negative.  Negative for bladder incontinence and dysuria.  Musculoskeletal: Positive for myalgias and back pain. Negative for joint swelling and gait problem.  Skin: Negative.   Neurological: Negative for tingling, weakness, numbness and paresthesias.    Allergies  Review of patient's allergies indicates no known allergies.  Home Medications   Prior to Admission medications   Medication Sig Start Date End Date Taking? Authorizing Provider  cyclobenzaprine (FLEXERIL) 5 MG tablet Take 1 tablet (5 mg total) by mouth 3 (three) times daily as needed for muscle spasms. 04/14/15   Linna Hoff, MD  ibuprofen (ADVIL,MOTRIN) 800 MG tablet Take 1 tablet (800 mg total) by mouth 3 (three) times daily. 04/14/15   Linna Hoff, MD  meloxicam [facility]) 15 MG tablet  12/13/14   Historical  Provider, MD  Multiple Vitamin (MULTIVITAMIN WITH MINERALS) TABS tablet Take 1 tablet by mouth daily.    Historical Provider, MD  predniSONE (STERAPRED UNI-PAK) 10 MG tablet TAKE AS DIRECTED PER INSTRUCTIONS ON PACKAGING 11/08/14   Historical Provider, MD  propranolol (INDERAL) 40 MG tablet Take 1 tablet (40 mg total) by mouth 2 (two) times daily. Patient not taking: Reported on 02/01/2015 01/09/15   Veryl Speak, FNP  rizatriptan (MAXALT-MLT) 10 MG disintegrating tablet Take 1 tablet (10 mg total) by mouth as needed for migraine. May repeat x1 in 2 hours if needed.  Do not exceed 2 tabs in 24hrs 02/01/15   Drema Dallas, DO  traMADol (ULTRAM) 50 MG tablet take 1 tablet by mouth every 8 hours if needed 11/06/14   Historical Provider, MD  venlafaxine XR (EFFEXOR-XR) 37.5 MG 24 hr capsule Take 1cap daily x7d, then 2caps daily 02/01/15   Drema Dallas, DO   Meds Ordered and Administered this Visit   Medications  ketorolac (TORADOL) 30 MG/ML injection 30 mg (30 mg Intramuscular Given 04/14/15 1918)    BP 145/94 mmHg  Pulse 83  Temp(Src) 98.6 F (37 C) (Oral)  Resp 16  SpO2 100%  LMP 03/14/2015 No data found.   Physical Exam  Constitutional: She is oriented to person, place, and time. She appears well-developed and well-nourished.  Abdominal: Soft. Bowel sounds are normal. There is no tenderness.  Musculoskeletal: She exhibits tenderness.       Lumbar back: She exhibits decreased range of motion, tenderness, pain and spasm. She exhibits no swelling, no edema and normal pulse.       Back:  Neurological: She is alert and oriented to person, place, and time.  Skin: Skin is warm and dry.  Nursing note and vitals reviewed.   ED Course  Procedures (including critical care time)  Labs Review Labs Reviewed - No data to display  Imaging Review No results found.   Visual Acuity Review  Right Eye Distance:   Left Eye Distance:   Bilateral Distance:    Right Eye Near:   Left Eye Near:     Bilateral Near:         MDM   1. Low back strain, initial encounter        Linna Hoff, MD 04/14/15 1924

## 2015-04-14 NOTE — Discharge Instructions (Signed)
Use heat and medicine as needed, see orthopedist if further problems. °

## 2015-05-21 ENCOUNTER — Ambulatory Visit (INDEPENDENT_AMBULATORY_CARE_PROVIDER_SITE_OTHER): Payer: BLUE CROSS/BLUE SHIELD | Admitting: Family

## 2015-05-21 ENCOUNTER — Encounter: Payer: Self-pay | Admitting: Family

## 2015-05-21 VITALS — BP 118/78 | HR 72 | Temp 98.2°F | Wt 183.0 lb

## 2015-05-21 DIAGNOSIS — G43109 Migraine with aura, not intractable, without status migrainosus: Secondary | ICD-10-CM | POA: Diagnosis not present

## 2015-05-21 MED ORDER — ACETAMINOPHEN-CODEINE #3 300-30 MG PO TABS: 1.0000 | ORAL_TABLET | Freq: Three times a day (TID) | ORAL | Status: DC | PRN

## 2015-05-21 NOTE — Patient Instructions (Signed)
Thank you for choosing [facility].  Summary/Instructions:  Follow up neurology for your migraines.  Take the Tylenol 3 as needed for headaches.   Your prescription(s) have been submitted to your pharmacy or been printed and provided for you. Please take as directed and contact our office if you believe you are having problem(s) with the medication(s) or have any questions.  If your symptoms worsen or fail to improve, please contact our office for further instruction, or in case of emergency go directly to the emergency room at the closest medical facility.

## 2015-05-21 NOTE — Progress Notes (Signed)
Subjective:    Patient ID: Rachel [name], female    DOB: May 05, 1986, 30 y.o.   MRN: [id number]  Chief Complaint  Patient presents with  . Migraine    HPI:  Rachel [name] is a 29 y.o. female who  has a past medical history of Hypertension; Pregnancy induced hypertension; Shortness of breath; GERD (gastroesophageal reflux disease); Anemia; 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 (10/05/2012); Multiple gestation with malpresentation of one fetus or more (10/05/2012); S/P cesarean section (10/06/2012); Abnormal Pap smear; Transient hypertension of pregnancy, with delivery (10/19/2012); and Headache. and presents today for a follow up office visit.   1.) Migraines - Previously prescribed propranolol and rizatriptan and is no longer taking the medication. Indicates the reason for discontinuation was lack of effectiveness. Continues to experience migraine headaches 2-3 times per week on average. Modifying factors include Aleve which does not really help with her symptoms. Ibuprofen does seem to help some for headaches to make them more manageable.  No Known Allergies   Current Outpatient Prescriptions on File Prior to Visit  Medication Sig Dispense Refill  . cyclobenzaprine (FLEXERIL) 5 MG tablet Take 1 tablet (5 mg total) by mouth 3 (three) times daily as needed for muscle spasms. 30 tablet 0  . ibuprofen (ADVIL,MOTRIN) 800 MG tablet Take 1 tablet (800 mg total) by mouth 3 (three) times daily. 21 tablet 0  . Multiple Vitamin (MULTIVITAMIN WITH MINERALS) TABS tablet Take 1 tablet by mouth daily.     No current facility-administered medications on file prior to visit.     Past Surgical History  Procedure Laterality Date  . Wisdom tooth extraction    . Cesarean section N/A 10/06/2012    Procedure: CESAREAN SECTION;  Surgeon: Sherron Monday, MD;  Location: [facility];  Service: Obstetrics;  Laterality: N/A;  PRIMARY C/S-TWINS  . Bilateral salpingectomy Bilateral 10/06/2012   Procedure: BILATERAL SALPINGECTOMY;  Surgeon: Sherron Monday, MD;  Location: [facility];  Service: Gynecology;  Laterality: Bilateral;  . Tubal ligation    . Carpal tunnel release Bilateral 04/12/2014    Procedure: RIGHT CARPAL TUNNEL RELEASE, LEFT CARPAL TUNNEL RELEASE;  Surgeon: Cindee Salt, MD;  Location: [facility];  Service: Orthopedics;  Laterality: Bilateral;    Review of Systems  Constitutional: Negative for fever and chills.  Gastrointestinal: Negative for nausea, vomiting and diarrhea.  Neurological: Positive for headaches.      Objective:    BP 118/78 mmHg  Pulse 72  Temp(Src) 98.2 F (36.8 C)  Wt 183 lb (83.008 kg)  SpO2 98% Nursing note and vital signs reviewed.  Physical Exam  Constitutional: She is oriented to person, place, and time. She appears well-developed and well-nourished. No distress.  Eyes: Conjunctivae and EOM are normal. Pupils are equal, round, and reactive to light.  Cardiovascular: Normal rate, regular rhythm, normal heart sounds and intact distal pulses.   Pulmonary/Chest: Effort normal and breath sounds normal.  Neurological: She is alert and oriented to person, place, and time. No cranial nerve deficit.  Skin: Skin is warm and dry.  Psychiatric: She has a normal mood and affect. Her behavior is normal. Judgment and thought content normal.       Assessment & Plan:   Problem List Items Addressed This Visit      Cardiovascular and Mediastinum   Migraine with aura and without status migrainosus, not intractable - Primary    Continues to experience migraine headaches and has self discontinued the rizatriptan and propranolol. Question compliance  with the daily medications as patient stated she does not take daily medications well. Start Tylenol 3 as needed for headaches. Follow up with neurology for further management.       Relevant Medications   acetaminophen-codeine (TYLENOL #3) 300-30 MG tablet

## 2015-05-21 NOTE — Assessment & Plan Note (Signed)
Continues to experience migraine headaches and has self discontinued the rizatriptan and propranolol. Question compliance with the daily medications as patient stated she does not take daily medications well. Start Tylenol 3 as needed for headaches. Follow up with neurology for further management.

## 2015-05-21 NOTE — Progress Notes (Signed)
Pre visit review using our clinic review tool, if applicable. No additional management support is needed unless otherwise documented below in the visit note. 

## 2015-05-22 DIAGNOSIS — Z0279 Encounter for issue of other medical certificate: Secondary | ICD-10-CM

## 2015-05-29 ENCOUNTER — Encounter: Payer: BLUE CROSS/BLUE SHIELD | Admitting: Family

## 2015-05-29 DIAGNOSIS — Z0289 Encounter for other administrative examinations: Secondary | ICD-10-CM

## 2015-05-30 ENCOUNTER — Ambulatory Visit (INDEPENDENT_AMBULATORY_CARE_PROVIDER_SITE_OTHER): Payer: BLUE CROSS/BLUE SHIELD | Admitting: Neurology

## 2015-05-30 ENCOUNTER — Encounter: Payer: Self-pay | Admitting: Neurology

## 2015-05-30 VITALS — BP 110/80 | HR 66 | Ht 67.0 in | Wt 181.1 lb

## 2015-05-30 DIAGNOSIS — G8929 Other chronic pain: Secondary | ICD-10-CM

## 2015-05-30 DIAGNOSIS — R51 Headache: Secondary | ICD-10-CM

## 2015-05-30 MED ORDER — TOPIRAMATE 25 MG PO TABS: ORAL_TABLET | ORAL | Status: DC

## 2015-05-30 MED ORDER — NAPROXEN SODIUM 550 MG PO TABS: 550.0000 mg | ORAL_TABLET | Freq: Two times a day (BID) | ORAL | Status: DC | PRN

## 2015-05-30 NOTE — Progress Notes (Signed)
NEUROLOGY FOLLOW UP OFFICE NOTE  Rachel [name] [id number]  HISTORY OF PRESENT ILLNESS: Rachel [name] is a 29 year old right-handed woman with hypertension who follows up for frequent migraines.  UPDATE: She stopped venlafaxine 2 months ago because it caused suppressed appetite.  Headaches unchanged.  Ibuprofen  takes edge off. Stress and poor sleep still a problem.  HISTORY: Onset:  For a few years (at least 2013) but worse over past 2 months Location:  Bi-frontal (left worse than right) Quality:  squeezing Initial Intensity:  8/10, sometimes 10/10 Aura:  Black spots in her vision Prodrome:  no Associated symptoms:  Photophobia, phonophobia, feels off-balance, once had nausea Initial Duration:  2 hours to all day Initial Frequency:  2-3 times a week Triggers/exacerbating factors:  none Relieving factors:  none Activity:  Able to function but called out 4 times from work in last 2 months  Past abortive therapy:  sumatriptan  (ineffective), Maxalt  (ineffective), Excedrin migraine, Fioricet.  Could not afford Relpax. Past preventative therapy:  propranolol  twice daily (took only for 2 weeks because it was ineffective), venlafaxine (suppressed appetite)  CT of head performed in 2013 for headache was normal.  Family history of headache:  No family history.  No family history of aneurysms.  PAST MEDICAL HISTORY: Past Medical History  Diagnosis Date  . Hypertension   . Pregnancy induced hypertension   . Shortness of breath     with bronchitis  . GERD (gastroesophageal reflux disease)     pregnancy related  . Anemia     1st pregnancy  . 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 10/05/2012  . Multiple gestation with malpresentation of one fetus or more 10/05/2012  . S/P cesarean section 10/06/2012  . Abnormal Pap smear   . Transient hypertension of pregnancy, with delivery 10/19/2012  . Headache     MEDICATIONS: Current Outpatient  Prescriptions on File Prior to Visit  Medication Sig Dispense Refill  . acetaminophen-codeine (TYLENOL #3) 300-30 MG tablet Take 1 tablet by mouth every 8 (eight) hours as needed for moderate pain. 40 tablet 0  . Multiple Vitamin (MULTIVITAMIN WITH MINERALS) TABS tablet Take 1 tablet by mouth daily.     No current facility-administered medications on file prior to visit.    ALLERGIES: No Known Allergies  FAMILY HISTORY: Family History  Problem Relation Age of Onset  . Hypertension Mother   . Asthma Mother   . Hypertension Father   . Cancer Father     esophageal   . Hypertension Maternal Grandmother   . Diabetes Maternal Grandmother   . Cancer Maternal Grandfather   . Cancer Paternal Grandmother   . Hypertension Paternal Grandmother     SOCIAL HISTORY: Social History   Social History  . Marital Status: Single    Spouse Name: N/A  . Number of Children: 3  . Years of Education: 14   Occupational History  . Production operator    Social History Main Topics  . Smoking status: Former Smoker -- 0.15 packs/day for 3 years    Types: Cigarettes    Quit date: 07/18/2009  . Smokeless tobacco: Never Used  . Alcohol Use: 2.4 oz/week    2 Cans of beer, 2 Shots of liquor per week     Comment: drinks on weekends  . Drug Use: No  . Sexual Activity:    Partners: Male    Birth Control/ Protection: Surgical   Other Topics Concern  . Not on file  Social History Narrative   Born in [place] and raised in [place]. Currently resides in a townhouse 3 children and boyfriend. No pets. Fun: ride a motorcycle.   Denies religious beliefs to effect health care.     REVIEW OF SYSTEMS: Constitutional: No fevers, chills, or sweats, no generalized fatigue, change in appetite Eyes: No visual changes, double vision, eye pain Ear, nose and throat: No hearing loss, ear pain, nasal congestion, sore throat Cardiovascular: No chest pain, palpitations Respiratory:  No shortness of breath at  rest or with exertion, wheezes GastrointestinaI: No nausea, vomiting, diarrhea, abdominal pain, fecal incontinence Genitourinary:  No dysuria, urinary retention or frequency Musculoskeletal:  No neck pain, back pain Integumentary: No rash, pruritus, skin lesions Neurological: as above Psychiatric: No depression, insomnia, anxiety Endocrine: No palpitations, fatigue, diaphoresis, mood swings, change in appetite, change in weight, increased thirst Hematologic/Lymphatic:  No anemia, purpura, petechiae. Allergic/Immunologic: no itchy/runny eyes, nasal congestion, recent allergic reactions, rashes  PHYSICAL EXAM: Filed Vitals:   05/30/15 0939  BP: 110/80  Pulse: 66   General: No acute distress.  Patient appears well-groomed.  Head:  Normocephalic/atraumatic Eyes:  Fundoscopic exam unremarkable without vessel changes, exudates, hemorrhages or papilledema. Neck: supple, no paraspinal tenderness, full range of motion Heart:  Regular rate and rhythm Lungs:  Clear to auscultation bilaterally Back: No paraspinal tenderness Neurological Exam: alert and oriented to person, place, and time. Attention span and concentration intact, recent and remote memory intact, fund of knowledge intact.  Speech fluent and not dysarthric, language intact.  CN II-XII intact. Fundoscopic exam unremarkable without vessel changes, exudates, hemorrhages or papilledema.  Bulk and tone normal, muscle strength 5/5 throughout.  Sensation to light touch intact.  Deep tendon reflexes 2+ throughout.  Finger to nose and heel to shin testing intact.  Gait normal.  IMPRESSION: Either chronic migraine vs tension-type headache  PLAN: 1.  Start topiramate, titrating to 50mg  at bedtime 2.  Naproxen 550mg  as abortive therapy 3.  Sleep hygiene 4.  She is to call in 4 weeks and follow up in 3 months.  15 minutes spent face to face with patient, over 50% spent discussing management.  Rachel MilletAdam Britney Newstrom, DO  CC:  Rachel LuzGregory  [name]

## 2015-05-30 NOTE — Patient Instructions (Signed)
Migraine Recommendations: 1.  Start topiramate 25mg  tablet.  Take 1 tablet at bedtime for 7 days, then 2 tablets at bedtime.  Call in 4 weeks with update and we can adjust dose if needed.  Possible side effects include: impaired thinking, sedation, paresthesias (numbness and tingling) and weight loss.  It may cause dehydration and there is a small risk for kidney stones, so make sure to stay hydrated with water during the day.  There is also a very small risk for glaucoma, so if you notice any change in your vision while taking this medication, see an ophthalmologist.  There is also a very small risk of possible suicidal ideation, as it the case with all antiepileptic medications.  2.  Take naproxen 550mg  at earliest onset of headache.  May repeat dose once in 12 hours if needed.  3.  Limit use of pain relievers to no more than 2 days out of the week.  These medications include acetaminophen, ibuprofen, triptans and narcotics.  This will help reduce risk of rebound headaches. 4.  Be aware of common food triggers such as processed sweets, processed foods with nitrites (such as deli meat, hot dogs, sausages), foods with MSG, alcohol (such as wine), chocolate, certain cheeses, certain fruits (dried fruits, some citrus fruit), vinegar, diet soda. 4.  Avoid caffeine 5.  Routine exercise 6.  Proper sleep hygiene 7.  Stay adequately hydrated with water 8.  Keep a headache diary. 9.  Maintain proper stress management. 10.  Do not skip meals. 11.  Consider supplements:  Magnesium oxide 400mg  to 600mg  daily, riboflavin 400mg , Coenzyme Q 10 100mg  three times daily 12.  Follow up in 3 months.

## 2015-06-10 ENCOUNTER — Emergency Department (HOSPITAL_COMMUNITY)
Admission: EM | Admit: 2015-06-10 | Discharge: 2015-06-11 | Disposition: A | Discharge status: Home / Self Care | Payer: BLUE CROSS/BLUE SHIELD | Attending: Emergency Medicine | Admitting: Emergency Medicine

## 2015-06-10 ENCOUNTER — Telehealth: Payer: Self-pay | Admitting: Family

## 2015-06-10 ENCOUNTER — Encounter (HOSPITAL_COMMUNITY): Payer: Self-pay | Admitting: Emergency Medicine

## 2015-06-10 DIAGNOSIS — I10 Essential (primary) hypertension: Secondary | ICD-10-CM | POA: Diagnosis not present

## 2015-06-10 DIAGNOSIS — Z79899 Other long term (current) drug therapy: Secondary | ICD-10-CM | POA: Insufficient documentation

## 2015-06-10 DIAGNOSIS — R51 Headache: Secondary | ICD-10-CM | POA: Diagnosis present

## 2015-06-10 DIAGNOSIS — J020 Streptococcal pharyngitis: Secondary | ICD-10-CM | POA: Diagnosis not present

## 2015-06-10 DIAGNOSIS — Z8719 Personal history of other diseases of the digestive system: Secondary | ICD-10-CM | POA: Diagnosis not present

## 2015-06-10 DIAGNOSIS — Z862 Personal history of diseases of the blood and blood-forming organs and certain disorders involving the immune mechanism: Secondary | ICD-10-CM | POA: Diagnosis not present

## 2015-06-10 DIAGNOSIS — Z87891 Personal history of nicotine dependence: Secondary | ICD-10-CM | POA: Insufficient documentation

## 2015-06-10 HISTORY — DX: Personal history of other diseases of the nervous system and sense organs: Z86.69

## 2015-06-10 LAB — RAPID STREP SCREEN (MED CTR MEBANE ONLY): STREPTOCOCCUS, GROUP A SCREEN (DIRECT): POSITIVE — AB

## 2015-06-10 MED ORDER — NAPROXEN 500 MG PO TABS
500.0000 mg | ORAL_TABLET | Freq: Once | ORAL | Status: AC
  Administered 2015-06-10: 500 mg via ORAL
  Filled 2015-06-10: qty 1

## 2015-06-10 MED ORDER — HYDROCODONE-ACETAMINOPHEN 7.5-325 MG/15ML PO SOLN: 15.0000 mL | Freq: Three times a day (TID) | ORAL | Status: DC | PRN

## 2015-06-10 MED ORDER — HYDROCODONE-ACETAMINOPHEN 7.5-325 MG/15ML PO SOLN
10.0000 mL | Freq: Once | ORAL | Status: AC
  Administered 2015-06-10: 10 mL via ORAL
  Filled 2015-06-10: qty 15

## 2015-06-10 MED ORDER — DEXAMETHASONE SODIUM PHOSPHATE 10 MG/ML IJ SOLN
10.0000 mg | Freq: Once | INTRAMUSCULAR | Status: AC
  Administered 2015-06-10: 10 mg via INTRAMUSCULAR
  Filled 2015-06-10: qty 1

## 2015-06-10 MED ORDER — AMOXICILLIN 500 MG PO CAPS: 500.0000 mg | ORAL_CAPSULE | Freq: Two times a day (BID) | ORAL | Status: DC

## 2015-06-10 NOTE — Telephone Encounter (Signed)
It is unlikely that this is related to the flu shot, does she have any signs of infection at the site where she received the shot? Otherwise I would recommend a follow up office visit for further assessment.

## 2015-06-10 NOTE — Telephone Encounter (Signed)
[facility] - Client TELEPHONE ADVICE RECORD [facility] Patient Name: Rachel [name] DOB: 03-22-1986 Initial Comment caller states she recv'd a flu shot 2 wks ago - has felt horrible since - is having right ear draining and pain and a sore throat and a headache Nurse Assessment Nurse: Izola PriceMyers, RN, Cala BradfordKimberly Date/Time (Eastern Time): 06/10/2015 12:49:33 PM Confirm and document reason for call. If symptomatic, describe symptoms. ---caller states she recv'd a flu shot 2 wks ago - has felt horrible since - is having right ear pain and a sore throat and a headache. not eating well. right ear. migraines. takes naproxen for these. Got flu shot at work. Coworker also sick. headache on right side. ear feels clogged. no fever. cough Has the patient traveled out of the country within the last 30 days? ---No Does the patient have any new or worsening symptoms? ---Yes Will a triage be completed? ---Yes Related visit to physician within the last 2 weeks? ---No Does the PT have any chronic conditions? (i.e. diabetes, asthma, etc.) ---Yes List chronic conditions. ---chronic bronchitis Did the patient indicate they were pregnant? ---No Guidelines Guideline Title Affirmed Question Affirmed Notes Sore Throat SEVERE (e.g., excruciating) throat pain Final Disposition User See Physician within 24 Hours Izola PriceMyers, RN, Ray[employer]Kimberly Referrals GO TO FACILITY OTHER - SPECIFY Disagree/Comply: Comply

## 2015-06-10 NOTE — ED Notes (Signed)
Pt states that she received a flu shot on 05/30/15 and since then has not been able to eat, has been tired, has been cold, and a headache on the right side of her head. (Hx of migraines)

## 2015-06-10 NOTE — ED Provider Notes (Signed)
CSN: [id number]     Arrival date & time 06/10/15  2114 History  By signing my name below, I, Sonum Patel, attest that this documentation has been prepared under the direction and in the presence of TRW AutomotiveKelly Martin Smeal, PA-C. Electronically Signed: Sonum Patel, Neurosurgeoncribe. 06/10/2015. 10:43 PM.    Chief Complaint  Patient presents with  . Headache   The history is provided by the patient. No language interpreter was used.     HPI Comments: Rachel [name] is a 29 y.o. female who presents to the Emergency Department complaining of a a constant, gradually worsened sore throat with associated bilateral otalgia, right sided HA, and fatigue for the past few days. She took a Topamax for her HA with mild relief along with Mucinex and other OTC medications without relief. She states she had a flu vaccine 10 days ago and is unsure if this is related to that. She denies sick contacts at home. She denies fever, myalgias, inability to swallow, drooling, shortness of breath, vomiting.   Past Medical History  Diagnosis Date  . Hypertension   . Pregnancy induced hypertension   . Shortness of breath     with bronchitis  . GERD (gastroesophageal reflux disease)     pregnancy related  . Anemia     1st pregnancy  . 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 10/05/2012  . Multiple gestation with malpresentation of one fetus or more 10/05/2012  . S/P cesarean section 10/06/2012  . Abnormal Pap smear   . Transient hypertension of pregnancy, with delivery 10/19/2012  . Headache   . Hx of migraines    Past Surgical History  Procedure Laterality Date  . Wisdom tooth extraction    . Cesarean section N/A 10/06/2012    Procedure: CESAREAN SECTION;  Surgeon: Sherron MondayJody Bovard, MD;  Location: [facility];  Service: Obstetrics;  Laterality: N/A;  PRIMARY C/S-TWINS  . Bilateral salpingectomy Bilateral 10/06/2012    Procedure: BILATERAL SALPINGECTOMY;  Surgeon: Sherron MondayJody Bovard, MD;  Location: [facility];  Service: Gynecology;   Laterality: Bilateral;  . Tubal ligation    . Carpal tunnel release Bilateral 04/12/2014    Procedure: RIGHT CARPAL TUNNEL RELEASE, LEFT CARPAL TUNNEL RELEASE;  Surgeon: Cindee SaltGary Kuzma, MD;  Location: [facility];  Service: Orthopedics;  Laterality: Bilateral;   Family History  Problem Relation Age of Onset  . Hypertension Mother   . Asthma Mother   . Hypertension Father   . Cancer Father     esophageal   . Hypertension Maternal Grandmother   . Diabetes Maternal Grandmother   . Cancer Maternal Grandfather   . Cancer Paternal Grandmother   . Hypertension Paternal Grandmother    Social History  Substance Use Topics  . Smoking status: Former Smoker -- 0.15 packs/day for 3 years    Types: Cigarettes    Quit date: 07/18/2009  . Smokeless tobacco: Never Used  . Alcohol Use: 2.4 oz/week    2 Cans of beer, 2 Shots of liquor per week     Comment: drinks on weekends   OB History    Gravida Para Term Preterm AB TAB SAB Ectopic Multiple Living   2 2 2      1 3       Review of Systems  Constitutional: Positive for fatigue. Negative for fever.  HENT: Positive for ear pain and sore throat.   Musculoskeletal: Negative for myalgias.  Neurological: Positive for headaches.  All other systems reviewed and are negative.   Allergies  Review of patient's allergies indicates no known allergies.  Home Medications   Prior to Admission medications   Medication Sig Start Date End Date Taking? Authorizing Provider  acetaminophen-codeine (TYLENOL #3) 300-30 MG tablet Take 1 tablet by mouth every 8 (eight) hours as needed for moderate pain. 05/21/15  Yes Veryl Speak, FNP  naproxen sodium (ANAPROX) 550 MG tablet Take 1 tablet (550 mg total) by mouth 2 (two) times daily as needed. Patient taking differently: Take 550 mg by mouth 2 (two) times daily as needed for mild pain.  05/30/15  Yes Adam Mliss Fritz, DO  topiramate (TOPAMAX) 25 MG tablet Take 1tab at bedtime for 7 days then 2tabs at  bedtime Patient taking differently: Take 50 mg by mouth daily.  05/30/15  Yes Adam Mliss Fritz, DO  amoxicillin (AMOXIL) 500 MG capsule Take 1 capsule (500 mg total) by mouth 2 (two) times daily. 06/10/15   Antony Madura, PA-C  HYDROcodone-acetaminophen (HYCET) 7.5-325 mg/15 ml solution Take 15 mLs by mouth every 8 (eight) hours as needed for moderate pain. 06/10/15   Antony Madura, PA-C   BP 131/86 mmHg  Pulse 81  Resp 16  SpO2 98%  LMP 05/23/2015   Physical Exam  Constitutional: She is oriented to person, place, and time. She appears well-developed and well-nourished. No distress.  Nontoxic/nonseptic appearing  HENT:  Head: Normocephalic and atraumatic.  Right Ear: Tympanic membrane, external ear and ear canal normal.  Left Ear: Tympanic membrane, external ear and ear canal normal.  Mouth/Throat: Uvula is midline and mucous membranes are normal. Oropharyngeal exudate, posterior oropharyngeal edema and posterior oropharyngeal erythema present. No tonsillar abscesses.  Patient tolerating secretions without difficulty. No stridor.  Eyes: Conjunctivae and EOM are normal. No scleral icterus.  Neck: Normal range of motion.  Cardiovascular: Normal rate, regular rhythm and intact distal pulses.   Pulmonary/Chest: Effort normal. No respiratory distress. She has no wheezes. She has no rales.  Respirations even and unlabored. Lungs clear.  Musculoskeletal: Normal range of motion.  Lymphadenopathy:    She has cervical adenopathy.  Neurological: She is alert and oriented to person, place, and time. She exhibits normal muscle tone. Coordination normal.  Skin: Skin is warm and dry. No rash noted. She is not diaphoretic. No erythema. No pallor.  Psychiatric: She has a normal mood and affect. Her behavior is normal.  Nursing note and vitals reviewed.   ED Course  Procedures (including critical care time)  DIAGNOSTIC STUDIES: Oxygen Saturation is 98% on RA, normal by my interpretation.     COORDINATION OF CARE: 10:51 PM Discussed treatment plan with pt at bedside and pt agreed to plan.   Labs Review Labs Reviewed  RAPID STREP SCREEN (NOT AT [facility]) - Abnormal; Notable for the following:    Streptococcus, Group A Screen (Direct) POSITIVE (*)    All other components within normal limits    Imaging Review No results found.   I have personally reviewed and evaluated these lab results as part of my medical decision-making.   EKG Interpretation None      MDM   Final diagnoses:  Strep pharyngitis    Pt afebrile with tonsillar exudate, cervical lymphadenopathy, and dysphagia; diagnosis of strep. Treated in the Ed with steroids, NSAIDs, and Hycet. Will d/c with Amoxicillin. Presentation not concerning for PTA or infxn spread to soft tissue. No trismus or uvula deviation. Specific return precautions discussed. Pt able to drink water in ED without difficulty with intact air way. Recommended PCP follow up. Return  precautions given at discharge. Patient discharged in good condition with no unaddressed concerns.  I personally performed the services described in this documentation, which was scribed in my presence. The recorded information has been reviewed and is accurate.    Filed Vitals:   06/10/15 2207  BP: 131/86  Pulse: 81  Resp: 16  SpO2: 98%     Antony Madura, PA-C 06/11/15 0019  Bethann Berkshire, MD 06/13/15 [phone]

## 2015-06-10 NOTE — Discharge Instructions (Signed)

## 2015-06-11 NOTE — Telephone Encounter (Signed)
Patient went to ER last night.  She was given antibiotics.  Did inform to call us if her condition worsens.

## 2015-07-01 ENCOUNTER — Telehealth: Payer: Self-pay | Admitting: Neurology

## 2015-07-01 MED ORDER — NAPROXEN SODIUM 550 MG PO TABS: 550.0000 mg | ORAL_TABLET | Freq: Two times a day (BID) | ORAL | Status: DC | PRN

## 2015-07-01 MED ORDER — TOPIRAMATE 25 MG PO TABS: 50.0000 mg | ORAL_TABLET | Freq: Every day | ORAL | Status: DC

## 2015-07-01 NOTE — Telephone Encounter (Signed)
PT called in regards to her medication Topamax/Dawn [phone]

## 2015-07-01 NOTE — Telephone Encounter (Signed)
Needs refill. Last OV: 05/30/15

## 2015-07-02 ENCOUNTER — Telehealth: Payer: Self-pay | Admitting: Family

## 2015-07-02 DIAGNOSIS — G43109 Migraine with aura, not intractable, without status migrainosus: Secondary | ICD-10-CM

## 2015-07-02 NOTE — Telephone Encounter (Signed)
Patient requesting a refill of acetaminophen-codeine (TYLENOL #3) 300-30 MG tablet [[id number]][[id number]] sent to [facility] on [address]

## 2015-07-02 NOTE — Telephone Encounter (Signed)
Please advise. Last refill was 10/4

## 2015-07-03 MED ORDER — ACETAMINOPHEN-CODEINE #3 300-30 MG PO TABS: 1.0000 | ORAL_TABLET | Freq: Three times a day (TID) | ORAL | Status: DC | PRN

## 2015-07-03 NOTE — Telephone Encounter (Signed)
Medication printed

## 2015-07-03 NOTE — Addendum Note (Signed)
Addended by: Jeanine LuzALONE, Chavy Avera D on: 07/03/2015 01:10 PM   Modules accepted: Orders

## 2015-07-03 NOTE — Telephone Encounter (Signed)
Rx printed

## 2015-07-16 ENCOUNTER — Ambulatory Visit (INDEPENDENT_AMBULATORY_CARE_PROVIDER_SITE_OTHER): Payer: BLUE CROSS/BLUE SHIELD

## 2015-07-16 ENCOUNTER — Telehealth: Payer: Self-pay

## 2015-07-16 DIAGNOSIS — R519 Headache, unspecified: Secondary | ICD-10-CM

## 2015-07-16 DIAGNOSIS — R51 Headache: Secondary | ICD-10-CM | POA: Diagnosis not present

## 2015-07-16 MED ORDER — METOCLOPRAMIDE HCL 5 MG/ML IJ SOLN: 10.0000 mg | Freq: Once | INTRAVENOUS | Status: DC

## 2015-07-16 MED ORDER — METOCLOPRAMIDE HCL 5 MG/ML IJ SOLN
10.0000 mg | Freq: Once | INTRAVENOUS | Status: AC
  Administered 2015-07-16: 10 mg via INTRAMUSCULAR

## 2015-07-16 MED ORDER — DIPHENHYDRAMINE HCL 50 MG/ML IJ SOLN
25.0000 mg | Freq: Once | INTRAMUSCULAR | Status: AC
  Administered 2015-07-16: 25 mg via INTRAVENOUS

## 2015-07-16 MED ORDER — KETOROLAC TROMETHAMINE 60 MG/2ML IM SOLN
60.0000 mg | Freq: Once | INTRAMUSCULAR | Status: AC
  Administered 2015-07-16: 60 mg via INTRAMUSCULAR

## 2015-07-16 MED ORDER — TOPIRAMATE 50 MG PO TABS: 50.0000 mg | ORAL_TABLET | Freq: Two times a day (BID) | ORAL | Status: DC

## 2015-07-16 NOTE — Telephone Encounter (Signed)
If she has a driver, she can come in for a headache cocktail (Toradol 60mg Benito Mccreedy/Benadryl 25mg /Reglan 10mg ).  If she doesn't have a driver, she can come in for Toradol 60mg  injection.  We can continue titrating the Topamax to 25mg  in AM and 50mg  at bedtime for 7 days, then 50mg  twice daily.  Please remind her that she should take folic acid 5mg  daily with this medication and that she should not get pregnant while taking Topamax.

## 2015-07-16 NOTE — Progress Notes (Signed)
Pt came in for headache cocktail. Pt had mother to drive her home. Tolerated injection well.

## 2015-07-16 NOTE — Telephone Encounter (Signed)
Message relayed to patient. Verbalized understanding and denied questions. Pt is coming in for headache cocktail. On her way now. Does have driver. Reiterated the need for folic acid on medication and birth control.

## 2015-07-16 NOTE — Telephone Encounter (Signed)
Pt called with complaints of a headache x 2 days. She had to leave work early yesterday due to pain level. Current pain level 7/10. Pain is on L side of head. Patient has been compliant with medications and is taking her Topamax 25 mg BID, and using her naproxen PRN for pain (is limiting use). (pt states Topamax seemed to be working better when she first started medication vs now) Wonders if h/a is due to no caffine. Pt states that since starting the Topamax her taste has changed and she no longer is consuming soda, however, she did have some over the holiday and that seemed to relieve some symptoms. Please advise.

## 2015-08-06 ENCOUNTER — Other Ambulatory Visit (INDEPENDENT_AMBULATORY_CARE_PROVIDER_SITE_OTHER): Payer: BLUE CROSS/BLUE SHIELD

## 2015-08-06 ENCOUNTER — Encounter: Payer: Self-pay | Admitting: Family

## 2015-08-06 ENCOUNTER — Ambulatory Visit (INDEPENDENT_AMBULATORY_CARE_PROVIDER_SITE_OTHER): Payer: BLUE CROSS/BLUE SHIELD | Admitting: Family

## 2015-08-06 ENCOUNTER — Telehealth: Payer: Self-pay | Admitting: Family

## 2015-08-06 VITALS — BP 108/70 | HR 67 | Temp 97.9°F | Resp 18 | Ht 67.0 in | Wt 179.0 lb

## 2015-08-06 DIAGNOSIS — Z23 Encounter for immunization: Secondary | ICD-10-CM

## 2015-08-06 DIAGNOSIS — Z0000 Encounter for general adult medical examination without abnormal findings: Secondary | ICD-10-CM | POA: Diagnosis not present

## 2015-08-06 LAB — LIPID PANEL
CHOL/HDL RATIO: 3
Cholesterol: 127 mg/dL (ref 0–200)
HDL: 41.4 mg/dL (ref >39.00)
LDL CALC: 75 mg/dL (ref 0–99)
NONHDL: 86.06
Triglycerides: 53 mg/dL (ref 0.0–149.0)
VLDL: 10.6 mg/dL (ref 0.0–40.0)

## 2015-08-06 LAB — COMPREHENSIVE METABOLIC PANEL
ALT: 8 U/L (ref 0–35)
AST: 12 U/L (ref 0–37)
Albumin: 4.2 g/dL (ref 3.5–5.2)
Alkaline Phosphatase: 52 U/L (ref 39–117)
BILIRUBIN TOTAL: 0.2 mg/dL (ref 0.2–1.2)
BUN: 11 mg/dL (ref 6–23)
CHLORIDE: 106 meq/L (ref 96–112)
CO2: 27 meq/L (ref 19–32)
CREATININE: 0.86 mg/dL (ref 0.40–1.20)
Calcium: 9.2 mg/dL (ref 8.4–10.5)
GFR: 100.68 mL/min (ref >60.00)
GLUCOSE: 82 mg/dL (ref 70–99)
Potassium: 4.1 mEq/L (ref 3.5–5.1)
SODIUM: 138 meq/L (ref 135–145)
Total Protein: 7.2 g/dL (ref 6.0–8.3)

## 2015-08-06 LAB — CBC
HCT: 39.7 % (ref 36.0–46.0)
Hemoglobin: 13 g/dL (ref 12.0–15.0)
MCHC: 32.9 g/dL (ref 30.0–36.0)
MCV: 85 fl (ref 78.0–100.0)
PLATELETS: 208 10*3/uL (ref 150.0–400.0)
RBC: 4.67 Mil/uL (ref 3.87–5.11)
RDW: 13.8 % (ref 11.5–15.5)
WBC: 7.2 10*3/uL (ref 4.0–10.5)

## 2015-08-06 LAB — TSH: TSH: 1.84 u[IU]/mL (ref 0.35–4.50)

## 2015-08-06 NOTE — Telephone Encounter (Signed)
Please inform patient that his blood work shows that her kidney function, liver function, electrolytes, thyroid function, white/red blood cells and cholesterol are within the normal limits. Therefore no further action is required at this time and she can plan to follow up in 1 year.

## 2015-08-06 NOTE — Addendum Note (Signed)
Addended by: Mercer PodWRENN, Nick Armel E on: 08/06/2015 01:22 PM   Modules accepted: Orders

## 2015-08-06 NOTE — Patient Instructions (Signed)
Thank you for choosing [facility].  Summary/Instructions:  Please stop by the lab on the basement level of the building for your blood work. Your results will be released to MyChart (or called to you) after review, usually within 72 hours after test completion. If any changes need to be made, you will be notified at that same time.  If your symptoms worsen or fail to improve, please contact our office for further instruction, or in case of emergency go directly to the emergency room at the closest medical facility.   Health Maintenance, Female Adopting a healthy lifestyle and getting preventive care can go a long way to promote health and wellness. Talk with your health care provider about what schedule of regular examinations is right for you. This is a good chance for you to check in with your provider about disease prevention and staying healthy. In between checkups, there are plenty of things you can do on your own. Experts have done a lot of research about which lifestyle changes and preventive measures are most likely to keep you healthy. Ask your health care provider for more information. WEIGHT AND DIET  Eat a healthy diet  Be sure to include plenty of vegetables, fruits, low-fat dairy products, and lean protein.  Do not eat a lot of foods high in solid fats, added sugars, or salt.  Get regular exercise. This is one of the most important things you can do for your health.  Most adults should exercise for at least 150 minutes each week. The exercise should increase your heart rate and make you sweat (moderate-intensity exercise).  Most adults should also do strengthening exercises at least twice a week. This is in addition to the moderate-intensity exercise.  Maintain a healthy weight  Body mass index (BMI) is a measurement that can be used to identify possible weight problems. It estimates body fat based on height and weight. Your health care provider can help determine your  BMI and help you achieve or maintain a healthy weight.  For females 20 years of age and older:   A BMI below 18.5 is considered underweight.  A BMI of 18.5 to 24.9 is normal.  A BMI of 25 to 29.9 is considered overweight.  A BMI of 30 and above is considered obese.  Watch levels of cholesterol and blood lipids  You should start having your blood tested for lipids and cholesterol at 28 years of age, then have this test every 5 years.  You may need to have your cholesterol levels checked more often if:  Your lipid or cholesterol levels are high.  You are older than 28 years of age.  You are at high risk for heart disease.  CANCER SCREENING   Lung Cancer  Lung cancer screening is recommended for adults 55-80 years old who are at high risk for lung cancer because of a history of smoking.  A yearly low-dose CT scan of the lungs is recommended for people who:  Currently smoke.  Have quit within the past 15 years.  Have at least a 30-pack-year history of smoking. A pack year is smoking an average of one pack of cigarettes a day for 1 year.  Yearly screening should continue until it has been 15 years since you quit.  Yearly screening should stop if you develop a health problem that would prevent you from having lung cancer treatment.  Breast Cancer  Practice breast self-awareness. This means understanding how your breasts normally appear and feel.  It   also means doing regular breast self-exams. Let your health care provider know about any changes, no matter how small.  If you are in your 20s or 30s, you should have a clinical breast exam (CBE) by a health care provider every 1-3 years as part of a regular health exam.  If you are 40 or older, have a CBE every year. Also consider having a breast X-ray (mammogram) every year.  If you have a family history of breast cancer, talk to your health care provider about genetic screening.  If you are at high risk for breast  cancer, talk to your health care provider about having an MRI and a mammogram every year.  Breast cancer gene (BRCA) assessment is recommended for women who have family members with BRCA-related cancers. BRCA-related cancers include:  Breast.  Ovarian.  Tubal.  Peritoneal cancers.  Results of the assessment will determine the need for genetic counseling and BRCA1 and BRCA2 testing. Cervical Cancer Your health care provider may recommend that you be screened regularly for cancer of the pelvic organs (ovaries, uterus, and vagina). This screening involves a pelvic examination, including checking for microscopic changes to the surface of your cervix (Pap test). You may be encouraged to have this screening done every 3 years, beginning at age 21.  For women ages 30-65, health care providers may recommend pelvic exams and Pap testing every 3 years, or they may recommend the Pap and pelvic exam, combined with testing for human papilloma virus (HPV), every 5 years. Some types of HPV increase your risk of cervical cancer. Testing for HPV may also be done on women of any age with unclear Pap test results.  Other health care providers may not recommend any screening for nonpregnant women who are considered low risk for pelvic cancer and who do not have symptoms. Ask your health care provider if a screening pelvic exam is right for you.  If you have had past treatment for cervical cancer or a condition that could lead to cancer, you need Pap tests and screening for cancer for at least 20 years after your treatment. If Pap tests have been discontinued, your risk factors (such as having a new sexual partner) need to be reassessed to determine if screening should resume. Some women have medical problems that increase the chance of getting cervical cancer. In these cases, your health care provider may recommend more frequent screening and Pap tests. Colorectal Cancer  This type of cancer can be detected and  often prevented.  Routine colorectal cancer screening usually begins at 28 years of age and continues through 28 years of age.  Your health care provider may recommend screening at an earlier age if you have risk factors for colon cancer.  Your health care provider may also recommend using home test kits to check for hidden blood in the stool.  A small camera at the end of a tube can be used to examine your colon directly (sigmoidoscopy or colonoscopy). This is done to check for the earliest forms of colorectal cancer.  Routine screening usually begins at age 50.  Direct examination of the colon should be repeated every 5-10 years through 28 years of age. However, you may need to be screened more often if early forms of precancerous polyps or small growths are found. Skin Cancer  Check your skin from head to toe regularly.  Tell your health care provider about any new moles or changes in moles, especially if there is a change in a mole's   shape or color.  Also tell your health care provider if you have a mole that is larger than the size of a pencil eraser.  Always use sunscreen. Apply sunscreen liberally and repeatedly throughout the day.  Protect yourself by wearing long sleeves, pants, a wide-brimmed hat, and sunglasses whenever you are outside. HEART DISEASE, DIABETES, AND HIGH BLOOD PRESSURE   High blood pressure causes heart disease and increases the risk of stroke. High blood pressure is more likely to develop in:  People who have blood pressure in the high end of the normal range (130-139/85-89 mm Hg).  People who are overweight or obese.  People who are African American.  If you are 18-39 years of age, have your blood pressure checked every 3-5 years. If you are 40 years of age or older, have your blood pressure checked every year. You should have your blood pressure measured twice--once when you are at a hospital or clinic, and once when you are not at a hospital or clinic.  Record the average of the two measurements. To check your blood pressure when you are not at a hospital or clinic, you can use:  An automated blood pressure machine at a pharmacy.  A home blood pressure monitor.  If you are between 55 years and 79 years old, ask your health care provider if you should take aspirin to prevent strokes.  Have regular diabetes screenings. This involves taking a blood sample to check your fasting blood sugar level.  If you are at a normal weight and have a low risk for diabetes, have this test once every three years after 28 years of age.  If you are overweight and have a high risk for diabetes, consider being tested at a younger age or more often. PREVENTING INFECTION  Hepatitis B  If you have a higher risk for hepatitis B, you should be screened for this virus. You are considered at high risk for hepatitis B if:  You were born in a country where hepatitis B is common. Ask your health care provider which countries are considered high risk.  Your parents were born in a high-risk country, and you have not been immunized against hepatitis B (hepatitis B vaccine).  You have HIV or AIDS.  You use needles to inject street drugs.  You live with someone who has hepatitis B.  You have had sex with someone who has hepatitis B.  You get hemodialysis treatment.  You take certain medicines for conditions, including cancer, organ transplantation, and autoimmune conditions. Hepatitis C  Blood testing is recommended for:  Everyone born from 1945 through 1965.  Anyone with known risk factors for hepatitis C. Sexually transmitted infections (STIs)  You should be screened for sexually transmitted infections (STIs) including gonorrhea and chlamydia if:  You are sexually active and are younger than 28 years of age.  You are older than 28 years of age and your health care provider tells you that you are at risk for this type of infection.  Your sexual activity  has changed since you were last screened and you are at an increased risk for chlamydia or gonorrhea. Ask your health care provider if you are at risk.  If you do not have HIV, but are at risk, it may be recommended that you take a prescription medicine daily to prevent HIV infection. This is called pre-exposure prophylaxis (PrEP). You are considered at risk if:  You are sexually active and do not regularly use condoms or know the   HIV status of your partner(s).  You take drugs by injection.  You are sexually active with a partner who has HIV. Talk with your health care provider about whether you are at high risk of being infected with HIV. If you choose to begin PrEP, you should first be tested for HIV. You should then be tested every 3 months for as long as you are taking PrEP.  PREGNANCY   If you are premenopausal and you may become pregnant, ask your health care provider about preconception counseling.  If you may become pregnant, take 400 to 800 micrograms (mcg) of folic acid every day.  If you want to prevent pregnancy, talk to your health care provider about birth control (contraception). OSTEOPOROSIS AND MENOPAUSE   Osteoporosis is a disease in which the bones lose minerals and strength with aging. This can result in serious bone fractures. Your risk for osteoporosis can be identified using a bone density scan.  If you are 65 years of age or older, or if you are at risk for osteoporosis and fractures, ask your health care provider if you should be screened.  Ask your health care provider whether you should take a calcium or vitamin D supplement to lower your risk for osteoporosis.  Menopause may have certain physical symptoms and risks.  Hormone replacement therapy may reduce some of these symptoms and risks. Talk to your health care provider about whether hormone replacement therapy is right for you.  HOME CARE INSTRUCTIONS   Schedule regular health, dental, and eye  exams.  Stay current with your immunizations.   Do not use any tobacco products including cigarettes, chewing tobacco, or electronic cigarettes.  If you are pregnant, do not drink alcohol.  If you are breastfeeding, limit how much and how often you drink alcohol.  Limit alcohol intake to no more than 1 drink per day for nonpregnant women. One drink equals 12 ounces of beer, 5 ounces of wine, or 1 ounces of hard liquor.  Do not use street drugs.  Do not share needles.  Ask your health care provider for help if you need support or information about quitting drugs.  Tell your health care provider if you often feel depressed.  Tell your health care provider if you have ever been abused or do not feel safe at home.   This information is not intended to replace advice given to you by your health care provider. Make sure you discuss any questions you have with your health care provider.   Document Released: 02/16/2011 Document Revised: 08/24/2014 Document Reviewed: 07/05/2013 Elsevier Interactive Patient Education 2016 [employer]   

## 2015-08-06 NOTE — Assessment & Plan Note (Signed)
1) Anticipatory Guidance: Discussed importance of wearing a seatbelt while driving and not texting while driving; changing batteries in smoke detector at least once annually; wearing suntan lotion when outside; eating a balanced and moderate diet; getting physical activity at least 30 minutes per day.  2) Immunizations / Screenings / Labs:  Tetanus updated today. All other immunizations are up-to-date per recommendations. Due for a dental screen which will be completed independently. All other screenings are up-to-date per recommendations. Obtain CBC, BMET, Lipid profile and TSH.   Overall well exam with minimal risk factors for cardiovascular disease. BMI of 28 indicates overweight status. Recommend fine-tuning nutritional intake through increasing nutrient dense foods and decreasing trend/saturated fats. Continue physical activity outside of work. Hypertension is well controlled with lifestyle management. Continue other healthy lifestyle behaviors and choices. Follow-up prevention exam in 1 year. Follow-up office visit pending blood work.

## 2015-08-06 NOTE — Progress Notes (Signed)
Subjective:    Patient ID: Rachel [name], female    DOB: April 04, 1986, 29 y.o.   MRN: [id number]  Chief Complaint  Patient presents with  . CPE    not fasting     HPI:  Rachel [name] is a 29 y.o. female who presents today for an annual wellness visit.   1) Health Maintenance -   Diet - Regular diet; Averages about 1-2 meals per day consisting of chicken, beef, some fruits and vegetables. Denies caffeine intake.   Exercise - Walks 8 hours per day at work. No structured exercise.    2) Preventative Exams / Immunizations:  Dental -- Due for exam  Vision -- Up to date   Health Maintenance  Topic Date Due  . TETANUS/TDAP  01/31/2006  . INFLUENZA VACCINE  03/17/2016  . PAP SMEAR  01/15/2017  . HIV Screening  Completed  Tetanus  Immunization History  Administered Date(s) Administered  . Influenza,inj,Quad PF,36+ Mos 05/31/2014    No Known Allergies   Outpatient Prescriptions Prior to Visit  Medication Sig Dispense Refill  . naproxen sodium (ANAPROX) 550 MG tablet Take 1 tablet (550 mg total) by mouth 2 (two) times daily as needed for mild pain. 30 tablet 2  . topiramate (TOPAMAX) 50 MG tablet Take 1 tablet (50 mg total) by mouth 2 (two) times daily. 60 tablet 1  . acetaminophen-codeine (TYLENOL #3) 300-30 MG tablet Take 1 tablet by mouth every 8 (eight) hours as needed for moderate pain. 40 tablet 0  . amoxicillin (AMOXIL) 500 MG capsule Take 1 capsule (500 mg total) by mouth 2 (two) times daily. 20 capsule 0  . HYDROcodone-acetaminophen (HYCET) 7.5-325 mg/15 ml solution Take 15 mLs by mouth every 8 (eight) hours as needed for moderate pain. 120 mL 0   No facility-administered medications prior to visit.     Past Medical History  Diagnosis Date  . Hypertension   . Pregnancy induced hypertension   . Shortness of breath     with bronchitis  . GERD (gastroesophageal reflux disease)     pregnancy related  . Anemia     1st pregnancy  . Twin pregnancy  with two placentas and two amniotic sacs 10/05/2012  . Multiple gestation with malpresentation of one fetus or more 10/05/2012  . S/P cesarean section 10/06/2012  . Abnormal Pap smear   . Transient hypertension of pregnancy, with delivery 10/19/2012  . Headache   . Hx of migraines      Past Surgical History  Procedure Laterality Date  . Wisdom tooth extraction    . Cesarean section N/A 10/06/2012    Procedure: CESAREAN SECTION;  Surgeon: Sherron MondayJody Bovard, MD;  Location: [facility];  Service: Obstetrics;  Laterality: N/A;  PRIMARY C/S-TWINS  . Bilateral salpingectomy Bilateral 10/06/2012    Procedure: BILATERAL SALPINGECTOMY;  Surgeon: Sherron MondayJody Bovard, MD;  Location: [facility];  Service: Gynecology;  Laterality: Bilateral;  . Tubal ligation    . Carpal tunnel release Bilateral 04/12/2014    Procedure: RIGHT CARPAL TUNNEL RELEASE, LEFT CARPAL TUNNEL RELEASE;  Surgeon: Cindee SaltGary Kuzma, MD;  Location: [facility];  Service: Orthopedics;  Laterality: Bilateral;     Family History  Problem Relation Age of Onset  . Hypertension Mother   . Asthma Mother   . Hypertension Father   . Cancer Father     esophageal   . Hypertension Maternal Grandmother   . Diabetes Maternal Grandmother   . Cancer Maternal Grandfather   . Cancer Paternal  Grandmother   . Hypertension Paternal Grandmother      Social History   Social History  . Marital Status: Single    Spouse Name: N/A  . Number of Children: 3  . Years of Education: 14   Occupational History  . Production operator    Social History Main Topics  . Smoking status: Former Smoker -- 0.15 packs/day for 3 years    Types: Cigarettes    Quit date: 07/18/2009  . Smokeless tobacco: Never Used  . Alcohol Use: 2.4 oz/week    2 Cans of beer, 2 Shots of liquor per week     Comment: drinks on weekends  . Drug Use: No  . Sexual Activity:    Partners: Male    Birth Control/ Protection: Surgical   Other Topics Concern  . Not on file   Social History  Narrative   Born in [place] and raised in [place]. Currently resides in a townhouse 3 children and boyfriend. No pets. Fun: ride a motorcycle.   Denies religious beliefs to effect health care.    Denies abuse and feels safe at home.       Review of Systems  Constitutional: Denies fever, chills, fatigue, or significant weight gain/loss. HENT: Head: Denies headache or neck pain Ears: Denies changes in hearing, ringing in ears, earache, drainage Nose: Denies discharge, stuffiness, itching, nosebleed, sinus pain Throat: Denies sore throat, hoarseness, dry mouth, sores, thrush Eyes: Denies loss/changes in vision, pain, redness, blurry/double vision, flashing lights Cardiovascular: Denies chest pain/discomfort, tightness, palpitations, shortness of breath with activity, difficulty lying down, swelling, sudden awakening with shortness of breath Respiratory: Denies shortness of breath, cough, sputum production, wheezing Gastrointestinal: Denies dysphasia, heartburn, change in appetite, nausea, change in bowel habits, rectal bleeding, constipation, diarrhea, yellow skin or eyes Genitourinary: Denies frequency, urgency, burning/pain, blood in urine, incontinence, change in urinary strength. Musculoskeletal: Denies muscle/joint pain, stiffness, back pain, redness or swelling of joints, trauma Skin: Denies rashes, lumps, itching, dryness, color changes, or hair/nail changes Neurological: Denies dizziness, fainting, seizures, weakness, numbness, tingling, tremor Psychiatric - Denies nervousness, stress, depression or memory loss Endocrine: Denies heat or cold intolerance, sweating, frequent urination, excessive thirst, changes in appetite Hematologic: Denies ease of bruising or bleeding     Objective:     BP 108/70 mmHg  Pulse 67  Temp(Src) 97.9 F (36.6 C) (Oral)  Resp 18  Ht  (1.702 m)  Wt 179 lb (81.194 kg)  BMI 28.03 kg/m2  SpO2 99% Nursing note and vital signs  reviewed.  Physical Exam  Constitutional: She is oriented to person, place, and time. She appears well-developed and well-nourished.  HENT:  Head: Normocephalic.  Right Ear: Hearing, tympanic membrane, external ear and ear canal normal.  Left Ear: Hearing, tympanic membrane, external ear and ear canal normal.  Nose: Nose normal.  Mouth/Throat: Uvula is midline, oropharynx is clear and moist and mucous membranes are normal.  Eyes: Conjunctivae and EOM are normal. Pupils are equal, round, and reactive to light.  Neck: Neck supple. No JVD present. No tracheal deviation present. No thyromegaly present.  Cardiovascular: Normal rate, regular rhythm, normal heart sounds and intact distal pulses.   Pulmonary/Chest: Effort normal and breath sounds normal.  Abdominal: Soft. Bowel sounds are normal. She exhibits no distension and no mass. There is no tenderness. There is no rebound and no guarding.  Musculoskeletal: Normal range of motion. She exhibits no edema or tenderness.  Lymphadenopathy:    She has no cervical adenopathy.  Neurological: She  is alert and oriented to person, place, and time. She has normal reflexes. No cranial nerve deficit. She exhibits normal muscle tone. Coordination normal.  Skin: Skin is warm and dry.  Psychiatric: She has a normal mood and affect. Her behavior is normal. Judgment and thought content normal.       Assessment & Plan:   Problem List Items Addressed This Visit      Other   Routine general medical examination at a health care facility - Primary    1) Anticipatory Guidance: Discussed importance of wearing a seatbelt while driving and not texting while driving; changing batteries in smoke detector at least once annually; wearing suntan lotion when outside; eating a balanced and moderate diet; getting physical activity at least 30 minutes per day.  2) Immunizations / Screenings / Labs:  Tetanus updated today. All other immunizations are up-to-date per  recommendations. Due for a dental screen which will be completed independently. All other screenings are up-to-date per recommendations. Obtain CBC, BMET, Lipid profile and TSH.   Overall well exam with minimal risk factors for cardiovascular disease. BMI of 28 indicates overweight status. Recommend fine-tuning nutritional intake through increasing nutrient dense foods and decreasing trend/saturated fats. Continue physical activity outside of work. Hypertension is well controlled with lifestyle management. Continue other healthy lifestyle behaviors and choices. Follow-up prevention exam in 1 year. Follow-up office visit pending blood work.       Relevant Orders   CBC   Comprehensive metabolic panel   TSH   Lipid panel

## 2015-08-06 NOTE — Progress Notes (Signed)
Pre visit review using our clinic review tool, if applicable. No additional management support is needed unless otherwise documented below in the visit note. 

## 2015-08-08 NOTE — Telephone Encounter (Signed)
Pt aware.

## 2015-09-19 ENCOUNTER — Ambulatory Visit (INDEPENDENT_AMBULATORY_CARE_PROVIDER_SITE_OTHER): Payer: BLUE CROSS/BLUE SHIELD | Admitting: Neurology

## 2015-09-19 ENCOUNTER — Encounter: Payer: Self-pay | Admitting: Neurology

## 2015-09-19 VITALS — BP 110/70 | HR 79 | Wt 178.2 lb

## 2015-09-19 DIAGNOSIS — G43109 Migraine with aura, not intractable, without status migrainosus: Secondary | ICD-10-CM

## 2015-09-19 MED ORDER — NORTRIPTYLINE HCL 10 MG PO CAPS: 10.0000 mg | ORAL_CAPSULE | Freq: Every day | ORAL | Status: DC

## 2015-09-19 MED ORDER — ZOLMITRIPTAN 5 MG NA SOLN: NASAL | Status: DC

## 2015-09-19 NOTE — Patient Instructions (Signed)
Migraine Recommendations: 1.  Start nortriptyline  at bedtime.  Call in 4 weeks with update and we can adjust dose if needed. 2.  Take zolmitriptan  at earliest onset of headache.  May repeat dose once in 2 hours if needed.  Do not exceed two tablets in 24 hours. 3.  Limit use of pain relievers to no more than 2 days out of the week.  These medications include acetaminophen, ibuprofen, triptans and narcotics.  This will help reduce risk of rebound headaches. 4.  Be aware of common food triggers such as processed sweets, processed foods with nitrites (such as deli meat, hot dogs, sausages), foods with MSG, alcohol (such as wine), chocolate, certain cheeses, certain fruits (dried fruits, some citrus fruit), vinegar, diet soda. 4.  Avoid caffeine 5.  Routine exercise 6.  Proper sleep hygiene 7.  Stay adequately hydrated with water 8.  Keep a headache diary. 9.  Maintain proper stress management. 10.  Do not skip meals. 11.  Consider supplements:  Magnesium oxide  to  daily, riboflavin , Coenzyme Q 10  three times daily 12.  Follow up

## 2015-09-19 NOTE — Progress Notes (Signed)
NEUROLOGY FOLLOW UP OFFICE NOTE  [name] [id number]  HISTORY OF PRESENT ILLNESS: Rachel [name] is a 30 year old right-handed woman with hypertension who follows up for frequent migraines.  UPDATE: She was taking topiramate  twice daily.  Headaches had significantly improved.  She was experiencing side effects such as loss of appetite and altered taste.  Since headaches resolved, she stopped the topamax about 2 months ago. Over the last couple of weeks, headaches have recurred.  Naproxen  ineffective.  HISTORY: Onset:  For a few years (at least 2013) but worse over past 2 months Location:  Bi-frontal (left worse than right) Quality:  squeezing Initial Intensity:  8/10, sometimes 10/10 Aura:  Black spots in her vision Prodrome:  no Associated symptoms:  Photophobia, phonophobia, feels off-balance, once had nausea Initial Duration:  2 hours to all day Initial Frequency:  2-3 times a week Triggers/exacerbating factors:  none Relieving factors:  none Activity:  Able to function but called out 4 times from work in last 2 months  Past abortive therapy:  sumatriptan  (ineffective), Maxalt  (ineffective), Excedrin migraine, Fioricet.  Could not afford Relpax. Past preventative therapy:  propranolol  twice daily (took only for 2 weeks because it was ineffective), venlafaxine (suppressed appetite)  CT of head performed in 2013 for headache was normal.  Family history of headache:  No family history.  No family history of aneurysms.  PAST MEDICAL HISTORY: Past Medical History  Diagnosis Date  . Hypertension   . Pregnancy induced hypertension   . Shortness of breath     with bronchitis  . GERD (gastroesophageal reflux disease)     pregnancy related  . Anemia     1st pregnancy  . 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 10/05/2012  . Multiple gestation with malpresentation of one fetus or more 10/05/2012  . S/P cesarean section 10/06/2012   . Abnormal Pap smear   . Transient hypertension of pregnancy, with delivery 10/19/2012  . Headache   . Hx of migraines     MEDICATIONS: Current Outpatient Prescriptions on File Prior to Visit  Medication Sig Dispense Refill  . naproxen sodium (ANAPROX) 550 MG tablet Take 1 tablet (550 mg total) by mouth 2 (two) times daily as needed for mild pain. 30 tablet 2  . topiramate (TOPAMAX) 50 MG tablet Take 1 tablet (50 mg total) by mouth 2 (two) times daily. 60 tablet 1   No current facility-administered medications on file prior to visit.    ALLERGIES: No Known Allergies  FAMILY HISTORY: Family History  Problem Relation Age of Onset  . Hypertension Mother   . Asthma Mother   . Hypertension Father   . Cancer Father     esophageal   . Hypertension Maternal Grandmother   . Diabetes Maternal Grandmother   . Cancer Maternal Grandfather   . Cancer Paternal Grandmother   . Hypertension Paternal Grandmother     SOCIAL HISTORY: Social History   Social History  . Marital Status: Single    Spouse Name: N/A  . Number of Children: 3  . Years of Education: 14   Occupational History  . Production operator    Social History Main Topics  . Smoking status: Former Smoker -- 0.15 packs/day for 3 years    Types: Cigarettes    Quit date: 07/18/2009  . Smokeless tobacco: Never Used  . Alcohol Use: 2.4 oz/week    2 Cans of beer, 2 Shots of liquor per week  Comment: drinks on weekends  . Drug Use: No  . Sexual Activity:    Partners: Male    Birth Control/ Protection: Surgical   Other Topics Concern  . Not on file   Social History Narrative   Born in [place] and raised in [place]. Currently resides in a townhouse 3 children and boyfriend. No pets. Fun: ride a motorcycle.   Denies religious beliefs to effect health care.    Denies abuse and feels safe at home.     REVIEW OF SYSTEMS: Constitutional: No fevers, chills, or sweats, no generalized fatigue, change in  appetite Eyes: No visual changes, double vision, eye pain Ear, nose and throat: No hearing loss, ear pain, nasal congestion, sore throat Cardiovascular: No chest pain, palpitations Respiratory:  No shortness of breath at rest or with exertion, wheezes GastrointestinaI: No nausea, vomiting, diarrhea, abdominal pain, fecal incontinence Genitourinary:  No dysuria, urinary retention or frequency Musculoskeletal:  No neck pain, back pain Integumentary: No rash, pruritus, skin lesions Neurological: as above Psychiatric: No depression, insomnia, anxiety Endocrine: No palpitations, fatigue, diaphoresis, mood swings, change in appetite, change in weight, increased thirst Hematologic/Lymphatic:  No anemia, purpura, petechiae. Allergic/Immunologic: no itchy/runny eyes, nasal congestion, recent allergic reactions, rashes  PHYSICAL EXAM: Filed Vitals:   09/19/15 0905  BP: 110/70  Pulse: 79   General: No acute distress.  Patient appears well-groomed. Head:  Normocephalic/atraumatic Eyes:  Fundoscopic exam unremarkable without vessel changes, exudates, hemorrhages or papilledema. Neck: supple, no paraspinal tenderness, full range of motion Heart:  Regular rate and rhythm Lungs:  Clear to auscultation bilaterally Back: No paraspinal tenderness Neurological Exam: alert and oriented to person, place, and time. Attention span and concentration intact, recent and remote memory intact, fund of knowledge intact.  Speech fluent and not dysarthric, language intact.  CN II-XII intact. Fundoscopic exam unremarkable without vessel changes, exudates, hemorrhages or papilledema.  Bulk and tone normal, muscle strength 5/5 throughout.  Sensation to light touch, temperature and vibration intact.  Deep tendon reflexes 2+ throughout.  Finger to nose and heel to shin testing intact.  Gait normal.  IMPRESSION: Migraine with and without aura  PLAN: 1.  Will try nortriptyline  at bedtime.  She will call in 4 weeks  with update. 2.  For abortive therapy, will try Zomig  tablets 3.  Lifestyle modification, supplements (magnesium, riboflavin, coenzyme q10) 4.  Follow up in 3-4 months  15 minutes spent face to face with patient, over 50% spent discussing mangement.   Shon Millet, DO  CC:  Jeanine Luz

## 2015-09-23 ENCOUNTER — Telehealth: Payer: Self-pay

## 2015-09-23 NOTE — Telephone Encounter (Signed)
Patient called to say she cannot afford the Zomig. Did drop Zomig prescription savings card in the mail for her. Patient states she would perfer to just continue to take the naproxen 550 mg for her headaches. Pt is taking 2 tablets of the naproxen at one time for relief. Please advise.

## 2015-09-23 NOTE — Telephone Encounter (Signed)
The naproxen is not effective, so I would try the Zomig.  Does she have insurance?  Will she qualify for the prescription savings card?

## 2015-09-23 NOTE — Telephone Encounter (Signed)
Patient does have [facility] [employer]. Savings Card was mailed out to patient. She states she cannot afford it with the prescription card either, it would still be $25/month. I did advise patient you would not write the naproxen rx as she has been taking it (1100 mg per dose). Pt states that is the only thing that has ever helped.

## 2015-09-24 NOTE — Telephone Encounter (Signed)
Pt will contact pharmacist to help with cost. Did suggest pt use manufactures website to find patient assistance that might be helpful to her.

## 2015-09-24 NOTE — Telephone Encounter (Signed)
I really don't know what other abortive therapy would be cheaper.  She already tried and failed sumatriptan, which is typically the cheapest.  Maxalt didn't help.  She said that Relpax was too expensive as well.

## 2015-10-25 ENCOUNTER — Telehealth: Payer: Self-pay | Admitting: Neurology

## 2015-10-25 MED ORDER — GABAPENTIN 100 MG PO CAPS: ORAL_CAPSULE | ORAL | Status: DC

## 2015-10-25 NOTE — Telephone Encounter (Signed)
We can try gabapentin.  Since she is sensitive to medications, I would start at low dose and titrate up slowly: Start 100mg  at bedtime for 7 days, Then 100mg  twice daily for 7 days, Then 200mg  twice daily for 7 days, Then 300mg  twice daily.  She can stop the nortriptyline

## 2015-10-25 NOTE — Telephone Encounter (Signed)
Pt needs to talk to someone about medication [phone]

## 2015-10-25 NOTE — Telephone Encounter (Signed)
RX sent in. Pt aware of recommendations.

## 2015-10-25 NOTE — Telephone Encounter (Signed)
Pt needs to talk to someone about medication please call [phone]

## 2015-10-25 NOTE — Telephone Encounter (Signed)
Spoke w/ patient. States she was talking to a pharmacist who suggested Gabapentin to her for her headaches, suggested she call and ask. Pt states she needs something non drowsy. Is currently taking nortriptyline, but it makes her sleepy, which was great at first, but not the sleepiness seems to linger all day and she is having issues shaking that feeling. Please advise  VM okay if pt doesn't answer.

## 2015-10-25 NOTE — Telephone Encounter (Signed)
Duplicate

## 2015-10-28 ENCOUNTER — Telehealth: Payer: Self-pay | Admitting: Family

## 2015-10-28 NOTE — Telephone Encounter (Signed)
Patient is up for FMLA recert. Due 11/07/2015. She is asking if an appt is needed for recert. Last visit was 08/06/2015. i booked an appt just in case for next Monday. Please advise either way and ill call patient to confirm.

## 2015-10-28 NOTE — Telephone Encounter (Signed)
Called left vm ..

## 2015-10-28 NOTE — Telephone Encounter (Signed)
Yes appointment is needed to document her current status.

## 2015-11-04 ENCOUNTER — Telehealth: Payer: Self-pay | Admitting: Family

## 2015-11-04 ENCOUNTER — Ambulatory Visit (INDEPENDENT_AMBULATORY_CARE_PROVIDER_SITE_OTHER): Payer: BLUE CROSS/BLUE SHIELD | Admitting: Family

## 2015-11-04 ENCOUNTER — Encounter: Payer: Self-pay | Admitting: Family

## 2015-11-04 VITALS — BP 124/68 | HR 70 | Temp 97.9°F | Resp 16 | Ht 67.0 in | Wt 181.0 lb

## 2015-11-04 DIAGNOSIS — G43109 Migraine with aura, not intractable, without status migrainosus: Secondary | ICD-10-CM

## 2015-11-04 NOTE — Assessment & Plan Note (Signed)
Continues to experience migraine headaches at a frequency of approximately 2 per week with varying levels of intensity. Managed with gabapentin and previously prescribed Soma triptan which she was not able to afford. She is currently increasing her gabapentin gradually per neurology instructions. FMLA paperwork completed. Continue current dosage of gabapentin as prescribed by neurology and follow-up with changes per neurology.

## 2015-11-04 NOTE — Telephone Encounter (Signed)
Received patient's FMLA paperwork. Faxed in to [employer] @ [phone]. Sending to charge and scan. Original given back to patient

## 2015-11-04 NOTE — Progress Notes (Signed)
Subjective:    Patient ID: Rachel [name], female    DOB: 07-05-1986, 30 y.o.   MRN: [id number]  Chief Complaint  Patient presents with  . FMLA    go over paperwork    HPI:  Rachel [name] is a 30 y.o. female who  has a past medical history of Hypertension; Pregnancy induced hypertension; Shortness of breath; GERD (gastroesophageal reflux disease); Anemia; 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 (10/05/2012); Multiple gestation with malpresentation of one fetus or more (10/05/2012); S/P cesarean section (10/06/2012); Abnormal Pap smear; Transient hypertension of pregnancy, with delivery (10/19/2012); Headache; and migraines. and presents today for a follow up office visit.   1.) Migraines - Currently working with neurology and managed with gabapentin and needed. She reports taking the medication as prescribed and has no adverse side effects. Also prescribed zolmitriptan which she is not currently taking as it is too expensive. Continues to experience headaches up to 2x per week with 1 of those being a severe headache. The headaches are severe enough at times that it causes her to miss work 1-2x per week. Working on improving headaches with gabapentin. Her next follow up with neurology is in June 2017. Headaches are described as throbbing with sensitivity to light and sound.  No Known Allergies   Current Outpatient Prescriptions on File Prior to Visit  Medication Sig Dispense Refill  . gabapentin (NEURONTIN) 100 MG capsule Start 100mg  at bedtime for 7 days, Then 100mg  twice daily for 7 days, Then 200mg  twice daily for 7 days, Then 300mg  twice daily. 180 capsule 1   No current facility-administered medications on file prior to visit.     Past Surgical History  Procedure Laterality Date  . Wisdom tooth extraction    . Cesarean section N/A 10/06/2012    Procedure: CESAREAN SECTION;  Surgeon: Sherron MondayJody Bovard, MD;  Location: [facility];  Service: Obstetrics;  Laterality: N/A;   PRIMARY C/S-TWINS  . Bilateral salpingectomy Bilateral 10/06/2012    Procedure: BILATERAL SALPINGECTOMY;  Surgeon: Sherron MondayJody Bovard, MD;  Location: [facility];  Service: Gynecology;  Laterality: Bilateral;  . Tubal ligation    . Carpal tunnel release Bilateral 04/12/2014    Procedure: RIGHT CARPAL TUNNEL RELEASE, LEFT CARPAL TUNNEL RELEASE;  Surgeon: Cindee SaltGary Kuzma, MD;  Location: [facility];  Service: Orthopedics;  Laterality: Bilateral;       Review of Systems  Constitutional: Negative for fever and chills.  Musculoskeletal: Negative for neck pain and neck stiffness.  Neurological: Positive for headaches. Negative for seizures and weakness.      Objective:    BP 124/68 mmHg  Pulse 70  Temp(Src) 97.9 F (36.6 C) (Oral)  Resp 16  Ht 5\' 7"  (1.702 m)  Wt 181 lb (82.101 kg)  BMI 28.34 kg/m2  SpO2 99% Nursing note and vital signs reviewed.  Physical Exam  Constitutional: She is oriented to person, place, and time. She appears well-developed and well-nourished. No distress.  Eyes: Conjunctivae and EOM are normal. Pupils are equal, round, and reactive to light.  Cardiovascular: Normal rate, regular rhythm, normal heart sounds and intact distal pulses.   Pulmonary/Chest: Effort normal and breath sounds normal.  Neurological: She is alert and oriented to person, place, and time. No cranial nerve deficit.  Skin: Skin is warm and dry.  Psychiatric: She has a normal mood and affect. Her behavior is normal. Judgment and thought content normal.       Assessment & Plan:   Problem List Items  Addressed This Visit      Cardiovascular and Mediastinum   Migraine with aura and without status migrainosus, not intractable - Primary    Continues to experience migraine headaches at a frequency of approximately 2 per week with varying levels of intensity. Managed with gabapentin and previously prescribed Soma triptan which she was not able to afford. She is currently increasing her gabapentin  gradually per neurology instructions. FMLA paperwork completed. Continue current dosage of gabapentin as prescribed by neurology and follow-up with changes per neurology.      Relevant Medications   naproxen (NAPROSYN) 500 MG tablet

## 2015-11-04 NOTE — Patient Instructions (Signed)
Thank you for choosing [facility].  Summary/Instructions:  Please continue to take your medications as prescribed and follow up with neurology as scheduled.   If your symptoms worsen or fail to improve, please contact our office for further instruction, or in case of emergency go directly to the emergency room at the closest medical facility.    Migraine Headache A migraine headache is an intense, throbbing pain on one or both sides of your head. A migraine can last for 30 minutes to several hours. CAUSES  The exact cause of a migraine headache is not always known. However, a migraine may be caused when nerves in the brain become irritated and release chemicals that cause inflammation. This causes pain. Certain things may also trigger migraines, such as:  Alcohol.  Smoking.  Stress.  Menstruation.  Aged cheeses.  Foods or drinks that contain nitrates, glutamate, aspartame, or tyramine.  Lack of sleep.  Chocolate.  Caffeine.  Hunger.  Physical exertion.  Fatigue.  Medicines used to treat chest pain (nitroglycerine), birth control pills, estrogen, and some blood pressure medicines. SIGNS AND SYMPTOMS  Pain on one or both sides of your head.  Pulsating or throbbing pain.  Severe pain that prevents daily activities.  Pain that is aggravated by any physical activity.  Nausea, vomiting, or both.  Dizziness.  Pain with exposure to bright lights, loud noises, or activity.  General sensitivity to bright lights, loud noises, or smells. Before you get a migraine, you may get warning signs that a migraine is coming (aura). An aura may include:  Seeing flashing lights.  Seeing bright spots, halos, or zigzag lines.  Having tunnel vision or blurred vision.  Having feelings of numbness or tingling.  Having trouble talking.  Having muscle weakness. DIAGNOSIS  A migraine headache is often diagnosed based on:  Symptoms.  Physical exam.  A CT scan or  MRI of your head. These imaging tests cannot diagnose migraines, but they can help rule out other causes of headaches. TREATMENT Medicines may be given for pain and nausea. Medicines can also be given to help prevent recurrent migraines.  HOME CARE INSTRUCTIONS  Only take over-the-counter or prescription medicines for pain or discomfort as directed by your health care provider. The use of long-term narcotics is not recommended.  Lie down in a dark, quiet room when you have a migraine.  Keep a journal to find out what may trigger your migraine headaches. For example, write down:  What you eat and drink.  How much sleep you get.  Any change to your diet or medicines.  Limit alcohol consumption.  Quit smoking if you smoke.  Get 7-9 hours of sleep, or as recommended by your health care provider.  Limit stress.  Keep lights dim if bright lights bother you and make your migraines worse. SEEK IMMEDIATE MEDICAL CARE IF:   Your migraine becomes severe.  You have a fever.  You have a stiff neck.  You have vision loss.  You have muscular weakness or loss of muscle control.  You start losing your balance or have trouble walking.  You feel faint or pass out.  You have severe symptoms that are different from your first symptoms. MAKE SURE YOU:   Understand these instructions.  Will watch your condition.  Will get help right away if you are not doing well or get worse.   This information is not intended to replace advice given to you by your health care provider. Make sure you discuss any questions  you have with your health care provider.   Document Released: 08/03/2005 Document Revised: 08/24/2014 Document Reviewed: 04/10/2013 Elsevier Interactive Patient Education [employer]

## 2015-11-04 NOTE — Progress Notes (Signed)
Pre visit review using our clinic review tool, if applicable. No additional management support is needed unless otherwise documented below in the visit note. 

## 2015-11-18 NOTE — Telephone Encounter (Signed)
refaxed with #6 completed

## 2015-11-18 NOTE — Telephone Encounter (Signed)
Pt stopped in stating that #6 on her FMLA needs the duration of the appts.  Please call if you have any questions.

## 2015-12-05 ENCOUNTER — Telehealth: Payer: Self-pay | Admitting: Neurology

## 2015-12-05 NOTE — Telephone Encounter (Signed)
How about she try the sumatriptan 20mg  nasal spray (1 spray for headache and may repeat 1 spray once in 2 hours if needed, not to exceed 2 sprays in 24 hours)  We can increase gabapentin to 400mg  twice daily.

## 2015-12-05 NOTE — Telephone Encounter (Signed)
Pt called to report headache. States she is taking the 300 mg gabapentin BID, which has been helping. However, for about the last week, pt has been waking from her sleep (in the afternoon, as she works 3rd) with the worst headache ever, pain does dissipate, however lingers all day.    Also pt has no abortive medication as she stated the nasal spray was too expensive for her ($50/month) but she never called back.   Please advise.

## 2015-12-05 NOTE — Telephone Encounter (Signed)
Pt needs to talk to someone about her headaches please call her at [phone]

## 2015-12-09 ENCOUNTER — Telehealth: Payer: Self-pay

## 2015-12-09 ENCOUNTER — Ambulatory Visit: Payer: BLUE CROSS/BLUE SHIELD

## 2015-12-09 MED ORDER — GABAPENTIN 400 MG PO CAPS: ORAL_CAPSULE | ORAL | Status: DC

## 2015-12-09 MED ORDER — SUMATRIPTAN 20 MG/ACT NA SOLN: 20.0000 mg | NASAL | Status: DC | PRN

## 2015-12-09 NOTE — Telephone Encounter (Signed)
New medications sent in. Pt coming in for headache cocktail as well.

## 2015-12-24 ENCOUNTER — Ambulatory Visit (INDEPENDENT_AMBULATORY_CARE_PROVIDER_SITE_OTHER): Payer: BLUE CROSS/BLUE SHIELD | Admitting: Family Medicine

## 2015-12-24 VITALS — BP 124/80 | HR 80 | Temp 98.0°F | Resp 16 | Ht 67.0 in | Wt 192.0 lb

## 2015-12-24 DIAGNOSIS — H65191 Other acute nonsuppurative otitis media, right ear: Secondary | ICD-10-CM

## 2015-12-24 DIAGNOSIS — J209 Acute bronchitis, unspecified: Secondary | ICD-10-CM

## 2015-12-24 MED ORDER — HYDROCODONE-HOMATROPINE 5-1.5 MG/5ML PO SYRP: 5.0000 mL | ORAL_SOLUTION | Freq: Three times a day (TID) | ORAL | Status: DC | PRN

## 2015-12-24 MED ORDER — ALBUTEROL SULFATE 108 (90 BASE) MCG/ACT IN AEPB: 2.0000 | INHALATION_SPRAY | Freq: Four times a day (QID) | RESPIRATORY_TRACT | Status: DC | PRN

## 2015-12-24 MED ORDER — AMOXICILLIN 875 MG PO TABS: 875.0000 mg | ORAL_TABLET | Freq: Two times a day (BID) | ORAL | Status: DC

## 2015-12-24 NOTE — Patient Instructions (Addendum)
Acute Bronchitis °Bronchitis is inflammation of the airways that extend from the windpipe into the lungs (bronchi). The inflammation often causes mucus to develop. This leads to a cough, which is the most common symptom of bronchitis.  °In acute bronchitis, the condition usually develops suddenly and goes away over time, usually in a couple weeks. Smoking, allergies, and asthma can make bronchitis worse. Repeated episodes of bronchitis may cause further lung problems.  °CAUSES °Acute bronchitis is most often caused by the same virus that causes a cold. The virus can spread from person to person (contagious) through coughing, sneezing, and touching contaminated objects. °SIGNS AND SYMPTOMS  °· Cough.   °· Fever.   °· Coughing up mucus.   °· Body aches.   °· Chest congestion.   °· Chills.   °· Shortness of breath.   °· Sore throat.   °DIAGNOSIS  °Acute bronchitis is usually diagnosed through a physical exam. Your health care provider will also ask you questions about your medical history. Tests, such as chest X-rays, are sometimes done to rule out other conditions.  °TREATMENT  °Acute bronchitis usually goes away in a couple weeks. Oftentimes, no medical treatment is necessary. Medicines are sometimes given for relief of fever or cough. Antibiotic medicines are usually not needed but may be prescribed in certain situations. In some cases, an inhaler may be recommended to help reduce shortness of breath and control the cough. A cool mist vaporizer may also be used to help thin bronchial secretions and make it easier to clear the chest.  °HOME CARE INSTRUCTIONS °· Get plenty of rest.   °· Drink enough fluids to keep your urine clear or pale yellow (unless you have a medical condition that requires fluid restriction). Increasing fluids may help thin your respiratory secretions (sputum) and reduce chest congestion, and it will prevent dehydration.   °· Take medicines only as directed by your health care provider. °· If  you were prescribed an antibiotic medicine, finish it all even if you start to feel better. °· Avoid smoking and secondhand smoke. Exposure to cigarette smoke or irritating chemicals will make bronchitis worse. If you are a smoker, consider using nicotine gum or skin patches to help control withdrawal symptoms. Quitting smoking will help your lungs heal faster.   °· Reduce the chances of another bout of acute bronchitis by washing your hands frequently, avoiding people with cold symptoms, and trying not to touch your hands to your mouth, nose, or eyes.   °· Keep all follow-up visits as directed by your health care provider.   °SEEK MEDICAL CARE IF: °Your symptoms do not improve after 1 week of treatment.  °SEEK IMMEDIATE MEDICAL CARE IF: °· You develop an increased fever or chills.   °· You have chest pain.   °· You have severe shortness of breath. °· You have bloody sputum.   °· You develop dehydration. °· You faint or repeatedly feel like you are going to pass out. °· You develop repeated vomiting. °· You develop a severe headache. °MAKE SURE YOU:  °· Understand these instructions. °· Will watch your condition. °· Will get help right away if you are not doing well or get worse. °  °This information is not intended to replace advice given to you by your health care provider. Make sure you discuss any questions you have with your health care provider. °  °Document Released: 09/10/2004 Document Revised: 08/24/2014 Document Reviewed: 01/24/2013 °Elsevier Interactive Patient Education ©2016 Elsevier Inc. °Otitis Media With Effusion °Otitis media with effusion is the presence of fluid in the middle ear. This is   a common problem in children, which often follows ear infections. It may be present for weeks or longer after the infection. Unlike an acute ear infection, otitis media with effusion refers only to fluid behind the ear drum and not infection. Children with repeated ear and sinus infections and allergy problems  are the most likely to get otitis media with effusion. °CAUSES  °The most frequent cause of the fluid buildup is dysfunction of the eustachian tubes. These are the tubes that drain fluid in the ears to the back of the nose (nasopharynx). °SYMPTOMS  °· The main symptom of this condition is hearing loss. As a result, you or your child may: °¨ Listen to the TV at a loud volume. °¨ Not respond to questions. °¨ Ask "what" often when spoken to. °¨ Mistake or confuse one sound or word for another. °· There may be a sensation of fullness or pressure but usually not pain. °DIAGNOSIS  °· Your health care provider will diagnose this condition by examining you or your child's ears. °· Your health care provider may test the pressure in you or your child's ear with a tympanometer. °· A hearing test may be conducted if the problem persists. °TREATMENT  °· Treatment depends on the duration and the effects of the effusion. °· Antibiotics, decongestants, nose drops, and cortisone-type drugs (tablets or nasal spray) may not be helpful. °· Children with persistent ear effusions may have delayed language or behavioral problems. Children at risk for developmental delays in hearing, learning, and speech may require referral to a specialist earlier than children not at risk. °· You or your child's health care provider may suggest a referral to an ear, nose, and throat surgeon for treatment. The following may help restore normal hearing: °¨ Drainage of fluid. °¨ Placement of ear tubes (tympanostomy tubes). °¨ Removal of adenoids (adenoidectomy). °HOME CARE INSTRUCTIONS  °· Avoid secondhand smoke. °· Infants who are breastfed are less likely to have this condition. °· Avoid feeding infants while they are lying flat. °· Avoid known environmental allergens. °· Avoid people who are sick. °SEEK MEDICAL CARE IF:  °· Hearing is not better in 3 months. °· Hearing is worse. °· Ear pain. °· Drainage from the ear. °· Dizziness. °MAKE SURE YOU:   °· Understand these instructions. °· Will watch your condition. °· Will get help right away if you are not doing well or get worse. °  °This information is not intended to replace advice given to you by your health care provider. Make sure you discuss any questions you have with your health care provider. °  °Document Released: 09/10/2004 Document Revised: 08/24/2014 Document Reviewed: 02/28/2013 °Elsevier Interactive Patient Education ©2016 Elsevier Inc. ° °

## 2015-12-24 NOTE — Progress Notes (Signed)
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 She is accompanied by HER 2 daughters.  Patient complains of right ear pain. Her symptoms actually began a week ago with upper respiratory congestion and hoarseness. The last 24 she developed more of a sore throat and right ear pain along with worsening cough.  Patient has a past medical history of bronchitis.  Patient denies fever, nausea, vomiting, asthma, chest pain, hemoptysis, epistaxis  Objective:BP 124/80 mmHg  Pulse 80  Temp(Src) 98 F (36.7 C) (Oral)  Resp 16  Ht 5\' 7"  (1.702 m)  Wt 192 lb (87.091 kg)  BMI 30.06 kg/m2  SpO2 97% HEENT: Bilateral effusions in her ears with some retraction and distortion of the right tympanic membrane. Oropharynx reveals moderately reddened posterior pharynx Chest: Few rhonchi, no rales Heart: Regular no murmur Skin: No rash or eczema Extremities: No edema  Assessment: Otitis media with bronchitis  Plan:Acute nonsuppurative otitis media of right ear - Plan: amoxicillin (AMOXIL) 875 MG tablet  Acute bronchitis, unspecified organism - Plan: Albuterol Sulfate (PROAIR RESPICLICK) 108 (90 Base) MCG/ACT AEPB, HYDROcodone-homatropine (HYCODAN) 5-1.5 MG/5ML syrup  Signed, Sheila OatsKurt Schuyler Olden M.D.

## 2015-12-24 NOTE — Addendum Note (Signed)
Addended by: Isaac BlissGALLOWAY, Jeremiyah Cullens J on: 12/24/2015 12:05 PM   Modules accepted: Kipp BroodSmartSet

## 2016-01-02 ENCOUNTER — Telehealth: Payer: Self-pay

## 2016-01-02 DIAGNOSIS — R059 Cough, unspecified: Secondary | ICD-10-CM

## 2016-01-02 DIAGNOSIS — R05 Cough: Secondary | ICD-10-CM

## 2016-01-02 MED ORDER — ALBUTEROL SULFATE HFA 108 (90 BASE) MCG/ACT IN AERS: 2.0000 | INHALATION_SPRAY | RESPIRATORY_TRACT | Status: DC | PRN

## 2016-01-02 NOTE — Telephone Encounter (Signed)
PATIENT CALLED REQUESTING ANOTHER INHALER.  SHE STATES SHE DOES NOT LIKE THE PROAIR RESPICLICK THAT SHE WAS GIVEN.  PLEASE ADVISE. BEST NUMBER FOR PATIENT IS [id number]

## 2016-01-03 MED ORDER — ALBUTEROL SULFATE HFA 108 (90 BASE) MCG/ACT IN AERS
2.0000 | INHALATION_SPRAY | RESPIRATORY_TRACT | Status: DC | PRN
Start: 2016-01-03 — End: 2016-05-11

## 2016-01-23 ENCOUNTER — Ambulatory Visit: Payer: BLUE CROSS/BLUE SHIELD | Admitting: Neurology

## 2016-01-27 ENCOUNTER — Ambulatory Visit (INDEPENDENT_AMBULATORY_CARE_PROVIDER_SITE_OTHER): Payer: BLUE CROSS/BLUE SHIELD | Admitting: Neurology

## 2016-01-27 ENCOUNTER — Encounter: Payer: Self-pay | Admitting: Neurology

## 2016-01-27 VITALS — BP 126/70 | HR 63 | Ht 67.0 in | Wt 180.0 lb

## 2016-01-27 DIAGNOSIS — G43109 Migraine with aura, not intractable, without status migrainosus: Secondary | ICD-10-CM

## 2016-01-27 DIAGNOSIS — F329 Major depressive disorder, single episode, unspecified: Secondary | ICD-10-CM

## 2016-01-27 DIAGNOSIS — F32A Depression, unspecified: Secondary | ICD-10-CM

## 2016-01-27 MED ORDER — PROPRANOLOL HCL 40 MG PO TABS: 40.0000 mg | ORAL_TABLET | Freq: Two times a day (BID) | ORAL | Status: DC

## 2016-01-27 MED ORDER — SUMATRIPTAN SUCCINATE 3 MG/0.5ML ~~LOC~~ SOAJ: 3.0000 mg | SUBCUTANEOUS | Status: DC

## 2016-01-27 NOTE — Patient Instructions (Signed)
1.  Start propranolol 40mg  twice daily 2.  At earliest onset of headache, take sumatriptan 6mg  injection.  May repeat dose in 1 hour if needed. 3.  Contact us in 4 weeks with update and we can increase dose if needed. 4.  Follow up

## 2016-01-27 NOTE — Progress Notes (Signed)
NEUROLOGY FOLLOW UP OFFICE NOTE  [name] [id number]  HISTORY OF PRESENT ILLNESS: Rachel [name] is a 30 year old right-handed woman with hypertension who follows up for frequent migraines.  UPDATE: Intensity:  8/10 Duration:  2 hours to all day Frequency:  2 days per week Current NSAIDS:  no Current analgesics:  no Current triptans:  sumatriptan  NS (does not tolerate) Current anti-emetic:  no Current muscle relaxants:  no Current anti-anxiolytic:  no Current sleep aide:  no Current Antihypertensive medications:  no Current Antidepressant medications:  gabapentin  twice daily (ineffective) Current Anticonvulsant medications:  no Current Vitamins/Herbal/Supplements:  no Current Antihistamines/Decongestants:  no Other therapy:  No  She reports significant stress at home and work.  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  HISTORY: Onset:  For a few years (at least 2013) but worse over past 2 months Location:  Bi-frontal (left worse than right) Quality:  squeezing Initial Intensity:  8/10, sometimes 10/10 Aura:  Black spots in her vision Prodrome:  no Associated symptoms:  Photophobia, phonophobia, feels off-balance, once had nausea Initial Duration:  2 hours to all day Initial Frequency:  2-3 times a week Triggers/exacerbating factors:  none Relieving factors:  none Activity:  Able to function but called out 4 times from work in last 2 months  Past abortive therapy:  sumatriptan  (ineffective), Maxalt  (ineffective), Excedrin migraine, Fioricet, naproxen  (ineffective).  Could not afford Zomig  tab and Relpax. Past preventative therapy:  propranolol  twice daily (took only for 2 weeks because it was ineffective), venlafaxine (suppressed appetite), topiramate (effective but caused decreased appetite and altered taste), nortriptyline  (drowsiness)  CT of head performed in 2013 for headache  was normal.  Family history of headache:  No family history.  No family history of aneurysms.  PAST MEDICAL HISTORY: Past Medical History  Diagnosis Date  . Hypertension   . Pregnancy induced hypertension   . Shortness of breath     with bronchitis  . GERD (gastroesophageal reflux disease)     pregnancy related  . Anemia     1st pregnancy  . 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 10/05/2012  . Multiple gestation with malpresentation of one fetus or more 10/05/2012  . S/P cesarean section 10/06/2012  . Abnormal Pap smear   . Transient hypertension of pregnancy, with delivery 10/19/2012  . Headache   . Hx of migraines     MEDICATIONS: Current Outpatient Prescriptions on File Prior to Visit  Medication Sig Dispense Refill  . naproxen (NAPROSYN) 500 MG tablet Take 500 mg by mouth 2 (two) times daily with a meal. Reported on 01/27/2016    . albuterol (PROVENTIL HFA;VENTOLIN HFA) 108 (90 Base) MCG/ACT inhaler Inhale 2 puffs into the lungs every 4 (four) hours as needed for wheezing or shortness of breath (cough, shortness of breath or wheezing.). 1 Inhaler 1  . gabapentin (NEURONTIN) 400 MG capsule Take one tablet (400 mg), twice daily,  BID (Patient not taking: Reported on 01/27/2016) 60 capsule 3  . SUMAtriptan (IMITREX) 20 MG/ACT nasal spray Place 1 spray (20 mg total) into the nose every 2 (two) hours as needed for migraine or headache. May repeat in 2 hours if headache persists or recurs. (Patient not taking: Reported on 01/27/2016) 9 Inhaler 01   No current facility-administered medications on file prior to visit.    ALLERGIES: No Known Allergies  FAMILY HISTORY: Family History  Problem Relation Age of Onset  . Hypertension Mother   . Asthma Mother   . Hypertension Father   . Cancer Father     esophageal   . Hypertension Maternal Grandmother   . Diabetes Maternal Grandmother   . Cancer Maternal Grandfather   . Cancer Paternal Grandmother   . Hypertension  Paternal Grandmother     SOCIAL HISTORY: Social History   Social History  . Marital Status: Single    Spouse Name: N/A  . Number of Children: 3  . Years of Education: 14   Occupational History  . Production operator    Social History Main Topics  . Smoking status: Former Smoker -- 0.15 packs/day for 3 years    Types: Cigarettes    Quit date: 07/18/2009  . Smokeless tobacco: Never Used  . Alcohol Use: 2.4 oz/week    2 Cans of beer, 2 Shots of liquor per week     Comment: drinks on weekends  . Drug Use: No  . Sexual Activity:    Partners: Male    Birth Control/ Protection: Surgical   Other Topics Concern  . Not on file   Social History Narrative   [place] Currently resides in a townhouse 3 children and boyfriend. No pets. Fun: ride a motorcycle.   Denies religious beliefs to effect health care.    Denies abuse and feels safe at home.     REVIEW OF SYSTEMS: Constitutional: No fevers, chills, or sweats, no generalized fatigue, change in appetite Eyes: No visual changes, double vision, eye pain Ear, nose and throat: No hearing loss, ear pain, nasal congestion, sore throat Cardiovascular: No chest pain, palpitations Respiratory:  No shortness of breath at rest or with exertion, wheezes GastrointestinaI: No nausea, vomiting, diarrhea, abdominal pain, fecal incontinence Genitourinary:  No dysuria, urinary retention or frequency Musculoskeletal:  No neck pain, back pain Integumentary: No rash, pruritus, skin lesions Neurological: as above Psychiatric: No depression, insomnia, anxiety Endocrine: No palpitations, fatigue, diaphoresis, mood swings, change in appetite, change in weight, increased thirst Hematologic/Lymphatic:  No purpura, petechiae. Allergic/Immunologic: no itchy/runny eyes, nasal congestion, recent allergic reactions, rashes  PHYSICAL EXAM: Filed Vitals:   01/27/16 1004  Pulse: 63  Blood pressure:  126/70  General: No  acute distress.  Patient appears well-groomed.  normal body habitus. Head:  Normocephalic/atraumatic Eyes:  Fundi examined but not visualized Neck: supple, no paraspinal tenderness, full range of motion Heart:  Regular rate and rhythm Lungs:  Clear to auscultation bilaterally Back: No paraspinal tenderness Neurological Exam: alert and oriented to person, place, and time. Attention span and concentration intact, recent and remote memory intact, fund of knowledge intact.  Speech fluent and not dysarthric, language intact.  CN II-XII intact. Bulk and tone normal, muscle strength 5/5 throughout.  Sensation to light touch, temperature and vibration intact.  Deep tendon reflexes 2+ throughout, toes downgoing.  Finger to nose and heel to shin testing intact.  Gait normall  IMPRESSION: Migraine without aura Depression  PLAN: 1.  Start propranolol 40mg  twice daily (she never really gave this medication a try) 2.  Zembrace Symtouch injection 6mg  3.  Referral to behavioral medicine 4.  Follow up in 3 months.  15 minutes spent face to face with patient, over 50% spent discussing management.  Shon MilletAdam Jaffe, DO  CC:  Jeanine LuzGregory Calone, FNP

## 2016-02-04 ENCOUNTER — Ambulatory Visit: Payer: BLUE CROSS/BLUE SHIELD | Admitting: Psychiatry

## 2016-03-12 ENCOUNTER — Ambulatory Visit (INDEPENDENT_AMBULATORY_CARE_PROVIDER_SITE_OTHER): Payer: BLUE CROSS/BLUE SHIELD | Admitting: Family

## 2016-03-12 ENCOUNTER — Encounter: Payer: Self-pay | Admitting: Family

## 2016-03-12 VITALS — BP 108/72 | HR 81 | Temp 98.5°F | Resp 16 | Ht 67.0 in | Wt 192.0 lb

## 2016-03-12 DIAGNOSIS — F329 Major depressive disorder, single episode, unspecified: Secondary | ICD-10-CM

## 2016-03-12 DIAGNOSIS — F322 Major depressive disorder, single episode, severe without psychotic features: Secondary | ICD-10-CM

## 2016-03-12 MED ORDER — VILAZODONE HCL 10 & 20 MG PO KIT: 10.0000 mg | PACK | Freq: Every day | ORAL | 0 refills | Status: DC

## 2016-03-12 NOTE — Assessment & Plan Note (Signed)
PH Q score of 22 indicating severe major depression with no suicidal ideation. Does have previous referral to psychology for counseling. Emphasize importance of multi-faceted approach to treatment including medication and counseling. Patient is willing to go to counseling. Start Viibryd. Proper medication usage and side effects discussed. Advised to seek further care if thoughts of suicide develop or worsen. Follow-up in one month or sooner.

## 2016-03-12 NOTE — Patient Instructions (Signed)
Thank you for choosing [facility].  Summary/Instructions:  Your prescription(s) have been submitted to your pharmacy or been printed and provided for you. Please take as directed and contact our office if you believe you are having problem(s) with the medication(s) or have any questions.  If your symptoms worsen or fail to improve, please contact our office for further instruction, or in case of emergency go directly to the emergency room at the closest medical facility.    Major Depressive Disorder Major depressive disorder is a mental illness. It also may be called clinical depression or unipolar depression. Major depressive disorder usually causes feelings of sadness, hopelessness, or helplessness. Some people with this disorder do not feel particularly sad but lose interest in doing things they used to enjoy (anhedonia). Major depressive disorder also can cause physical symptoms. It can interfere with work, school, relationships, and other normal everyday activities. The disorder varies in severity but is longer lasting and more serious than the sadness we all feel from time to time in our lives. Major depressive disorder often is triggered by stressful life events or major life changes. Examples of these triggers include divorce, loss of your job or home, a move, and the death of a family member or close friend. Sometimes this disorder occurs for no obvious reason at all. People who have family members with major depressive disorder or bipolar disorder are at higher risk for developing this disorder, with or without life stressors. Major depressive disorder can occur at any age. It may occur just once in your life (single episode major depressive disorder). It may occur multiple times (recurrent major depressive disorder). SYMPTOMS People with major depressive disorder have either anhedonia or depressed mood on nearly a daily basis for at least 2 weeks or longer. Symptoms of depressed mood  include:  Feelings of sadness (blue or down in the dumps) or emptiness.  Feelings of hopelessness or helplessness.  Tearfulness or episodes of crying (may be observed by others).  Irritability (children and adolescents). In addition to depressed mood or anhedonia or both, people with this disorder have at least four of the following symptoms:  Difficulty sleeping or sleeping too much.   Significant change (increase or decrease) in appetite or weight.   Lack of energy or motivation.  Feelings of guilt and worthlessness.   Difficulty concentrating, remembering, or making decisions.  Unusually slow movement (psychomotor retardation) or restlessness (as observed by others).   Recurrent wishes for death, recurrent thoughts of self-harm (suicide), or a suicide attempt. People with major depressive disorder commonly have persistent negative thoughts about themselves, other people, and the world. People with severe major depressive disorder may experiencedistorted beliefs or perceptions about the world (psychotic delusions). They also may see or hear things that are not real (psychotic hallucinations). DIAGNOSIS Major depressive disorder is diagnosed through an assessment by your health care provider. Your health care provider will ask aboutaspects of your daily life, such as mood,sleep, and appetite, to see if you have the diagnostic symptoms of major depressive disorder. Your health care provider may ask about your medical history and use of alcohol or drugs, including prescription medicines. Your health care provider also may do a physical exam and blood work. This is because certain medical conditions and the use of certain substances can cause major depressive disorder-like symptoms (secondary depression). Your health care provider also may refer you to a mental health specialist for further evaluation and treatment. TREATMENT It is important to recognize the symptoms of major    depressive disorder and seek treatment. The following treatments can be prescribed for this disorder:   Medicine. Antidepressant medicines usually are prescribed. Antidepressant medicines are thought to correct chemical imbalances in the brain that are commonly associated with major depressive disorder. Other types of medicine may be added if the symptoms do not respond to antidepressant medicines alone or if psychotic delusions or hallucinations occur.  Talk therapy. Talk therapy can be helpful in treating major depressive disorder by providing support, education, and guidance. Certain types of talk therapy also can help with negative thinking (cognitive behavioral therapy) and with relationship issues that trigger this disorder (interpersonal therapy). A mental health specialist can help determine which treatment is best for you. Most people with major depressive disorder do well with a combination of medicine and talk therapy. Treatments involving electrical stimulation of the brain can be used in situations with extremely severe symptoms or when medicine and talk therapy do not work over time. These treatments include electroconvulsive therapy, transcranial magnetic stimulation, and vagal nerve stimulation.   This information is not intended to replace advice given to you by your health care provider. Make sure you discuss any questions you have with your health care provider.   Document Released: 11/28/2012 Document Revised: 08/24/2014 Document Reviewed: 11/28/2012 Elsevier Interactive Patient Education 2016 Elsevier Inc.  

## 2016-03-12 NOTE — Progress Notes (Signed)
Subjective:    Patient ID: Rachel [name], female    DOB: [birth year], 30 y.o.   MRN: [id number]  Chief Complaint  Patient presents with  . Fatigue    constantly tired and has been having chest pain, wants to do a stress test, no energy, some back pain     HPI:  Rachel [name] is a 30 y.o. female who  has a past medical history of Abnormal Pap smear; Anemia; GERD (gastroesophageal reflux disease); Headache; migraines; Hypertension; Multiple gestation with malpresentation of one fetus or more (10/05/2012); Pregnancy induced hypertension; S/P cesarean section (10/06/2012); Shortness of breath; Transient hypertension of pregnancy, with delivery (10/19/2012); and 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 (10/05/2012). and presents today for a follow up office visit.   This is a chronic problem. Continues to experience the associated symptoms of fatigue and lack of energy. Severity is enough that she does not want to do anything. Describes that she gets up and moves around and then feels a lot of fatigue. Also notes decreased libido. Reports that she is eating well. Sleep is disturbed but wakes occasionally. Previous blood work reviewed with no metabolic causes for the fatigue. She does work the night shift. She does have difficulty sleeping during the day. Has been working third shift for about 3.5 years. Occasionally feels like she is under appreaciated by family.  No Known Allergies   Current Outpatient Prescriptions on File Prior to Visit  Medication Sig Dispense Refill  . albuterol (PROVENTIL HFA;VENTOLIN HFA) 108 (90 Base) MCG/ACT inhaler Inhale 2 puffs into the lungs every 4 (four) hours as needed for wheezing or shortness of breath (cough, shortness of breath or wheezing.). 1 Inhaler 1  . naproxen (NAPROSYN) 500 MG tablet Take 500 mg by mouth 2 (two) times daily with a meal. Reported on 01/27/2016    . propranolol (INDERAL) 40 MG tablet Take 1 tablet (40 mg total) by mouth 2  (two) times daily. 60 tablet 0  . SUMAtriptan Succinate (ZEMBRACE SYMTOUCH) 3 MG/0.5ML SOAJ Inject 3 mg into the skin as directed. Use at earliest on set of headache, may repeat in 2 hours if headache persists or reoccurs. 12 pen 1   No current facility-administered medications on file prior to visit.      Review of Systems  Constitutional: Positive for fatigue. Negative for chills and fever.  Respiratory: Negative for chest tightness, shortness of breath and wheezing.   Cardiovascular: Negative for chest pain, palpitations and leg swelling.  Psychiatric/Behavioral: Positive for sleep disturbance. The patient is nervous/anxious.       Objective:    BP 108/72 (BP Location: Left Arm, Patient Position: Sitting, Cuff Size: Large)   Pulse 81   Temp 98.5 F (36.9 C) (Oral)   Resp 16   Ht '5\' 7"'  (1.702 m)   Wt 192 lb (87.1 kg)   SpO2 99%   BMI 30.07 kg/m  Nursing note and vital signs reviewed.  Physical Exam  Constitutional: She is oriented to person, place, and time. She appears well-developed and well-nourished. No distress.  Cardiovascular: Normal rate, regular rhythm, normal heart sounds and intact distal pulses.   Pulmonary/Chest: Effort normal and breath sounds normal.  Neurological: She is alert and oriented to person, place, and time.  Skin: Skin is warm and dry.  Psychiatric: Her behavior is normal. Judgment and thought content normal. She exhibits a depressed mood.    Depression screen [facility] 2/9 03/12/2016  Decreased Interest 3  Down, Depressed,  Hopeless 3  PHQ - 2 Score 6  Altered sleeping 3  Tired, decreased energy 3  Change in appetite 3  Feeling bad or failure about yourself  1  Trouble concentrating 3  Moving slowly or fidgety/restless 3  Suicidal thoughts 0  PHQ-9 Score 22  Difficult doing work/chores Somewhat difficult      Assessment & Plan:   Problem List Items Addressed This Visit      Other   Severe depression - Primary    PH Q score of 22  indicating severe major depression with no suicidal ideation. Does have previous referral to psychology for counseling. Emphasize importance of multi-faceted approach to treatment including medication and counseling. Patient is willing to go to counseling. Start Viibryd. Proper medication usage and side effects discussed. Advised to seek further care if thoughts of suicide develop or worsen. Follow-up in one month or sooner.      Relevant Medications   Vilazodone HCl (VIIBRYD STARTER PACK) 10 & 20 MG KIT    Other Visit Diagnoses   None.      I have discontinued Ms. Wildey's gabapentin and SUMAtriptan. I am also having her start on Vilazodone HCl. Additionally, I am having her maintain her naproxen, albuterol, propranolol, and SUMAtriptan Succinate.   Meds ordered this encounter  Medications  . Vilazodone HCl (VIIBRYD STARTER PACK) 10 & 20 MG KIT    Sig: Take 10-20 mg by mouth daily.    Dispense:  1 kit    Refill:  0    Order Specific Question:   Supervising Provider    Answer:   Pricilla Holm A [[id number]]     Follow-up: Return in about 1 month (around 04/12/2016), or if symptoms worsen or fail to improve.  Mauricio Po, FNP

## 2016-03-23 ENCOUNTER — Telehealth: Payer: Self-pay

## 2016-03-23 NOTE — Telephone Encounter (Signed)
Please advise 

## 2016-03-23 NOTE — Telephone Encounter (Signed)
Vilazodone HCl (VIIBRYD STARTER PACK) 10 & 20 MG KIT [[id number]]   Patient called and states she finished her 12day of the samples of this medication. She believes it is working. She wanted to know if you would call her in an rx for this. OR what her next step needs to be. Please advise or follow up, Thank you.

## 2016-03-24 MED ORDER — VIIBRYD 20 MG PO TABS: 20.0000 mg | ORAL_TABLET | Freq: Every day | ORAL | 1 refills | Status: DC

## 2016-03-24 NOTE — Telephone Encounter (Signed)
Pt aware.

## 2016-03-24 NOTE — Telephone Encounter (Signed)
Refill has been sent to the pharmacy.  

## 2016-03-25 NOTE — Telephone Encounter (Signed)
Pt called in and said that this med is needing a PA, she is wanting to know what she should do.  Can she be off this med long enough for the PA to go though?     Best number  [phone]

## 2016-03-26 NOTE — Telephone Encounter (Signed)
Tammy SoursGreg, can you look at this and see what you think she should do about this med while the PA is taking place with this med?

## 2016-03-26 NOTE — Telephone Encounter (Signed)
We can provide an additional sample until the PA is complete.

## 2016-03-27 ENCOUNTER — Telehealth: Payer: Self-pay

## 2016-03-27 NOTE — Telephone Encounter (Signed)
PA initiated and APPROVED via CoverMyMeds from 02/26/2016 - 03/27/2017. Pharmacy advised via fax

## 2016-04-02 ENCOUNTER — Telehealth: Payer: Self-pay | Admitting: Family

## 2016-04-02 NOTE — Telephone Encounter (Signed)
PLEASE NOTE: All timestamps contained within this report are represented as [place] Standard Time. CONFIDENTIALTY NOTICE: This fax transmission is intended only for the addressee. It contains information that is legally privileged, confidential or otherwise protected from use or disclosure. If you are not the intended recipient, you are strictly prohibited from reviewing, disclosing, copying using or disseminating any of this information or taking any action in reliance on or regarding this information. If you have received this fax in error, please notify us immediately by telephone so that we can arrange for its return to us. Phone: [phone], Toll-Free: [phone], Fax: [phone] Page: 1 of 1 Call Id: [id number] [facility] - Client TELEPHONE ADVICE RECORD [facility] Patient Name: Rachel [name] DOB: 09-30-1985 Initial Comment Caller states c/o blacking out when she stand up. She is not losing consciousness. Nurse Assessment Nurse: Debera Latalston, RN, Tinnie GensJeffrey Date/Time Lamount Cohen(Eastern Time): 04/02/2016 11:47:40 AM Confirm and document reason for call. If symptomatic, describe symptoms. You must click the next button to save text entered. ---Caller states c/o blacking out when she stand up. She is not losing consciousness. Symptoms started over the weekend. Has the patient traveled out of the country within the last 30 days? ---No Does the patient have any new or worsening symptoms? ---Yes Will a triage be completed? ---Yes Related visit to physician within the last 2 weeks? ---No Does the PT have any chronic conditions? (i.e. diabetes, asthma, etc.) ---No Is the patient pregnant or possibly pregnant? (Ask all females between the ages of 6712-55) ---No Is this a behavioral health or substance abuse call? ---No Guidelines Guideline Title Affirmed Question Affirmed Notes Dizziness - Lightheadedness [1] MODERATE dizziness (e.g., interferes with normal  activities) AND [2] has NOT been evaluated by physician for this (Exception: dizziness caused by heat exposure, sudden standing, or poor fluid intake) Final Disposition User See Physician within 24 Hours Loma VistaRalston, RN, BB&T CorporationJeffrey Comments Caller did not want to be seen by another practicioner in the office so appointment scheduled was for first available with PCP. Referrals REFERRED TO PCP OFFICE Disagree/Comply: Comply

## 2016-04-03 ENCOUNTER — Encounter: Payer: Self-pay | Admitting: Family

## 2016-04-03 ENCOUNTER — Ambulatory Visit (INDEPENDENT_AMBULATORY_CARE_PROVIDER_SITE_OTHER): Payer: BLUE CROSS/BLUE SHIELD | Admitting: Family

## 2016-04-03 ENCOUNTER — Telehealth: Payer: Self-pay | Admitting: Family

## 2016-04-03 DIAGNOSIS — G4726 Circadian rhythm sleep disorder, shift work type: Secondary | ICD-10-CM | POA: Diagnosis not present

## 2016-04-03 NOTE — Progress Notes (Signed)
Subjective:    Patient ID: Rachel [name], female    DOB: 01/05/1986, 30 y.o.   MRN: [id number]  Chief Complaint  Patient presents with  . Dizziness    has noticed that she has been jumping in her sleep, has had instances where she would stand up and everything gets black, sxs get bad at work    HPI:  Rachel [name] is a 30 y.o. female who  has a past medical history of Abnormal Pap smear; Anemia; GERD (gastroesophageal reflux disease); Headache; migraines; Hypertension; Multiple gestation with malpresentation of one fetus or more (10/05/2012); Pregnancy induced hypertension; S/P cesarean section (10/06/2012); Shortness of breath; Transient hypertension of pregnancy, with delivery (10/19/2012); and 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 (10/05/2012). and presents today For a follow-up office visit.  This is a new problem. Associated symptom of dizziness has been going on for about 1 week. Describes she has instances where she would stand up and everything gets black. Timing of symptoms is generally worse when she goes to work at night on the third shift. Symptoms improved when she is not working. Also notes a decreased amount of sleep and very vivid dreams where she has to be awakened by her husbad.  No Known Allergies   Current Outpatient Prescriptions on File Prior to Visit  Medication Sig Dispense Refill  . albuterol (PROVENTIL HFA;VENTOLIN HFA) 108 (90 Base) MCG/ACT inhaler Inhale 2 puffs into the lungs every 4 (four) hours as needed for wheezing or shortness of breath (cough, shortness of breath or wheezing.). 1 Inhaler 1  . naproxen (NAPROSYN) 500 MG tablet Take 500 mg by mouth 2 (two) times daily with a meal. Reported on 01/27/2016    . propranolol (INDERAL) 40 MG tablet Take 1 tablet (40 mg total) by mouth 2 (two) times daily. 60 tablet 0  . SUMAtriptan Succinate (ZEMBRACE SYMTOUCH) 3 MG/0.5ML SOAJ Inject 3 mg into the skin as directed. Use at earliest on set of  headache, may repeat in 2 hours if headache persists or reoccurs. 12 pen 1  . VIIBRYD 20 MG TABS Take 1 tablet (20 mg total) by mouth daily. 30 tablet 1   No current facility-administered medications on file prior to visit.      Review of Systems  Constitutional: Negative for chills and fever.  Respiratory: Negative for chest tightness and shortness of breath.   Cardiovascular: Negative for chest pain, palpitations and leg swelling.  Neurological: Positive for dizziness.  Psychiatric/Behavioral: Positive for sleep disturbance.      Objective:    BP 122/84 (BP Location: Left Arm, Patient Position: Sitting, Cuff Size: Large)   Pulse 77   Temp 98.5 F (36.9 C) (Oral)   Resp 16   Ht 5\' 7"  (1.702 m)   Wt 191 lb (86.6 kg)   SpO2 98%   BMI 29.91 kg/m  Nursing note and vital signs reviewed.  Physical Exam  Constitutional: She is oriented to person, place, and time. She appears well-developed and well-nourished. No distress.  Appears tired.  HENT:  Right Ear: Hearing, tympanic membrane, external ear and ear canal normal.  Left Ear: Hearing, tympanic membrane, external ear and ear canal normal.  Eyes: Conjunctivae and EOM are normal. Pupils are equal, round, and reactive to light.  Cardiovascular: Normal rate, regular rhythm, normal heart sounds and intact distal pulses.   Pulmonary/Chest: Effort normal and breath sounds normal.  Neurological: She is alert and oriented to person, place, and time. She has normal  reflexes. No cranial nerve deficit. Coordination normal.  Skin: Skin is warm and dry.  Psychiatric: She has a normal mood and affect. Her behavior is normal. Judgment and thought content normal.       Assessment & Plan:   Problem List Items Addressed This Visit      Other   Shift work sleep disorder    No significant findings on exam to explain symptoms. Orthostatic blood pressures are normal. Symptoms appear to be related to work and lack of good quality sleep during  the day. Given symptoms occur only on work days question possible psychosomatic relation. Refer to sleep medicine for shift work disorder and discussed possibility of finding day shift work. Continue to monitor.       Relevant Orders   Ambulatory referral to Neurology    Other Visit Diagnoses   None.     I am having Ms. Arvidson maintain her naproxen, albuterol, propranolol, SUMAtriptan Succinate, and VIIBRYD.   Follow-up: Return in about 1 month (around 05/04/2016).  Jeanine Luzalone, Gregory, FNP

## 2016-04-03 NOTE — Telephone Encounter (Signed)
[facility] stating [facility] Vibryd so pt can get it cheaper there. Pt has 3 pill left from the trail package, does Tammy SoursGreg wants to give her another one to last her until she gets her med?  Please call her back

## 2016-04-03 NOTE — Patient Instructions (Signed)
Thank you for choosing [facility].  Summary/Instructions:  Please continue taking medications as prescribed.  They will call to schedule your appointment with sleep medicine.  Consider your five-year goals. I recommend reading a book called "Start" by Pearla DubonnetJon Acuff.   If your symptoms worsen or fail to improve, please contact our office for further instruction, or in case of emergency go directly to the emergency room at the closest medical facility.

## 2016-04-03 NOTE — Assessment & Plan Note (Signed)
No significant findings on exam to explain symptoms. Orthostatic blood pressures are normal. Symptoms appear to be related to work and lack of good quality sleep during the day. Given symptoms occur only on work days question possible psychosomatic relation. Refer to sleep medicine for shift work disorder and discussed possibility of finding day shift work. Continue to monitor.

## 2016-04-06 MED ORDER — VIIBRYD 20 MG PO TABS: 20.0000 mg | ORAL_TABLET | Freq: Every day | ORAL | 1 refills | Status: DC

## 2016-04-06 NOTE — Telephone Encounter (Signed)
Pt will come in and pick sample up today. Please be sure to approve the request from [facility] and have the sample up front for her.

## 2016-04-06 NOTE — Telephone Encounter (Signed)
She can pick up an additional sample.

## 2016-04-06 NOTE — Telephone Encounter (Signed)
Medication sent.

## 2016-04-13 ENCOUNTER — Ambulatory Visit: Payer: BLUE CROSS/BLUE SHIELD | Admitting: Family

## 2016-04-15 ENCOUNTER — Ambulatory Visit (HOSPITAL_COMMUNITY): Payer: BLUE CROSS/BLUE SHIELD | Admitting: Psychiatry

## 2016-05-08 ENCOUNTER — Telehealth: Payer: Self-pay

## 2016-05-08 NOTE — Telephone Encounter (Signed)
FMLA paperwork completed, signed, and faxed per pt request to [facility] [phone].  Pt also requested original be scanned into her chart

## 2016-05-11 ENCOUNTER — Encounter: Payer: Self-pay | Admitting: Neurology

## 2016-05-11 ENCOUNTER — Ambulatory Visit (INDEPENDENT_AMBULATORY_CARE_PROVIDER_SITE_OTHER): Payer: BLUE CROSS/BLUE SHIELD | Admitting: Neurology

## 2016-05-11 VITALS — BP 122/86 | HR 78 | Resp 16 | Ht 67.0 in | Wt 197.0 lb

## 2016-05-11 DIAGNOSIS — R519 Headache, unspecified: Secondary | ICD-10-CM

## 2016-05-11 DIAGNOSIS — G4726 Circadian rhythm sleep disorder, shift work type: Secondary | ICD-10-CM | POA: Diagnosis not present

## 2016-05-11 DIAGNOSIS — R419 Unspecified symptoms and signs involving cognitive functions and awareness: Secondary | ICD-10-CM

## 2016-05-11 DIAGNOSIS — G4710 Hypersomnia, unspecified: Secondary | ICD-10-CM | POA: Diagnosis not present

## 2016-05-11 DIAGNOSIS — R51 Headache: Secondary | ICD-10-CM

## 2016-05-11 DIAGNOSIS — Z658 Other specified problems related to psychosocial circumstances: Secondary | ICD-10-CM

## 2016-05-11 DIAGNOSIS — R0683 Snoring: Secondary | ICD-10-CM

## 2016-05-11 DIAGNOSIS — F439 Reaction to severe stress, unspecified: Secondary | ICD-10-CM

## 2016-05-11 DIAGNOSIS — E669 Obesity, unspecified: Secondary | ICD-10-CM

## 2016-05-11 NOTE — Progress Notes (Signed)
Subjective:    Patient ID: Rachel [name] is a 30 y.o. female.  HPI     Rachel Foley, MD, PhD [facility] [address], [address] [address] [place], [place] [address]   Dear Rachel [name],   I saw your patient, Rachel [name], upon your kind request, in my neurologic clinic today for initial consultation of her sleep disorder, in particular shift work sleep disorder. The patient is accompanied by her 29 yo twins today. As you know, Rachel [name] is a 30 year old right-handed woman with an underlying medical history of anemia, reflux disease, recurrent headaches, hypertension, and overweight state, who reports snoring and excessive daytime somnolence, in the context of shift work. She has been working third shift for the past 3-1/2 years. Her Epworth sleepiness score is 12 out of 24 today, her fatigue score is 52 out of 63. She lives with her boyfriend and her 3 children. She quit smoking in 2008, drinks alcohol on weekends about 3 drinks per weekend, drinks excessive caffeine in the form of [facility], about 3 bottles per night, while at work. She does not like to drink coffee. She complains of lack of energy, lack of motivation, mood irritability, memory complaints including forgetfulness. She has quite a bit of stress currently. She is supposed to see a mental health provider but is not sure. She has been on an antidepressant. She denies restless leg symptoms but is restless during her sleep and wakes up jerking her legs or arms. She works from 10 PM to 6:30 AM [name] through Fridays. At home, she has to take care of the 29 yo twins during the day, as they are not in daycare and BF works first shift. Her mother used to help with the little ones but mom has to work as well. She also has a 41-year-old who has to be dropped to school and picked up as there is no school bus service. She is restless, and her mind tends to race. She has difficulty sleeping on WEs as well.  I reviewed  your office note from 04/03/2016.   she tries to be in bed by 8 AM and sometimes has difficulty falling asleep. She is up by 11 AM or before noon on most days because she has to pick up her 49-year-old son. She tries to nap between 6 PM and 9 PM but does not always sleep very well during her naps.   Her Past Medical History Is Significant For: Past Medical History:  Diagnosis Date  . Abnormal Pap smear   . Anemia    1st pregnancy  . GERD (gastroesophageal reflux disease)    pregnancy related  . Headache   . Hx of migraines   . Hypertension   . Multiple gestation with malpresentation of one fetus or more 10/05/2012  . Pregnancy induced hypertension   . S/P cesarean section 10/06/2012  . Shortness of breath    with bronchitis  . Transient hypertension of pregnancy, with delivery 10/19/2012  . 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 10/05/2012    Her Past Surgical History Is Significant For: Past Surgical History:  Procedure Laterality Date  . BILATERAL SALPINGECTOMY Bilateral 10/06/2012   Procedure: BILATERAL SALPINGECTOMY;  Surgeon: Rachel Monday, MD;  Location: [facility];  Service: Gynecology;  Laterality: Bilateral;  . CARPAL TUNNEL RELEASE Bilateral 04/12/2014   Procedure: RIGHT CARPAL TUNNEL RELEASE, LEFT CARPAL TUNNEL RELEASE;  Surgeon: Rachel Salt, MD;  Location: [facility];  Service: Orthopedics;  Laterality: Bilateral;  . CESAREAN SECTION N/A 10/06/2012   Procedure: CESAREAN SECTION;  Surgeon: Rachel Monday, MD;  Location: [facility];  Service: Obstetrics;  Laterality: N/A;  PRIMARY C/S-TWINS  . TUBAL LIGATION    . WISDOM TOOTH EXTRACTION      Her Family History Is Significant For: Family History  Problem Relation Age of Onset  . Hypertension Mother   . Asthma Mother   . Hypertension Father   . Cancer Father     esophageal   . Hypertension Maternal Grandmother   . Diabetes Maternal Grandmother   . Cancer Maternal Grandfather   . Cancer Paternal  Grandmother   . Hypertension Paternal Grandmother     Her Social History Is Significant For: Social History   Social History  . Marital status: Single    Spouse name: N/A  . Number of children: 3  . Years of education: 14   Occupational History  . Production operator    Social History Main Topics  . Smoking status: Former Smoker    Packs/day: 0.15    Years: 3.00    Types: Cigarettes  . Smokeless tobacco: Never Used     Comment: quit 2008  . Alcohol use 2.4 oz/week    2 Cans of beer, 2 Shots of liquor per week     Comment: drinks on weekends  . Drug use: No  . Sexual activity: Yes    Partners: Male    Birth control/ protection: Surgical   Other Topics Concern  . None   Social History Narrative   Born in [place] and raised in [place]. Currently resides in a townhouse 3 children and boyfriend. No pets. Fun: ride a motorcycle.   Denies religious beliefs to effect health care.    Denies abuse and feels safe at home.    Drinks caffeine daily     Her Allergies Are:  No Known Allergies:   Her Current Medications Are:  Outpatient Encounter Prescriptions as of 05/11/2016  Medication Sig  . VIIBRYD 20 MG TABS Take 1 tablet (20 mg total) by mouth daily.  . propranolol (INDERAL) 40 MG tablet Take 1 tablet (40 mg total) by mouth 2 (two) times daily. (Patient not taking: Reported on 05/11/2016)  . SUMAtriptan Succinate (ZEMBRACE SYMTOUCH) 3 MG/0.5ML SOAJ Inject 3 mg into the skin as directed. Use at earliest on set of headache, may repeat in 2 hours if headache persists or reoccurs. (Patient not taking: Reported on 05/11/2016)  . [DISCONTINUED] albuterol (PROVENTIL HFA;VENTOLIN HFA) 108 (90 Base) MCG/ACT inhaler Inhale 2 puffs into the lungs every 4 (four) hours as needed for wheezing or shortness of breath (cough, shortness of breath or wheezing.).  . [DISCONTINUED] naproxen (NAPROSYN) 500 MG tablet Take 500 mg by mouth 2 (two) times daily with a meal. Reported on 01/27/2016    No facility-administered encounter medications on file as of 05/11/2016.   :  Review of Systems:  Out of a complete 14 point review of systems, all are reviewed and negative with the exception of these symptoms as listed below: Review of Systems  Neurological:       Patient works 3rd shift, has trouble falling asleep, snoring, patient feels herself "jump" in her sleep, morning headaches, daytime fatigue, takes naps. Decreased energy, falls asleep when sitting still.   Epworth Sleepiness Scale 0= would never doze 1= slight chance of dozing 2= moderate chance of dozing 3= high chance of dozing  Sitting and reading:2 Watching TV:2 Sitting inactive in a public place (  ex. Theater or meeting):1 As a passenger in a car for an hour without a break:3 Lying down to rest in the afternoon:3 Sitting and talking to someone:0 Sitting quietly after lunch (no alcohol):1 In a car, while stopped in traffic:0 Total:12  Objective:  Neurologic Exam  Physical Exam Physical Examination:   Vitals:   05/11/16 1327  BP: 122/86  Pulse: 78  Resp: 16    General Examination: The patient is a very pleasant 30 y.o. female in no acute distress. She appears well-developed and well-nourished and well groomed.   HEENT: Normocephalic, atraumatic, pupils are equal, round and reactive to light and accommodation. Funduscopic exam is normal with sharp disc margins noted. Extraocular tracking is good without limitation to gaze excursion or nystagmus noted. Normal smooth pursuit is noted. Hearing is grossly intact. Tympanic membranes are clear bilaterally. Face is symmetric with normal facial animation and normal facial sensation. Speech is clear with no dysarthria noted. There is no hypophonia. There is no lip, neck/head, jaw or voice tremor. Neck is supple with full range of passive and active motion. There are no carotid bruits on auscultation. Oropharynx exam reveals: mild mouth dryness, adequate dental hygiene  and moderate airway crowding, due ttonsils of 2+ bilaterally, larger tongue, larger uvula. Mallampati is class II. Tongue protrudes centrally and palate elevates symmetrically. Neck size is 14.5 inches. She has a Mild overbite.   Chest: Clear to auscultation without wheezing, rhonchi or crackles noted.  Heart: S1+S2+0, regular and normal without murmurs, rubs or gallops noted.   Abdomen: Soft, non-tender and non-distended with normal bowel sounds appreciated on auscultation.  Extremities: There is no pitting edema in the distal lower extremities bilaterally. Pedal pulses are intact.  Skin: Warm and dry without trophic changes noted.   Musculoskeletal: exam reveals no obvious joint deformities, tenderness or joint swelling or erythema.   Neurologically:  Mental status: The patient is awake, alert and oriented in all 4 spheres. Her immediate and remote memory, attention, language skills and fund of knowledge are appropriate. There is no evidence of aphasia, agnosia, apraxia or anomia. Speech is clear with normal prosody and enunciation. Thought process is linear. Mood is normal and affect is normal.  Cranial nerves II - XII are as described above under HEENT exam. In addition: shoulder shrug is normal with equal shoulder height noted. Motor exam: Normal bulk, strength and tone is noted. There is no drift, tremor or rebound. Romberg is negative. Reflexes are 2+ throughout. Fine motor skills and coordination: intact with normal finger taps, normal hand movements, normal rapid alternating patting, normal foot taps and normal foot agility.  Cerebellar testing: No dysmetria or intention tremor on finger to nose testing. Heel to shin is unremarkable bilaterally. There is no truncal or gait ataxia.  Sensory exam: intact to light touch, pinprick, vibration, temperature sense in the upper and lower extremities.  Gait, station and balance: She stands easily. No veering to one side is noted. No leaning to one  side is noted. Posture is age-appropriate and stance is narrow based. Gait shows normal stride length and normal pace. No problems turning are noted. Tandem walk is unremarkable.   Assessment and Plan:  In summary, Bernita T Junio is a very pleasant 30 y.o.-year old female with an underlying medical history of anemia, reflux disease, recurrent headaches, hypertension, and overweight state, whose history and physical exam are concerning for obstructive sleep apnea (OSA). Her situation is confounded by shift work sleep disturbance and also exacerbated by chronic sleep deprivation.  She does use quite a bit of caffeine during her work hours as well. I had a long chat with the patient about my findings and the diagnosis of OSA, its prognosis and treatment options. We talked about medical treatments, surgical interventions and non-pharmacological approaches. I explained in particular the risks and ramifications of untreated moderate to severe OSA, especially with respect to developing cardiovascular disease down the Road, including congestive heart failure, difficult to treat hypertension, cardiac arrhythmias, or stroke. Even type 2 diabetes has, in part, been linked to untreated OSA. Symptoms of untreated OSA include daytime sleepiness, memory problems, mood irritability and mood disorder such as depression and anxiety, lack of energy, as well as recurrent headaches, especially morning headaches. We talked about trying to maintain a healthy lifestyle in general, as well as the importance of weight control. I encouraged the patient to eat healthy, exercise daily and keep well hydrated, to keep a scheduled bedtime and wake time routine, to not skip any meals and eat healthy snacks in between meals. I advised the patient not to drive when feeling sleepy.  I recommended the following at this time: sleep study with potential positive airway pressure titration. (We will score hypopneas at 3% and split the sleep study  into diagnostic and treatment portion, if the estimated. 2 hour AHI is >15/h).  We talked about maintaining a scheduled sleep and wake time, and maintaining good sleep hygiene in general. She endorses stress and mood related issues, may benefit from referral to a mental health provider, including a counselor.  I explained the sleep test procedure to the patient and also outlined possible surgical and non-surgical treatment options of OSA, including the use of a custom-made dental device (which would require a referral to a specialist dentist or oral surgeon), upper airway surgical options, such as pillar implants, radiofrequency surgery, tongue base surgery, and UPPP (which would involve a referral to an ENT surgeon). Rarely, jaw surgery such as mandibular advancement may be considered.  I also explained the CPAP treatment option to the patient, who indicated that she would be willing to try CPAP if the need arises. I explained the importance of being compliant with PAP treatment, not only for insurance purposes but primarily to improve Her symptoms, and for the patient's long term health benefit, including to reduce Her cardiovascular risks. I answered all her questions today and the patient was in agreement. I would like to see her back after the sleep study is completed and encouraged her to call with any interim questions, concerns, problems or updates.   Thank you very much for allowing me to participate in the care of this nice patient. If I can be of any further assistance to you please do not hesitate to call me at [phone].  Sincerely,   Rachel Foley, MD, PhD

## 2016-05-11 NOTE — Patient Instructions (Signed)
Based on your symptoms and your exam I believe you are at risk for obstructive sleep apnea or OSA, and I think we should proceed with a sleep study to determine whether you do or do not have OSA and how severe it is. If you have more than mild OSA, I want you to consider treatment with CPAP. Please remember, the risks and ramifications of moderate to severe obstructive sleep apnea or OSA are: Cardiovascular disease, including congestive heart failure, stroke, difficult to control hypertension, arrhythmias, and even type 2 diabetes has been linked to untreated OSA. Sleep apnea causes disruption of sleep and sleep deprivation in most cases, which, in turn, can cause recurrent headaches, problems with memory, mood, concentration, focus, and vigilance. Most people with untreated sleep apnea report excessive daytime sleepiness, which can affect their ability to drive. Please do not drive if you feel sleepy.   I will likely see you back after your sleep study to go over the test results and where to go from there. We will call you after your sleep study to advise about the results (most likely, you will hear from Diana, my nurse) and to set up an appointment at the time, as necessary.    Our sleep lab administrative assistant, Dawn will meet with you or call you to schedule your sleep study. If you don't hear back from her by next week please feel free to call her at [phone]. This is her direct line and please leave a message with your phone number to call back if you get the voicemail box. She will call back as soon as possible.   Please remember to try to maintain good sleep hygiene, which means: Keep a regular sleep and wake schedule, try not to exercise or have a meal within 2 hours of your bedtime, try to keep your bedroom conducive for sleep, that is, cool and dark, without light distractors such as an illuminated alarm clock, and refrain from watching TV right before sleep or in the middle of the night  and do not keep the TV or radio on during the night. Also, try not to use or play on electronic devices at bedtime, such as your cell phone, tablet PC or laptop. If you like to read at bedtime on an electronic device, try to dim the background light as much as possible. Do not eat in the middle of the night.     

## 2016-05-28 ENCOUNTER — Ambulatory Visit: Payer: BLUE CROSS/BLUE SHIELD | Admitting: Neurology

## 2016-05-29 ENCOUNTER — Telehealth: Payer: Self-pay | Admitting: Neurology

## 2016-05-29 ENCOUNTER — Ambulatory Visit (INDEPENDENT_AMBULATORY_CARE_PROVIDER_SITE_OTHER): Payer: BLUE CROSS/BLUE SHIELD | Admitting: Neurology

## 2016-05-29 ENCOUNTER — Encounter: Payer: Self-pay | Admitting: Neurology

## 2016-05-29 VITALS — BP 118/68 | HR 72 | Ht 67.0 in | Wt 192.0 lb

## 2016-05-29 DIAGNOSIS — G43109 Migraine with aura, not intractable, without status migrainosus: Secondary | ICD-10-CM | POA: Diagnosis not present

## 2016-05-29 DIAGNOSIS — F329 Major depressive disorder, single episode, unspecified: Secondary | ICD-10-CM

## 2016-05-29 DIAGNOSIS — F32A Depression, unspecified: Secondary | ICD-10-CM

## 2016-05-29 MED ORDER — ATENOLOL 50 MG PO TABS: 50.0000 mg | ORAL_TABLET | Freq: Every day | ORAL | 2 refills | Status: DC

## 2016-05-29 MED ORDER — IBUPROFEN 800 MG PO TABS: ORAL_TABLET | ORAL | 0 refills | Status: DC

## 2016-05-29 NOTE — Telephone Encounter (Signed)
Patient wants to talk to someone about her medical list please call her at [phone]

## 2016-05-29 NOTE — Telephone Encounter (Signed)
She can take ibuprofen 800mg  up to no more than every 6 hours as needed.  Also, she must limit use to no more than 2 days out of the week.

## 2016-05-29 NOTE — Patient Instructions (Signed)
1.  We are going to try atenolol 50mg  (another blood pressure medication similar to propranolol).  Take tablet daily.  CONTACT ME IN 4 WEEKS WITH UPDATE AND WE CAN INCREASE DOSE IF NEEDED.  2.  Look into these medications with your insurance to see which is most affordable:  Eletriptan (brand name Relpax)  Zolmitriptan (brand name Zomig)  Naratriptan (brand name Amerge)  Frovatriptan (brand name Frova) Contact me and I will prescribe your choice  3.  Follow up in 3 months.

## 2016-05-29 NOTE — Telephone Encounter (Signed)
Pt called to request ibuprofen RX. Stated it was discussed during visit, but was not ordered. Pt stated she can pick up OTC, but would still like recommendation on how much and how often she is to take for headaches over the next month, while new medication gets into system and/or she gets a price list for abortive therapies.

## 2016-05-29 NOTE — Progress Notes (Signed)
NEUROLOGY FOLLOW UP OFFICE NOTE  Rachel [name] [id number]  HISTORY OF PRESENT ILLNESS: Rachel [name] is a 30 year old right-handed woman with hypertension who follows up for frequent migraines.   UPDATE: Intensity:  8/10 Duration:  Until she lays down to sleep Frequency:  2 days per week Current NSAIDS:  Ibuprofen 800mg  (helps) Current analgesics:  no Current triptans:  Zembrace SymTouch injection 6mg  (ineffective) Current anti-emetic:  no Current muscle relaxants:  no Current anti-anxiolytic:  no Current sleep aide:  no Current Antihypertensive medications: no Current Antidepressant medications:  Viibryd Current Anticonvulsant medications:  no Current Vitamins/Herbal/Supplements:  no Current Antihistamines/Decongestants:  no Other therapy:  No   She reports significant stress at home and work.  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  She is currently being evaluated for OSA and other sleep disorders.   HISTORY: Onset:  For a few years (at least 2013) but worse over past 2 months Location:  Bi-frontal (left worse than right) Quality:  squeezing Initial Intensity:  8/10, sometimes 10/10; June: 8/10 Aura:  Black spots in her vision Prodrome:  no Associated symptoms:  Photophobia, phonophobia, feels off-balance, once had nausea Initial Duration:  2 hours to all day; June: 2 hours to all day Initial Frequency:  2-3 times a week: June: 2 days per week Triggers/exacerbating factors:  none Relieving factors:  none Activity:  Able to function but called out 4 times from work in last 2 months   Past abortive therapy:  sumatriptan 50mg  (ineffective), sumatriptan 20mg  NS, Maxalt 10mg  (ineffective), Excedrin migraine, Fioricet, sumatriptan injections, naproxen 550mg  (ineffective).  Could not afford Zomig 5mg  tab and Relpax. Past preventative therapy:  propranolol 40mg  twice daily, venlafaxine (suppressed appetite), topiramate (effective  but caused decreased appetite and altered taste), nortriptyline 10mg  (drowsiness), gabapentin   CT of head performed in 2013 for headache was normal.   Family history of headache:  No family history.  No family history of aneurysms.  PAST MEDICAL HISTORY: Past Medical History:  Diagnosis Date  . Abnormal Pap smear   . Anemia    1st pregnancy  . GERD (gastroesophageal reflux disease)    pregnancy related  . Headache   . Hx of migraines   . Hypertension   . Multiple gestation with malpresentation of one fetus or more 10/05/2012  . Pregnancy induced hypertension   . S/P cesarean section 10/06/2012  . Shortness of breath    with bronchitis  . Transient hypertension of pregnancy, with delivery 10/19/2012  . 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 10/05/2012    MEDICATIONS: Current Outpatient Prescriptions on File Prior to Visit  Medication Sig Dispense Refill  . VIIBRYD 20 MG TABS Take 1 tablet (20 mg total) by mouth daily. 90 tablet 1   No current facility-administered medications on file prior to visit.     ALLERGIES: No Known Allergies  FAMILY HISTORY: Family History  Problem Relation Age of Onset  . Hypertension Mother   . Asthma Mother   . Hypertension Father   . Cancer Father     esophageal   . Hypertension Maternal Grandmother   . Diabetes Maternal Grandmother   . Cancer Maternal Grandfather   . Cancer Paternal Grandmother   . Hypertension Paternal Grandmother     SOCIAL HISTORY: Social History   Social History  . Marital status: Single    Spouse name: N/A  . Number of children: 3  . Years of  education: 14   Occupational History  . Production operator    Social History Main Topics  . Smoking status: Former Smoker    Packs/day: 0.15    Years: 3.00    Types: Cigarettes  . Smokeless tobacco: Never Used     Comment: quit 2008  . Alcohol use 2.4 oz/week    2 Cans of beer, 2 Shots of liquor per week     Comment: drinks on weekends  .  Drug use: No  . Sexual activity: Yes    Partners: Male    Birth control/ protection: Surgical   Other Topics Concern  . Not on file   Social History Narrative   Born in [place] and raised in [place]. Currently resides in a townhouse 3 children and boyfriend. No pets. Fun: ride a motorcycle.   Denies religious beliefs to effect health care.    Denies abuse and feels safe at home.    Drinks caffeine daily     REVIEW OF SYSTEMS: Constitutional: Fatigue.  No fevers, chills, or sweats, change in appetite Eyes: No visual changes, double vision, eye pain Ear, nose and throat: No hearing loss, ear pain, nasal congestion, sore throat Cardiovascular: No chest pain, palpitations Respiratory:  No shortness of breath at rest or with exertion, wheezes GastrointestinaI: No nausea, vomiting, diarrhea, abdominal pain, fecal incontinence Genitourinary:  No dysuria, urinary retention or frequency Musculoskeletal:  No neck pain, back pain Integumentary: No rash, pruritus, skin lesions Neurological: as above Psychiatric: depression, insomnia, Endocrine: No palpitations, diaphoresis, mood swings, change in appetite, change in weight, increased thirst Hematologic/Lymphatic:  No purpura, petechiae. Allergic/Immunologic: no itchy/runny eyes, nasal congestion, recent allergic reactions, rashes  PHYSICAL EXAM: Vitals:   05/29/16 1124  BP: 118/68  Pulse: 72   General: No acute distress.  Patient appears well-groomed.  normal body habitus. Head:  Normocephalic/atraumatic Eyes:  Fundi examined but not visualized Neck: supple, no paraspinal tenderness, full range of motion Heart:  Regular rate and rhythm Lungs:  Clear to auscultation bilaterally Back: No paraspinal tenderness Neurological Exam: alert and oriented to person, place, and time. Attention span and concentration intact, recent and remote memory intact, fund of knowledge intact.  Speech fluent and not dysarthric, language intact.  CN II-XII  intact. Bulk and tone normal, muscle strength 5/5 throughout.  Sensation to light touch  intact.  Deep tendon reflexes 2+ throughout.  Finger to nose testing intact.  Gait normal  IMPRESSION: Migraine.  Stress and disrupted sleep is playing a significant role.  PLAN: 1.  We will try atenolol 50mg  daily 2.  Advised her to check with her insurance to determine which of the following abortive medications may be affordable:  Relpax, Zomig, Amerge, Frova.  She does not wish to use anything intranasal.   3.  Follow up with sleep medicine 4.  Follow up in 3 months.  25 minutes spent face to face with patient, over 50% spent counseling  Shon Millet, DO  CC:  Jeanine Luz, FNP

## 2016-06-03 ENCOUNTER — Ambulatory Visit (HOSPITAL_COMMUNITY): Payer: BLUE CROSS/BLUE SHIELD | Admitting: Psychiatry

## 2016-06-12 ENCOUNTER — Ambulatory Visit (INDEPENDENT_AMBULATORY_CARE_PROVIDER_SITE_OTHER): Payer: BLUE CROSS/BLUE SHIELD | Admitting: Neurology

## 2016-06-12 DIAGNOSIS — G4720 Circadian rhythm sleep disorder, unspecified type: Secondary | ICD-10-CM

## 2016-06-12 DIAGNOSIS — G4710 Hypersomnia, unspecified: Secondary | ICD-10-CM

## 2016-06-17 ENCOUNTER — Telehealth: Payer: Self-pay | Admitting: Neurology

## 2016-06-17 NOTE — Progress Notes (Signed)
PATIENT'S NAME:  Rachel [name], Dayshia DOB:      [birth year]      MR#:    [id number]     DATE OF RECORDING: 06/12/2016 REFERRING M.D.:  Jeanine LuzGregory Calone, FNP Study Performed:   Baseline Polysomnogram HISTORY:  30 year old right-handed woman with an underlying medical history of anemia, reflux disease, recurrent headaches, hypertension, and overweight state, who reports snoring and excessive daytime somnolence, in the context of shift work. She has been working third shift for the past 3-1/2 years The patient endorsed the Epworth Sleepiness Scale at 12 points and the Fatigue Score at 52 points.   The patient's weight 197 pounds with a height of 67 (inches), resulting in a BMI of 30.8 kg/m2. The patient's neck circumference measured 14.5 inches.  CURRENT MEDICATIONS: Viibryd, Inderal, Sumatriptan   PROCEDURE:  This is a multichannel digital polysomnogram utilizing the Somnostar 11.2 system.  Electrodes and sensors were applied and monitored per AASM Specifications.   EEG, EOG, Chin and Limb EMG, were sampled at 200 Hz.  ECG, Snore and Nasal Pressure, Thermal Airflow, Respiratory Effort, CPAP Flow and Pressure, Oximetry was sampled at 50 Hz. Digital video and audio were recorded.      BASELINE STUDY  Lights Out was at 22:13 and Lights On at 05:24.  Total recording time (TRT) was 430 minutes, with a total sleep time (TST) of  418 minutes.   The patient's sleep latency was 3.5 minutes.  REM latency was 97 minutes, which is normal.  The sleep efficiency was 97.2%.     SLEEP ARCHITECTURE: WASO (Wake after sleep onset) was 8.5 minutes with minimal sleep fragmentation noted.  There were 5.5 minutes in Stage N1, 207 minutes Stage N2, 129.5 minutes Stage N3 and 76 minutes in Stage REM.  The percentage of Stage N1 was 1.3%, Stage N2 was 49.5%, which is mildly reduced, Stage N3 was 31.%, which is increased and Stage R (REM sleep) was 18.2%, which is very mildly decreased.   RESPIRATORY ANALYSIS:  There were a total of  20 respiratory events:  7 obstructive apneas, 0 central apneas and 2 mixed apneas with a total of 9 apneas and an apnea index (AI) of 1.3 /hour. There were 11 hypopneas with a hypopnea index of 1.6/hour. The patient also had 0 respiratory event related arousals (RERAs).      Very mild intermittent snoring was noted.   EKG was in keeping with NSR.   The video and audio analysis did not show any abnormal or unusual behaviors, movements, or vocalizations.   The total APNEA/HYPOPNEA INDEX (AHI) was 2.9/hour and the total RESPIRATORY DISTURBANCE INDEX was 2.9/hour.  12 events occurred in REM sleep and 13 events in NREM. The REM AHI was 9.5/hour, versus a non-REM AHI of 1.4/h. The patient spent 221 minutes of total sleep time in the supine position and 197 minutes in non-supine positions. The supine AHI was 2.4 versus a non-supine AHI of 3.3.  OXYGEN SATURATION & C02:  The Wake baseline 02 saturation was 96%, with the lowest being 86%. Time spent below 89% saturation equaled 3 minutes.   PERIODIC LIMB MOVEMENTS:   The patient had a total of 16 Periodic Limb Movements.  The Periodic Limb Movement (PLM) index was 2.3 and the PLM Arousal index was 0/hour.  IMPRESSION:  1.  Dysfunctions associated with sleep stages or arousal from sleep  RECOMMENDATIONS:  1. This study does not demonstrate any significant obstructive or central sleep disordered breathing, no significant snoring, no parasomnias, no PLMD.  This study does not support an intrinsic sleep disorder as a cause of the patient's symptoms. Other causes, including circadian rhythm disturbances, an underlying mood disorder, medication effect and/or an underlying medical problem cannot be ruled out. 2. This study shows sleep fragmentation and abnormal sleep stage percentages; these are nonspecific findings and per se do not signify an intrinsic sleep disorder or a cause for the patient's sleep-related symptoms. Causes include (but are not limited to)  the first night effect of the sleep study, circadian rhythm disturbances, medication effect or an underlying mood disorder or medical problem.  3. The patient should be cautioned not to drive, work at heights, or operate dangerous or heavy equipment when tired or sleepy. Review and reiteration of good sleep hygiene measures should be pursued with any patient. Help with shift work disorder includes, utilization of caffeine at work, light exposure at work, melatonin before sleep and darkening curtains in the bedroom for daytime sleep.  4. The patient will be seen in follow-up [facility] for discussion of the test results and further management strategies. The referring provider will be notified of the test results.   I certify that I have reviewed the entire raw data recording prior to the issuance of this report in accordance with the Standards of Accreditation of the American Academy of Sleep Medicine (AASM)      Huston FoleySaima Meryle Pugmire, MD, PhD Diplomat, American Board of Psychiatry and Neurology  Diplomat, American Board of Sleep Medicine

## 2016-06-17 NOTE — Telephone Encounter (Signed)
Patient referred by Jeanine LuzGregory Calone, seen by me on 05/11/16, diagnostic PSG on 06/12/16.   Please call and notify the patient that the recent sleep study did not show any significant obstructive sleep apnea or intrinsic sleep disorder, she slept well. Please inform patient that I would like to go over the details of the study during a follow up appointment. Arrange a followup appointment. Also, route or fax report to PCP and referring MD, if other than PCP.  Once you have spoken to patient, you can close this encounter.   Thanks,  Huston FoleySaima Paullette Mckain, MD, PhD [facility]

## 2016-06-17 NOTE — Telephone Encounter (Signed)
Routed results to PCP Jeanine Luz(Gregory Calone) per Dr Frances FurbishAthar request via EPIC.

## 2016-06-17 NOTE — Telephone Encounter (Signed)
Called and spoke to patient about results per Dr Frances FurbishAthar note. Made f/u for 09/09/15 at 930am, check in 915am. Advised pt we will forward results to PCP/referring provider. She verbalized understanding.

## 2016-07-06 ENCOUNTER — Ambulatory Visit (HOSPITAL_COMMUNITY): Payer: BLUE CROSS/BLUE SHIELD | Admitting: Psychiatry
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
## 2016-07-08 ENCOUNTER — Encounter (HOSPITAL_COMMUNITY): Payer: Self-pay | Admitting: Psychiatry

## 2016-07-08 ENCOUNTER — Ambulatory Visit (INDEPENDENT_AMBULATORY_CARE_PROVIDER_SITE_OTHER): Payer: BLUE CROSS/BLUE SHIELD | Admitting: Psychiatry

## 2016-07-08 VITALS — BP 130/78 | HR 74 | Ht 67.0 in | Wt 197.8 lb

## 2016-07-08 DIAGNOSIS — Z825 Family history of asthma and other chronic lower respiratory diseases: Secondary | ICD-10-CM

## 2016-07-08 DIAGNOSIS — Z9889 Other specified postprocedural states: Secondary | ICD-10-CM

## 2016-07-08 DIAGNOSIS — R45851 Suicidal ideations: Secondary | ICD-10-CM

## 2016-07-08 DIAGNOSIS — Z833 Family history of diabetes mellitus: Secondary | ICD-10-CM

## 2016-07-08 DIAGNOSIS — F331 Major depressive disorder, recurrent, moderate: Secondary | ICD-10-CM

## 2016-07-08 DIAGNOSIS — Z87891 Personal history of nicotine dependence: Secondary | ICD-10-CM

## 2016-07-08 DIAGNOSIS — Z8249 Family history of ischemic heart disease and other diseases of the circulatory system: Secondary | ICD-10-CM | POA: Diagnosis not present

## 2016-07-08 DIAGNOSIS — Z808 Family history of malignant neoplasm of other organs or systems: Secondary | ICD-10-CM

## 2016-07-08 DIAGNOSIS — Z79899 Other long term (current) drug therapy: Secondary | ICD-10-CM

## 2016-07-08 MED ORDER — DULOXETINE HCL 60 MG PO CPEP: 60.0000 mg | ORAL_CAPSULE | Freq: Every day | ORAL | 0 refills | Status: DC

## 2016-07-08 MED ORDER — DULOXETINE HCL 30 MG PO CPEP: ORAL_CAPSULE | ORAL | 0 refills | Status: DC

## 2016-07-08 MED ORDER — DULOXETINE HCL 30 MG PO CPEP: ORAL_CAPSULE | ORAL | 1 refills | Status: DC

## 2016-07-08 MED ORDER — HYDROXYZINE HCL 25 MG PO TABS: 25.0000 mg | ORAL_TABLET | Freq: Three times a day (TID) | ORAL | 1 refills | Status: DC | PRN

## 2016-07-08 MED ORDER — DULOXETINE HCL 30 MG PO CPEP: 30.0000 mg | ORAL_CAPSULE | Freq: Every day | ORAL | 1 refills | Status: DC

## 2016-07-08 NOTE — Patient Instructions (Addendum)
1. Discontinue Viibryd 2. Start duloxetine 30 mg daily for two weeks, then 60 mg daily 3. Return to clinic in January

## 2016-07-16 ENCOUNTER — Other Ambulatory Visit (HOSPITAL_COMMUNITY): Payer: Self-pay | Admitting: Psychiatry

## 2016-07-16 ENCOUNTER — Telehealth (HOSPITAL_COMMUNITY): Payer: Self-pay

## 2016-07-16 MED ORDER — DULOXETINE HCL 60 MG PO CPEP: 60.0000 mg | ORAL_CAPSULE | Freq: Every day | ORAL | 1 refills | Status: DC

## 2016-07-16 MED ORDER — HYDROXYZINE HCL 25 MG PO TABS: 25.0000 mg | ORAL_TABLET | Freq: Three times a day (TID) | ORAL | 2 refills | Status: DC | PRN

## 2016-07-16 NOTE — Telephone Encounter (Signed)
Ordered hydroxyzine at [facility].

## 2016-07-16 NOTE — Telephone Encounter (Signed)
Medication management - telephone call with patient who was questioning where her Hydro[facility] was as [facility].  Patient requested this nurse to see if Dr. Vanetta ShawlHisada would be willing to reorder from her local [facility] on [address] so she could get this soon as states she is moving and does not think the other order will be sent out.  Agreed to send request to Dr. Vanetta ShawlHisada but could not guarantee she would resend a new order.

## 2016-08-06 ENCOUNTER — Encounter: Payer: Self-pay | Admitting: Family

## 2016-08-06 ENCOUNTER — Ambulatory Visit (INDEPENDENT_AMBULATORY_CARE_PROVIDER_SITE_OTHER): Payer: BLUE CROSS/BLUE SHIELD | Admitting: Family

## 2016-08-06 VITALS — BP 118/80 | HR 73 | Temp 98.5°F | Resp 16 | Ht 67.0 in | Wt 198.0 lb

## 2016-08-06 DIAGNOSIS — Z0000 Encounter for general adult medical examination without abnormal findings: Secondary | ICD-10-CM | POA: Diagnosis not present

## 2016-08-06 MED ORDER — IBUPROFEN-FAMOTIDINE 800-26.6 MG PO TABS: 1.0000 | ORAL_TABLET | Freq: Three times a day (TID) | ORAL | 1 refills | Status: DC | PRN

## 2016-08-06 NOTE — Assessment & Plan Note (Signed)
1) Anticipatory Guidance: Discussed importance of wearing a seatbelt while driving and not texting while driving; changing batteries in smoke detector at least once annually; wearing suntan lotion when outside; eating a balanced and moderate diet; getting physical activity at least 30 minutes per day.  2) Immunizations / Screenings / Labs:  Declines influenza. All other immunizations are up-to-date per recommendations. Obtain vitamin D for vitamin D deficiency screening. Due for a dental exam encouraged to be completed independently. All other screenings are up-to-date per recommendations. Obtain CBC, CMET, and lipid profile.    Overall well exam with risk factors for cardiovascular disease including hypertension and obesity. Recommend weight loss of 5-10% of current body weight through nutrition and physical activity. Blood pressure appears well controlled with current regimen and no adverse side effects. She continues to working night shift job which prevents challenges with family life. Continues to work with sleep medicine. Continue healthy lifestyle behaviors and choices. Follow-up prevention exam in 1 year. Follow-up office visit pending blood work as needed.

## 2016-08-06 NOTE — Progress Notes (Signed)
Subjective:    Patient ID: Rachel [name], female    DOB: 03-Oct-1985, 30 y.o.   MRN: [id number]  Chief Complaint  Patient presents with  . CPE    refill of ibuprofen, not fasting, questions about back    HPI:  [name] is a 30 y.o. female who presents today for an annual wellness visit.   1) Health Maintenance -   Diet - Averages about 1-2 meals with multiple snacks throughout the day consisting of a regular diet; Caffeine intake about 7-8 cups daily.  Exercise - No structured exercise; does walk at work throughout the night.    2) Preventative Exams / Immunizations:  Dental -- Due for exam  Vision -- Up to date   Health Maintenance  Topic Date Due  . INFLUENZA VACCINE  11/14/2016 (Originally 03/17/2016)  . PAP SMEAR  01/15/2017  . TETANUS/TDAP  08/05/2025  . HIV Screening  Completed    Immunization History  Administered Date(s) Administered  . Influenza,inj,Quad PF,36+ Mos 05/31/2014  . Tdap 08/06/2015     No Known Allergies   Outpatient Medications Prior to Visit  Medication Sig Dispense Refill  . atenolol (TENORMIN) 50 MG tablet Take 1 tablet (50 mg total) by mouth daily. 30 tablet 2  . DULoxetine (CYMBALTA) 30 MG capsule 30 mg daily for two weeks then 60 mg daily 60 capsule 0  . DULoxetine (CYMBALTA) 60 MG capsule Take 1 capsule (60 mg total) by mouth daily. 30 capsule 1  . hydrOXYzine (ATARAX/VISTARIL) 25 MG tablet Take 1 tablet (25 mg total) by mouth 3 (three) times daily as needed. 30 tablet 2  . ibuprofen (ADVIL,MOTRIN) 800 MG tablet Take 1 tablet (800 mg total) by mouth every 6 (six) hours as needed. No more than 2 days a week 30 tablet 0  . naproxen (NAPROSYN) 500 MG tablet      No facility-administered medications prior to visit.      Past Medical History:  Diagnosis Date  . Abnormal Pap smear   . Anemia    1st pregnancy  . GERD (gastroesophageal reflux disease)    pregnancy related  . Headache   . Hx of migraines   .  Hypertension   . Multiple gestation with malpresentation of one fetus or more 10/05/2012  . Pregnancy induced hypertension   . S/P cesarean section 10/06/2012  . Shortness of breath    with bronchitis  . Transient hypertension of pregnancy, with delivery 10/19/2012  . 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 10/05/2012     Past Surgical History:  Procedure Laterality Date  . BILATERAL SALPINGECTOMY Bilateral 10/06/2012   Procedure: BILATERAL SALPINGECTOMY;  Surgeon: Sherron Monday, MD;  Location: [facility];  Service: Gynecology;  Laterality: Bilateral;  . CARPAL TUNNEL RELEASE Bilateral 04/12/2014   Procedure: RIGHT CARPAL TUNNEL RELEASE, LEFT CARPAL TUNNEL RELEASE;  Surgeon: Cindee Salt, MD;  Location: [facility];  Service: Orthopedics;  Laterality: Bilateral;  . CESAREAN SECTION N/A 10/06/2012   Procedure: CESAREAN SECTION;  Surgeon: Sherron Monday, MD;  Location: [facility];  Service: Obstetrics;  Laterality: N/A;  PRIMARY C/S-TWINS  . TUBAL LIGATION    . WISDOM TOOTH EXTRACTION       Family History  Problem Relation Age of Onset  . Hypertension Mother   . Asthma Mother   . Hypertension Father   . Cancer Father     esophageal   . Hypertension Maternal Grandmother   . Diabetes Maternal Grandmother   .  Cancer Maternal Grandfather   . Cancer Paternal Grandmother   . Hypertension Paternal Grandmother      Social History   Social History  . Marital status: Single    Spouse name: N/A  . Number of children: 3  . Years of education: 14   Occupational History  . Production operator    Social History Main Topics  . Smoking status: Former Smoker    Packs/day: 0.15    Years: 3.00    Types: Cigarettes  . Smokeless tobacco: Never Used     Comment: quit 2008  . Alcohol use 2.4 oz/week    2 Cans of beer, 2 Shots of liquor per week     Comment: drinks on weekends  . Drug use: No  . Sexual activity: Yes    Partners: Male    Birth control/ protection: Surgical     Other Topics Concern  . Not on file   Social History Narrative   [place] in [place] and raised in [place]. Currently resides in a townhouse 3 children and boyfriend. No pets. Fun: ride a motorcycle.   Denies religious beliefs to effect health care.    Denies abuse and feels safe at home.    Drinks caffeine daily       Review of Systems  Constitutional: Denies fever, chills, fatigue, or significant weight gain/loss. HENT: Head: Denies headache or neck pain Ears: Denies changes in hearing, ringing in ears, earache, drainage Nose: Denies discharge, stuffiness, itching, nosebleed, sinus pain Throat: Denies sore throat, hoarseness, dry mouth, sores, thrush Eyes: Denies loss/changes in vision, pain, redness, blurry/double vision, flashing lights Cardiovascular: Denies chest pain/discomfort, tightness, palpitations, shortness of breath with activity, difficulty lying down, swelling, sudden awakening with shortness of breath Respiratory: Denies shortness of breath, cough, sputum production, wheezing Gastrointestinal: Denies dysphasia, heartburn, change in appetite, nausea, change in bowel habits, rectal bleeding, constipation, diarrhea, yellow skin or eyes Genitourinary: Denies frequency, urgency, burning/pain, blood in urine, incontinence, change in urinary strength. Musculoskeletal: Denies muscle/joint pain, stiffness, back pain, redness or swelling of joints, trauma Skin: Denies rashes, lumps, itching, dryness, color changes, or hair/nail changes Neurological: Denies dizziness, fainting, seizures, weakness, numbness, tingling, tremor Psychiatric - Denies nervousness, stress, depression or memory loss Endocrine: Denies heat or cold intolerance, sweating, frequent urination, excessive thirst, changes in appetite Hematologic: Denies ease of bruising or bleeding     Objective:     BP 118/80 (BP Location: Right Arm, Patient Position: Sitting, Cuff Size: Large)   Pulse 73   Temp 98.5 F  (36.9 C) (Oral)   Resp 16   Ht 5\' 7"  (1.702 m)   Wt 198 lb (89.8 kg)   SpO2 98%   BMI 31.01 kg/m  Nursing note and vital signs reviewed.  Physical Exam  Constitutional: She is oriented to person, place, and time. She appears well-developed and well-nourished.  HENT:  Head: Normocephalic.  Right Ear: Hearing, tympanic membrane, external ear and ear canal normal.  Left Ear: Hearing, tympanic membrane, external ear and ear canal normal.  Nose: Nose normal.  Mouth/Throat: Uvula is midline, oropharynx is clear and moist and mucous membranes are normal.  Eyes: Conjunctivae and EOM are normal. Pupils are equal, round, and reactive to light.  Neck: Neck supple. No JVD present. No tracheal deviation present. No thyromegaly present.  Cardiovascular: Normal rate, regular rhythm, normal heart sounds and intact distal pulses.   Pulmonary/Chest: Effort normal and breath sounds normal.  Abdominal: Soft. Bowel sounds are normal. She exhibits no distension and  no mass. There is no tenderness. There is no rebound and no guarding.  Musculoskeletal: Normal range of motion. She exhibits no edema or tenderness.  Lymphadenopathy:    She has no cervical adenopathy.  Neurological: She is alert and oriented to person, place, and time. She has normal reflexes. No cranial nerve deficit. She exhibits normal muscle tone. Coordination normal.  Skin: Skin is warm and dry.  Psychiatric: She has a normal mood and affect. Her behavior is normal. Judgment and thought content normal.       Assessment & Plan:   Problem List Items Addressed This Visit      Other   Routine general medical examination at a health care facility - Primary    1) Anticipatory Guidance: Discussed importance of wearing a seatbelt while driving and not texting while driving; changing batteries in smoke detector at least once annually; wearing suntan lotion when outside; eating a balanced and moderate diet; getting physical activity at least  30 minutes per day.  2) Immunizations / Screenings / Labs:  Declines influenza. All other immunizations are up-to-date per recommendations. Obtain vitamin D for vitamin D deficiency screening. Due for a dental exam encouraged to be completed independently. All other screenings are up-to-date per recommendations. Obtain CBC, CMET, and lipid profile.    Overall well exam with risk factors for cardiovascular disease including hypertension and obesity. Recommend weight loss of 5-10% of current body weight through nutrition and physical activity. Blood pressure appears well controlled with current regimen and no adverse side effects. She continues to working night shift job which prevents challenges with family life. Continues to work with sleep medicine. Continue healthy lifestyle behaviors and choices. Follow-up prevention exam in 1 year. Follow-up office visit pending blood work as needed.       Relevant Orders   CBC   Comprehensive metabolic panel   Lipid panel   VITAMIN D 25 Hydroxy (Vit-D Deficiency, Fractures)       I have discontinued Ms. Livingood's naproxen, ibuprofen, and hydrOXYzine. I am also having her start on Ibuprofen-Famotidine. Additionally, I am having her maintain her atenolol, DULoxetine, and DULoxetine.   Meds ordered this encounter  Medications  . Ibuprofen-Famotidine 800-26.6 MG TABS    Sig: Take 1 tablet by mouth 3 (three) times daily as needed.    Dispense:  90 tablet    Refill:  1    Order Specific Question:   Supervising Provider    Answer:   Hillard DankerRAWFORD, ELIZABETH A [[id number]]     Follow-up: Return in about 1 year (around 08/06/2017), or if symptoms worsen or fail to improve.   Jeanine Luzalone, Lindi Abram, FNP

## 2016-08-06 NOTE — Patient Instructions (Addendum)
Thank you for choosing [facility].  SUMMARY AND INSTRUCTIONS:  For constipation - Increase fiber and water intake. Can increase fiber through Metamucil or Benefiber.  Consider other OTC medications as needed including Milk of Magnesia, Miralax, Docusate sodium and Ducolox products.   Medication:  Please continue to take your medications as prescribed.  Your prescription(s) have been submitted to your pharmacy or been printed and provided for you. Please take as directed and contact our office if you believe you are having problem(s) with the medication(s) or have any questions.  Labs:  Please stop by the lab on the lower level of the building for your blood work. Your results will be released to [facility] (or called to you) after review, usually within 72 hours after test completion. If any changes need to be made, you will be notified at that same time.  1.) The lab is open from 7:30am to 5:30 pm Monday-Friday 2.) No appointment is necessary 3.) Fasting (if needed) is 6-8 hours after food and drink; black coffee and water are okay   Follow up:  If your symptoms worsen or fail to improve, please contact our office for further instruction, or in case of emergency go directly to the emergency room at the closest medical facility.   Health Maintenance, Female Introduction Adopting a healthy lifestyle and getting preventive care can go a long way to promote health and wellness. Talk with your health care provider about what schedule of regular examinations is right for you. This is a good chance for you to check in with your provider about disease prevention and staying healthy. In between checkups, there are plenty of things you can do on your own. Experts have done a lot of research about which lifestyle changes and preventive measures are most likely to keep you healthy. Ask your health care provider for more information. Weight and diet Eat a healthy diet  Be sure to include  plenty of vegetables, fruits, low-fat dairy products, and lean protein.  Do not eat a lot of foods high in solid fats, added sugars, or salt.  Get regular exercise. This is one of the most important things you can do for your health.  Most adults should exercise for at least 150 minutes each week. The exercise should increase your heart rate and make you sweat (moderate-intensity exercise).  Most adults should also do strengthening exercises at least twice a week. This is in addition to the moderate-intensity exercise. Maintain a healthy weight  Body mass index (BMI) is a measurement that can be used to identify possible weight problems. It estimates body fat based on height and weight. Your health care provider can help determine your BMI and help you achieve or maintain a healthy weight.  For females 29 years of age and older:  A BMI below 18.5 is considered underweight.  A BMI of 18.5 to 24.9 is normal.  A BMI of 25 to 29.9 is considered overweight.  A BMI of 30 and above is considered obese. Watch levels of cholesterol and blood lipids  You should start having your blood tested for lipids and cholesterol at 29 years of age, then have this test every 5 years.  You may need to have your cholesterol levels checked more often if:  Your lipid or cholesterol levels are high.  You are older than 29 years of age.  You are at high risk for heart disease. Cancer screening Lung Cancer  Lung cancer screening is recommended for adults 29-80 years  old who are at high risk for lung cancer because of a history of smoking.  A yearly low-dose CT scan of the lungs is recommended for people who:  Currently smoke.  Have quit within the past 15 years.  Have at least a 30-pack-year history of smoking. A pack year is smoking an average of one pack of cigarettes a day for 1 year.  Yearly screening should continue until it has been 15 years since you quit.  Yearly screening should stop if  you develop a health problem that would prevent you from having lung cancer treatment. Breast Cancer  Practice breast self-awareness. This means understanding how your breasts normally appear and feel.  It also means doing regular breast self-exams. Let your health care provider know about any changes, no matter how small.  If you are in your 20s or 30s, you should have a clinical breast exam (CBE) by a health care provider every 1-3 years as part of a regular health exam.  If you are 29 or older, have a CBE every year. Also consider having a breast X-ray (mammogram) every year.  If you have a family history of breast cancer, talk to your health care provider about genetic screening.  If you are at high risk for breast cancer, talk to your health care provider about having an MRI and a mammogram every year.  Breast cancer gene (BRCA) assessment is recommended for women who have family members with BRCA-related cancers. BRCA-related cancers include:  Breast.  Ovarian.  Tubal.  Peritoneal cancers.  Results of the assessment will determine the need for genetic counseling and BRCA1 and BRCA2 testing. Cervical Cancer  Your health care provider may recommend that you be screened regularly for cancer of the pelvic organs (ovaries, uterus, and vagina). This screening involves a pelvic examination, including checking for microscopic changes to the surface of your cervix (Pap test). You may be encouraged to have this screening done every 3 years, beginning at age 29.  For women ages 29-65, health care providers may recommend pelvic exams and Pap testing every 3 years, or they may recommend the Pap and pelvic exam, combined with testing for human papilloma virus (HPV), every 5 years. Some types of HPV increase your risk of cervical cancer. Testing for HPV may also be done on women of any age with unclear Pap test results.  Other health care providers may not recommend any screening for  nonpregnant women who are considered low risk for pelvic cancer and who do not have symptoms. Ask your health care provider if a screening pelvic exam is right for you.  If you have had past treatment for cervical cancer or a condition that could lead to cancer, you need Pap tests and screening for cancer for at least 20 years after your treatment. If Pap tests have been discontinued, your risk factors (such as having a new sexual partner) need to be reassessed to determine if screening should resume. Some women have medical problems that increase the chance of getting cervical cancer. In these cases, your health care provider may recommend more frequent screening and Pap tests. Colorectal Cancer  This type of cancer can be detected and often prevented.  Routine colorectal cancer screening usually begins at 29 years of age and continues through 29 years of age.  Your health care provider may recommend screening at an earlier age if you have risk factors for colon cancer.  Your health care provider may also recommend using home test kits to  check for hidden blood in the stool.  A small camera at the end of a tube can be used to examine your colon directly (sigmoidoscopy or colonoscopy). This is done to check for the earliest forms of colorectal cancer.  Routine screening usually begins at age 45.  Direct examination of the colon should be repeated every 5-10 years through 29 years of age. However, you may need to be screened more often if early forms of precancerous polyps or small growths are found. Skin Cancer  Check your skin from head to toe regularly.  Tell your health care provider about any new moles or changes in moles, especially if there is a change in a mole's shape or color.  Also tell your health care provider if you have a mole that is larger than the size of a pencil eraser.  Always use sunscreen. Apply sunscreen liberally and repeatedly throughout the day.  Protect yourself  by wearing long sleeves, pants, a wide-brimmed hat, and sunglasses whenever you are outside. Heart disease, diabetes, and high blood pressure  High blood pressure causes heart disease and increases the risk of stroke. High blood pressure is more likely to develop in:  People who have blood pressure in the high end of the normal range (130-139/85-89 mm Hg).  People who are overweight or obese.  People who are African American.  If you are 41-56 years of age, have your blood pressure checked every 3-5 years. If you are 60 years of age or older, have your blood pressure checked every year. You should have your blood pressure measured twice-once when you are at a hospital or clinic, and once when you are not at a hospital or clinic. Record the average of the two measurements. To check your blood pressure when you are not at a hospital or clinic, you can use:  An automated blood pressure machine at a pharmacy.  A home blood pressure monitor.  If you are between 22 years and 70 years old, ask your health care provider if you should take aspirin to prevent strokes.  Have regular diabetes screenings. This involves taking a blood sample to check your fasting blood sugar level.  If you are at a normal weight and have a low risk for diabetes, have this test once every three years after 29 years of age.  If you are overweight and have a high risk for diabetes, consider being tested at a younger age or more often. Preventing infection Hepatitis B  If you have a higher risk for hepatitis B, you should be screened for this virus. You are considered at high risk for hepatitis B if:  You were born in a country where hepatitis B is common. Ask your health care provider which countries are considered high risk.  Your parents were born in a high-risk country, and you have not been immunized against hepatitis B (hepatitis B vaccine).  You have HIV or AIDS.  You use needles to inject street  drugs.  You live with someone who has hepatitis B.  You have had sex with someone who has hepatitis B.  You get hemodialysis treatment.  You take certain medicines for conditions, including cancer, organ transplantation, and autoimmune conditions. Hepatitis C  Blood testing is recommended for:  Everyone born from [birth year] through 1964.  Anyone with known risk factors for hepatitis C. Sexually transmitted infections (STIs)  You should be screened for sexually transmitted infections (STIs) including gonorrhea and chlamydia if:  You are sexually active and  are younger than 29 years of age.  You are older than 29 years of age and your health care provider tells you that you are at risk for this type of infection.  Your sexual activity has changed since you were last screened and you are at an increased risk for chlamydia or gonorrhea. Ask your health care provider if you are at risk.  If you do not have HIV, but are at risk, it may be recommended that you take a prescription medicine daily to prevent HIV infection. This is called pre-exposure prophylaxis (PrEP). You are considered at risk if:  You are sexually active and do not regularly use condoms or know the HIV status of your partner(s).  You take drugs by injection.  You are sexually active with a partner who has HIV. Talk with your health care provider about whether you are at high risk of being infected with HIV. If you choose to begin PrEP, you should first be tested for HIV. You should then be tested every 3 months for as long as you are taking PrEP. Pregnancy  If you are premenopausal and you may become pregnant, ask your health care provider about preconception counseling.  If you may become pregnant, take 400 to 800 micrograms (mcg) of folic acid every day.  If you want to prevent pregnancy, talk to your health care provider about birth control (contraception). Osteoporosis and menopause  Osteoporosis is a disease in  which the bones lose minerals and strength with aging. This can result in serious bone fractures. Your risk for osteoporosis can be identified using a bone density scan.  If you are 73 years of age or older, or if you are at risk for osteoporosis and fractures, ask your health care provider if you should be screened.  Ask your health care provider whether you should take a calcium or vitamin D supplement to lower your risk for osteoporosis.  Menopause may have certain physical symptoms and risks.  Hormone replacement therapy may reduce some of these symptoms and risks. Talk to your health care provider about whether hormone replacement therapy is right for you. Follow these instructions at home:  Schedule regular health, dental, and eye exams.  Stay current with your immunizations.  Do not use any tobacco products including cigarettes, chewing tobacco, or electronic cigarettes.  If you are pregnant, do not drink alcohol.  If you are breastfeeding, limit how much and how often you drink alcohol.  Limit alcohol intake to no more than 1 drink per day for nonpregnant women. One drink equals 12 ounces of beer, 5 ounces of wine, or 1 ounces of hard liquor.  Do not use street drugs.  Do not share needles.  Ask your health care provider for help if you need support or information about quitting drugs.  Tell your health care provider if you often feel depressed.  Tell your health care provider if you have ever been abused or do not feel safe at home. This information is not intended to replace advice given to you by your health care provider. Make sure you discuss any questions you have with your health care provider. Document Released: 02/16/2011 Document Revised: 01/09/2016 Document Reviewed: 05/07/2015  2017 [employer]

## 2016-08-20 ENCOUNTER — Emergency Department (HOSPITAL_COMMUNITY)
Admission: EM | Admit: 2016-08-20 | Discharge: 2016-08-21 | Disposition: A | Discharge status: Home / Self Care | Payer: BLUE CROSS/BLUE SHIELD | Attending: Emergency Medicine | Admitting: Emergency Medicine

## 2016-08-20 ENCOUNTER — Encounter (HOSPITAL_COMMUNITY): Payer: Self-pay | Admitting: Emergency Medicine

## 2016-08-20 DIAGNOSIS — I10 Essential (primary) hypertension: Secondary | ICD-10-CM | POA: Diagnosis not present

## 2016-08-20 DIAGNOSIS — G43909 Migraine, unspecified, not intractable, without status migrainosus: Secondary | ICD-10-CM | POA: Diagnosis present

## 2016-08-20 DIAGNOSIS — Z79899 Other long term (current) drug therapy: Secondary | ICD-10-CM | POA: Diagnosis not present

## 2016-08-20 DIAGNOSIS — Z87891 Personal history of nicotine dependence: Secondary | ICD-10-CM | POA: Diagnosis not present

## 2016-08-20 NOTE — ED Triage Notes (Signed)
Patient reports migraine headache onset this morning with mild nausea and photophobia , denies emesis or fever .

## 2016-08-21 MED ORDER — KETOROLAC TROMETHAMINE 30 MG/ML IJ SOLN
30.0000 mg | Freq: Once | INTRAMUSCULAR | Status: DC
  Filled 2016-08-21: qty 1

## 2016-08-21 MED ORDER — DIPHENHYDRAMINE HCL 25 MG PO CAPS
25.0000 mg | ORAL_CAPSULE | Freq: Once | ORAL | Status: AC
  Administered 2016-08-21: 25 mg via ORAL
  Filled 2016-08-21: qty 1

## 2016-08-21 MED ORDER — PROCHLORPERAZINE EDISYLATE 5 MG/ML IJ SOLN
10.0000 mg | Freq: Once | INTRAMUSCULAR | Status: DC
  Filled 2016-08-21: qty 2

## 2016-08-21 MED ORDER — PROCHLORPERAZINE EDISYLATE 5 MG/ML IJ SOLN
10.0000 mg | Freq: Once | INTRAMUSCULAR | Status: AC
  Administered 2016-08-21: 10 mg via INTRAMUSCULAR

## 2016-08-21 MED ORDER — DEXAMETHASONE SODIUM PHOSPHATE 10 MG/ML IJ SOLN
10.0000 mg | Freq: Once | INTRAMUSCULAR | Status: AC
  Administered 2016-08-21: 10 mg via INTRAMUSCULAR

## 2016-08-21 MED ORDER — DEXAMETHASONE SODIUM PHOSPHATE 10 MG/ML IJ SOLN
10.0000 mg | Freq: Once | INTRAMUSCULAR | Status: DC
  Filled 2016-08-21: qty 1

## 2016-08-21 MED ORDER — DIPHENHYDRAMINE HCL 50 MG/ML IJ SOLN
12.5000 mg | Freq: Once | INTRAMUSCULAR | Status: DC
  Filled 2016-08-21: qty 1

## 2016-08-21 MED ORDER — SODIUM CHLORIDE 0.9 % IV BOLUS (SEPSIS): 500.0000 mL | Freq: Once | INTRAVENOUS | Status: DC

## 2016-08-21 MED ORDER — KETOROLAC TROMETHAMINE 60 MG/2ML IM SOLN
60.0000 mg | Freq: Once | INTRAMUSCULAR | Status: AC
  Administered 2016-08-21: 60 mg via INTRAMUSCULAR
  Filled 2016-08-21: qty 2

## 2016-08-21 NOTE — Discharge Instructions (Signed)
1. Medications: usual home medications 2. Treatment: rest, drink plenty of fluids,  3. Follow Up: Please followup with your primary doctor in 3-5 days for discussion of your diagnoses and further evaluation after today's visit; if you do not have a primary care doctor use the resource guide provided to find one; Please return to the ER for persistent symptoms, numbness, tingling or other concerns

## 2016-08-21 NOTE — ED Provider Notes (Signed)
[facility]-EMER[facility]ENCY DEPT Provider Note   CSN: [id number] Arrival date & time: 08/20/16  2153     History   Chief Complaint Chief Complaint  Patient presents with  . Migraine    HPI Rachel [name] is a 31 y.o. female with a hx of Migraine headache (managed by a [place] neurology), anemia, hypertension presents to the Emergency Department complaining of gradual, persistent, progressively worsening left-sided frontal headache onset at 4 AM this morning. Patient reports she is to be taking atenolol or migraine prophylaxis however she does not feel that it has been helping and she has not taken it in several weeks.  She reports intermittent headaches this week consistent with previous migraines. She reports her headache today is consistent with her previous migraines. She reports she has a follow-up with neurology in February. No treatments prior to arrival. Patient reports associated photophobia and phonophobia. No nausea or vomiting, fever or chills, cough or congestion, rhinorrhea, weakness, dizziness, tingling.  The history is provided by the patient and medical records. No language interpreter was used.    Past Medical History:  Diagnosis Date  . Abnormal Pap smear   . Anemia    1st pregnancy  . GERD (gastroesophageal reflux disease)    pregnancy related  . Headache   . Hx of migraines   . Hypertension   . Multiple gestation with malpresentation of one fetus or more 10/05/2012  . Pregnancy induced hypertension   . S/P cesarean section 10/06/2012  . Shortness of breath    with bronchitis  . Transient hypertension of pregnancy, with delivery 10/19/2012  . 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 10/05/2012    Patient Active Problem List   Diagnosis Date Noted  . Major depressive disorder, recurrent episode, moderate (HCC) 07/08/2016  . Shift work sleep disorder 04/03/2016  . Routine general medical examination at a health care facility 08/06/2015  . Severe depression  (HCC) 01/09/2015  . Migraine with aura and without status migrainosus, not intractable 12/04/2014  . Migraine 09/14/2014  . Essential hypertension 09/14/2014  . Transient hypertension of pregnancy, with delivery 10/19/2012  . S/P cesarean section 10/06/2012  . 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 10/05/2012  . Multiple gestation with malpresentation of one fetus or more 10/05/2012    Past Surgical History:  Procedure Laterality Date  . BILATERAL SALPINGECTOMY Bilateral 10/06/2012   Procedure: BILATERAL SALPINGECTOMY;  Surgeon: Sherron Monday, MD;  Location: [facility];  Service: Gynecology;  Laterality: Bilateral;  . CARPAL TUNNEL RELEASE Bilateral 04/12/2014   Procedure: RIGHT CARPAL TUNNEL RELEASE, LEFT CARPAL TUNNEL RELEASE;  Surgeon: Cindee Salt, MD;  Location: [facility];  Service: Orthopedics;  Laterality: Bilateral;  . CESAREAN SECTION N/A 10/06/2012   Procedure: CESAREAN SECTION;  Surgeon: Sherron Monday, MD;  Location: [facility];  Service: Obstetrics;  Laterality: N/A;  PRIMARY C/S-TWINS  . TUBAL LIGATION    . WISDOM TOOTH EXTRACTION      OB History    Gravida Para Term Preterm AB Living   2 2 2     3    SAB TAB Ectopic Multiple Live Births         1 2       Home Medications    Prior to Admission medications   Medication Sig Start Date End Date Taking? Authorizing Provider  atenolol (TENORMIN) 50 MG tablet Take 1 tablet (50 mg total) by mouth daily. 05/29/16   Drema Dallas, DO  DULoxetine (CYMBALTA) 30 MG  capsule 30 mg daily for two weeks then 60 mg daily 07/08/16   Neysa Hotter, MD  DULoxetine (CYMBALTA) 60 MG capsule Take 1 capsule (60 mg total) by mouth daily. 07/16/16   Neysa Hotter, MD  Ibuprofen-Famotidine 800-26.6 MG TABS Take 1 tablet by mouth 3 (three) times daily as needed. 08/06/16   Veryl Speak, FNP    Family History Family History  Problem Relation Age of Onset  . Hypertension Mother   . Asthma Mother   . Hypertension Father     . Cancer Father     esophageal   . Hypertension Maternal Grandmother   . Diabetes Maternal Grandmother   . Cancer Maternal Grandfather   . Cancer Paternal Grandmother   . Hypertension Paternal Grandmother     Social History Social History  Substance Use Topics  . Smoking status: Former Smoker    Packs/day: 0.15    Years: 3.00    Types: Cigarettes  . Smokeless tobacco: Never Used     Comment: quit 2008  . Alcohol use 2.4 oz/week    2 Cans of beer, 2 Shots of liquor per week     Comment: drinks on weekends     Allergies   Patient has no known allergies.   Review of Systems Review of Systems  Eyes: Positive for photophobia.  Neurological: Positive for headaches.  All other systems reviewed and are negative.    Physical Exam Updated Vital Signs BP 121/79 (BP Location: Left Arm)   Pulse 81   Temp 98.2 F (36.8 C)   Resp 18   Ht 5\' 7"  (1.702 m)   Wt 96.2 kg   SpO2 100%   BMI 33.20 kg/m   Physical Exam  Constitutional: She is oriented to person, place, and time. She appears well-developed and well-nourished. No distress.  HENT:  Head: Normocephalic and atraumatic.  Mouth/Throat: Oropharynx is clear and moist.  Eyes: Conjunctivae and EOM are normal. Pupils are equal, round, and reactive to light. No scleral icterus.  No horizontal, vertical or rotational nystagmus  Neck: Normal range of motion. Neck supple.  Full active and passive ROM without pain No midline or paraspinal tenderness No nuchal rigidity or meningeal signs  Cardiovascular: Normal rate, regular rhythm and intact distal pulses.   Pulmonary/Chest: Effort normal and breath sounds normal. No respiratory distress. She has no wheezes. She has no rales.  Abdominal: Soft. Bowel sounds are normal. There is no tenderness. There is no rebound and no guarding.  Musculoskeletal: Normal range of motion.  Lymphadenopathy:    She has no cervical adenopathy.  Neurological: She is alert and oriented to person,  place, and time. No cranial nerve deficit. She exhibits normal muscle tone. Coordination normal.  Mental Status:  Alert, oriented, thought content appropriate. Speech fluent without evidence of aphasia. Able to follow 2 step commands without difficulty.  Cranial Nerves:  II:  Peripheral visual fields grossly normal, pupils equal, round, reactive to light III,IV, VI: ptosis not present, extra-ocular motions intact bilaterally  V,VII: smile symmetric, facial light touch sensation equal VIII: hearing grossly normal bilaterally  IX,X: midline uvula rise  XI: bilateral shoulder shrug equal and strong XII: midline tongue extension  Motor:  5/5 in upper and lower extremities bilaterally including strong and equal grip strength and dorsiflexion/plantar flexion Sensory: Pinprick and light touch normal in all extremities.  Cerebellar: normal finger-to-nose with bilateral upper extremities Gait: normal gait and balance CV: distal pulses palpable throughout   Skin: Skin is warm and dry.  No rash noted. She is not diaphoretic.  Psychiatric: She has a normal mood and affect. Her behavior is normal. Judgment and thought content normal.  Nursing note and vitals reviewed.    ED Treatments / Results   Procedures Procedures (including critical care time)  Medications Ordered in ED Medications  prochlorperazine (COMPAZINE) injection 10 mg (not administered)  ketorolac (TORADOL) injection 60 mg (not administered)  dexamethasone (DECADRON) injection 10 mg (not administered)  diphenhydrAMINE (BENADRYL) capsule 25 mg (not administered)     Initial Impression / Assessment and Plan / ED Course  I have reviewed the triage vital signs and the nursing notes.  Pertinent labs & imaging results that were available during my care of the patient were reviewed by me and considered in my medical decision making (see chart for details).  Clinical Course as of Aug 21 100  Caleen EssexFri Aug 21, 2016  0100 Patient  initially consented to IV medications however now wishes for IM so she may be discharged home. Normal neurologic exam.  [HM]    Clinical Course User Index [HM] Markeita Alicia, PA-C    Pt HA treated in ED.  Presentation is like pts typical HA and non concerning for [facility], ICH, Meningitis, or temporal arteritis. Pt is afebrile with no focal neuro deficits, nuchal rigidity, or change in vision. Pt is to follow up with PCP to discuss alternate prophylactic medication. Pt verbalizes understanding and is agreeable with plan to dc.    Final Clinical Impressions(s) / ED Diagnoses   Final diagnoses:  Migraine without status migrainosus, not intractable, unspecified migraine type    New Prescriptions New Prescriptions   No medications on file     Dierdre ForthHannah Azavier Creson, PA-C 08/21/16 0102    Zadie Rhineonald Wickline, MD 08/21/16 [phone]

## 2016-08-21 NOTE — ED Notes (Signed)
Pt unable to sign dc instructions due to no sig pad on mobile computer

## 2016-08-28 ENCOUNTER — Ambulatory Visit: Payer: BLUE CROSS/BLUE SHIELD | Admitting: Women's Health

## 2016-09-08 ENCOUNTER — Telehealth: Payer: Self-pay

## 2016-09-08 ENCOUNTER — Ambulatory Visit: Payer: Self-pay | Admitting: Neurology

## 2016-09-08 NOTE — Telephone Encounter (Signed)
Patient did not show to appt today  

## 2016-09-09 ENCOUNTER — Encounter: Payer: Self-pay | Admitting: Neurology

## 2016-09-17 ENCOUNTER — Ambulatory Visit: Payer: BLUE CROSS/BLUE SHIELD | Admitting: Neurology

## 2016-09-18 ENCOUNTER — Encounter: Payer: Self-pay | Admitting: Neurology

## 2016-09-21 ENCOUNTER — Telehealth: Payer: Self-pay | Admitting: Neurology

## 2016-09-21 NOTE — Telephone Encounter (Signed)
Patient dismissed from [facility] by Shon MilletAdam Jaffe DO , effective September 18, 2016. Dismissal letter sent out by certified / registered mail.  DAJ
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
## 2016-09-25 NOTE — Telephone Encounter (Signed)
Received signed domestic return receipt verifying delivery of certified letter on September 23, 2016. Article number [id number] DAJ

## 2016-09-28 ENCOUNTER — Encounter (HOSPITAL_COMMUNITY): Payer: Self-pay | Admitting: Psychiatry

## 2016-09-28 ENCOUNTER — Ambulatory Visit (INDEPENDENT_AMBULATORY_CARE_PROVIDER_SITE_OTHER): Payer: BLUE CROSS/BLUE SHIELD | Admitting: Psychiatry

## 2016-09-28 VITALS — BP 132/84 | HR 75 | Ht 66.5 in | Wt 196.0 lb

## 2016-09-28 DIAGNOSIS — Z825 Family history of asthma and other chronic lower respiratory diseases: Secondary | ICD-10-CM

## 2016-09-28 DIAGNOSIS — Z9851 Tubal ligation status: Secondary | ICD-10-CM | POA: Diagnosis not present

## 2016-09-28 DIAGNOSIS — Z801 Family history of malignant neoplasm of trachea, bronchus and lung: Secondary | ICD-10-CM

## 2016-09-28 DIAGNOSIS — F331 Major depressive disorder, recurrent, moderate: Secondary | ICD-10-CM | POA: Diagnosis not present

## 2016-09-28 DIAGNOSIS — Z79899 Other long term (current) drug therapy: Secondary | ICD-10-CM

## 2016-09-28 DIAGNOSIS — Z9889 Other specified postprocedural states: Secondary | ICD-10-CM

## 2016-09-28 DIAGNOSIS — Z833 Family history of diabetes mellitus: Secondary | ICD-10-CM

## 2016-09-28 DIAGNOSIS — Z87891 Personal history of nicotine dependence: Secondary | ICD-10-CM

## 2016-09-28 DIAGNOSIS — R45851 Suicidal ideations: Secondary | ICD-10-CM

## 2016-09-28 DIAGNOSIS — Z8249 Family history of ischemic heart disease and other diseases of the circulatory system: Secondary | ICD-10-CM

## 2016-09-28 MED ORDER — SERTRALINE HCL 50 MG PO TABS
ORAL_TABLET | ORAL | 1 refills | Status: DC
Start: 2016-09-28 — End: 2016-10-26

## 2016-09-28 MED ORDER — SERTRALINE HCL 25 MG PO TABS
ORAL_TABLET | ORAL | 0 refills | Status: DC
Start: 2016-09-28 — End: 2016-10-26

## 2016-09-28 NOTE — Patient Instructions (Signed)
1. Decrease duloxetine 30 mg daily 2. Start sertraline 25 mg daily for one week, then 50 mg daily for one week, then 100 mg daily 3. Return to clinic in one month

## 2016-10-22 ENCOUNTER — Ambulatory Visit (INDEPENDENT_AMBULATORY_CARE_PROVIDER_SITE_OTHER): Payer: BLUE CROSS/BLUE SHIELD | Admitting: Emergency Medicine

## 2016-10-22 VITALS — BP 148/78 | HR 86 | Temp 98.9°F | Ht 66.5 in | Wt 193.2 lb

## 2016-10-22 DIAGNOSIS — M791 Myalgia, unspecified site: Secondary | ICD-10-CM

## 2016-10-22 DIAGNOSIS — R51 Headache: Secondary | ICD-10-CM | POA: Diagnosis not present

## 2016-10-22 DIAGNOSIS — J111 Influenza due to unidentified influenza virus with other respiratory manifestations: Secondary | ICD-10-CM | POA: Insufficient documentation

## 2016-10-22 DIAGNOSIS — R519 Headache, unspecified: Secondary | ICD-10-CM

## 2016-10-22 MED ORDER — HYDROCODONE-ACETAMINOPHEN 5-325 MG PO TABS: 1.0000 | ORAL_TABLET | Freq: Four times a day (QID) | ORAL | 0 refills | Status: DC | PRN

## 2016-10-22 MED ORDER — OSELTAMIVIR PHOSPHATE 75 MG PO CAPS: 75.0000 mg | ORAL_CAPSULE | Freq: Two times a day (BID) | ORAL | 0 refills | Status: AC

## 2016-10-22 NOTE — Patient Instructions (Addendum)
     IF you received an x-ray today, you will receive an invoice from [facility]. Please contact [facility] at [phone] with questions or concerns regarding your invoice.   IF you received labwork today, you will receive an invoice from [facility]. Please contact [facility] at [phone] with questions or concerns regarding your invoice.   Our billing staff will not be able to assist you with questions regarding bills from these companies.  You will be contacted with the lab results as soon as they are available. The fastest way to get your results is to activate your My Chart account. Instructions are located on the last page of this paperwork. If you have not heard from us regarding the results in 2 weeks, please contact this office.      Influenza, Adult Influenza ("the flu") is an infection in the lungs, nose, and throat (respiratory tract). It is caused by a virus. The flu causes many common cold symptoms, as well as a high fever and body aches. It can make you feel very sick. The flu spreads easily from person to person (is contagious). Getting a flu shot (influenza vaccination) every year is the best way to prevent the flu. Follow these instructions at home:  Take over-the-counter and prescription medicines only as told by your doctor.  Use a cool mist humidifier to add moisture (humidity) to the air in your home. This can make it easier to breathe.  Rest as needed.  Drink enough fluid to keep your pee (urine) clear or pale yellow.  Cover your mouth and nose when you cough or sneeze.  Wash your hands with soap and water often, especially after you cough or sneeze. If you cannot use soap and water, use hand sanitizer.  Stay home from work or school as told by your doctor. Unless you are visiting your doctor, try to avoid leaving home until your fever has been gone for 24 hours without the use of medicine.  Keep all follow-up visits as told by your doctor.  This is important. How is this prevented?  Getting a yearly (annual) flu shot is the best way to avoid getting the flu. You may get the flu shot in late summer, fall, or winter. Ask your doctor when you should get your flu shot.  Wash your hands often or use hand sanitizer often.  Avoid contact with people who are sick during cold and flu season.  Eat healthy foods.  Drink plenty of fluids.  Get enough sleep.  Exercise regularly. Contact a doctor if:  You get new symptoms.  You have:  Chest pain.  Watery poop (diarrhea).  A fever.  Your cough gets worse.  You start to have more mucus.  You feel sick to your stomach (nauseous).  You throw up (vomit). Get help right away if:  You start to be short of breath or have trouble breathing.  Your skin or nails turn a bluish color.  You have very bad pain or stiffness in your neck.  You get a sudden headache.  You get sudden pain in your face or ear.  You cannot stop throwing up. This information is not intended to replace advice given to you by your health care provider. Make sure you discuss any questions you have with your health care provider. Document Released: 05/12/2008 Document Revised: 01/09/2016 Document Reviewed: 05/28/2015 Elsevier Interactive Patient Education  2017 [employer]  

## 2016-10-22 NOTE — Progress Notes (Signed)
Rachel [name] 31 y.o.   Chief Complaint  Patient presents with  . Headache    X 2-3 day  . Cough    X 1 day  with chills and runny nose  . Generalized Body Aches    X 1 day    HISTORY OF PRESENT ILLNESS: This is a 30 y.o. female complaining of flu-symptoms since yesterday. Acute onset and got very sick very fast.  Influenza  This is a new problem. The current episode started yesterday. The problem occurs constantly. The problem has been rapidly worsening. Associated symptoms include anorexia, chills, coughing, fatigue, a fever, headaches, myalgias and weakness. Pertinent negatives include no abdominal pain, chest pain, congestion, nausea, neck pain, rash, sore throat, urinary symptoms, vertigo or vomiting.     Prior to Admission medications   Medication Sig Start Date End Date Taking? Authorizing Provider  Ibuprofen-Famotidine 800-26.6 MG TABS Take 1 tablet by mouth 3 (three) times daily as needed. 08/06/16  Yes Veryl Speak, FNP  atenolol (TENORMIN) 50 MG tablet Take 1 tablet (50 mg total) by mouth daily. Patient not taking: Reported on 10/22/2016 05/29/16   Drema Dallas, DO  HYDROcodone-acetaminophen (NORCO) 5-325 MG tablet Take 1 tablet by mouth every 6 (six) hours as needed for moderate pain. 10/22/16   Estil Vallee Victorino December, MD  oseltamivir (TAMIFLU) 75 MG capsule Take 1 capsule (75 mg total) by mouth 2 (two) times daily. 10/22/16 10/27/16  Georgina Quint, MD  sertraline (ZOLOFT) 25 MG tablet Take 25 mg daily for one week Patient not taking: Reported on 10/22/2016 09/28/16   Neysa Hotter, MD  sertraline (ZOLOFT) 50 MG tablet Take 25 mg daily for one week, then 50 mg daily for one week, then 100 mg daily Patient not taking: Reported on 10/22/2016 09/28/16   Neysa Hotter, MD    No Known Allergies  Patient Active Problem List   Diagnosis Date Noted  . Major depressive disorder, recurrent episode, moderate (HCC) 07/08/2016  . Shift work sleep disorder 04/03/2016  . Routine  general medical examination at a health care facility 08/06/2015  . Severe depression (HCC) 01/09/2015  . Migraine with aura and without status migrainosus, not intractable 12/04/2014  . Migraine 09/14/2014  . Essential hypertension 09/14/2014  . Transient hypertension of pregnancy, with delivery 10/19/2012  . S/P cesarean section 10/06/2012  . 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 10/05/2012  . Multiple gestation with malpresentation of one fetus or more 10/05/2012    Past Medical History:  Diagnosis Date  . Abnormal Pap smear   . Anemia    1st pregnancy  . GERD (gastroesophageal reflux disease)    pregnancy related  . Headache   . Hx of migraines   . Hypertension   . Multiple gestation with malpresentation of one fetus or more 10/05/2012  . Pregnancy induced hypertension   . S/P cesarean section 10/06/2012  . Shortness of breath    with bronchitis  . Transient hypertension of pregnancy, with delivery 10/19/2012  . 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 10/05/2012    Past Surgical History:  Procedure Laterality Date  . BILATERAL SALPINGECTOMY Bilateral 10/06/2012   Procedure: BILATERAL SALPINGECTOMY;  Surgeon: Sherron Monday, MD;  Location: [facility];  Service: Gynecology;  Laterality: Bilateral;  . CARPAL TUNNEL RELEASE Bilateral 04/12/2014   Procedure: RIGHT CARPAL TUNNEL RELEASE, LEFT CARPAL TUNNEL RELEASE;  Surgeon: Cindee Salt, MD;  Location: [facility];  Service: Orthopedics;  Laterality: Bilateral;  .  CESAREAN SECTION N/A 10/06/2012   Procedure: CESAREAN SECTION;  Surgeon: Sherron Monday, MD;  Location: [facility];  Service: Obstetrics;  Laterality: N/A;  PRIMARY C/S-TWINS  . TUBAL LIGATION    . WISDOM TOOTH EXTRACTION      Social History   Social History  . Marital status: Single    Spouse name: N/A  . Number of children: 3  . Years of education: 14   Occupational History  . Production operator    Social History Main Topics  .  Smoking status: Former Smoker    Packs/day: 0.15    Years: 3.00    Types: Cigarettes  . Smokeless tobacco: Never Used     Comment: quit 2008  . Alcohol use 2.4 oz/week    2 Cans of beer, 2 Shots of liquor per week     Comment: drinks on weekends  . Drug use: No  . Sexual activity: Yes    Partners: Male    Birth control/ protection: Surgical   Other Topics Concern  . Not on file   Social History Narrative   Born in [place] and raised in [place]. Currently resides in a townhouse 3 children and boyfriend. No pets. Fun: ride a motorcycle.   Denies religious beliefs to effect health care.    Denies abuse and feels safe at home.    Drinks caffeine daily     Family History  Problem Relation Age of Onset  . Hypertension Mother   . Asthma Mother   . Hypertension Father   . Cancer Father     esophageal   . Hypertension Maternal Grandmother   . Diabetes Maternal Grandmother   . Cancer Maternal Grandfather   . Cancer Paternal Grandmother   . Hypertension Paternal Grandmother      Review of Systems  Constitutional: Positive for chills, fatigue and fever.  HENT: Negative for congestion, ear pain, nosebleeds, sinus pain and sore throat.   Eyes: Negative for blurred vision, double vision, discharge and redness.  Respiratory: Positive for cough. Negative for shortness of breath and wheezing.   Cardiovascular: Negative for chest pain and palpitations.  Gastrointestinal: Positive for anorexia. Negative for abdominal pain, nausea and vomiting.  Genitourinary: Negative for dysuria and hematuria.  Musculoskeletal: Positive for myalgias. Negative for neck pain.  Skin: Negative for rash.  Neurological: Positive for weakness and headaches. Negative for dizziness, vertigo, sensory change and focal weakness.  Endo/Heme/Allergies: Does not bruise/bleed easily.  All other systems reviewed and are negative.  Vitals:   10/22/16 1120  BP: (!) 148/78  Pulse: 86  Temp: 98.9 F (37.2 C)     Physical Exam  Constitutional: She is oriented to person, place, and time. She appears well-developed and well-nourished. She appears ill.  HENT:  Head: Normocephalic and atraumatic.  Nose: Nose normal.  Mouth/Throat: Oropharynx is clear and moist. No oropharyngeal exudate.  Eyes: Conjunctivae and EOM are normal. Pupils are equal, round, and reactive to light.  Neck: Normal range of motion. Neck supple. No JVD present. No thyromegaly present.  Cardiovascular: Normal rate, regular rhythm and normal heart sounds.   Pulmonary/Chest: Effort normal and breath sounds normal.  Abdominal: Soft. She exhibits no distension. There is no tenderness.  Musculoskeletal: Normal range of motion.  Lymphadenopathy:    She has no cervical adenopathy.  Neurological: She is alert and oriented to person, place, and time. No sensory deficit. She exhibits normal muscle tone.  Skin: Skin is warm and dry. Capillary refill takes less than 2 seconds.  Psychiatric: She has a normal mood and affect. Her behavior is normal.  Vitals reviewed.    ASSESSMENT & PLAN: Lorain ChildesCarteailya was seen today for headache, cough and generalized body aches.  Diagnoses and all orders for this visit:  Influenza with respiratory manifestation other than pneumonia  Generalized muscle ache  Nonintractable headache, unspecified chronicity pattern, unspecified headache type  Other orders -     oseltamivir (TAMIFLU) 75 MG capsule; Take 1 capsule (75 mg total) by mouth 2 (two) times daily. -     HYDROcodone-acetaminophen (NORCO) 5-325 MG tablet; Take 1 tablet by mouth every 6 (six) hours as needed for moderate pain.    Patient Instructions       IF you received an x-ray today, you will receive an invoice from [facility]. Please contact [facility] at [phone] with questions or concerns regarding your invoice.   IF you received labwork today, you will receive an invoice from [facility]. Please contact  [facility] at [phone] with questions or concerns regarding your invoice.   Our billing staff will not be able to assist you with questions regarding bills from these companies.  You will be contacted with the lab results as soon as they are available. The fastest way to get your results is to activate your My Chart account. Instructions are located on the last page of this paperwork. If you have not heard from us regarding the results in 2 weeks, please contact this office.      Influenza, Adult Influenza ("the flu") is an infection in the lungs, nose, and throat (respiratory tract). It is caused by a virus. The flu causes many common cold symptoms, as well as a high fever and body aches. It can make you feel very sick. The flu spreads easily from person to person (is contagious). Getting a flu shot (influenza vaccination) every year is the best way to prevent the flu. Follow these instructions at home:  Take over-the-counter and prescription medicines only as told by your doctor.  Use a cool mist humidifier to add moisture (humidity) to the air in your home. This can make it easier to breathe.  Rest as needed.  Drink enough fluid to keep your pee (urine) clear or pale yellow.  Cover your mouth and nose when you cough or sneeze.  Wash your hands with soap and water often, especially after you cough or sneeze. If you cannot use soap and water, use hand sanitizer.  Stay home from work or school as told by your doctor. Unless you are visiting your doctor, try to avoid leaving home until your fever has been gone for 24 hours without the use of medicine.  Keep all follow-up visits as told by your doctor. This is important. How is this prevented?  Getting a yearly (annual) flu shot is the best way to avoid getting the flu. You may get the flu shot in late summer, fall, or winter. Ask your doctor when you should get your flu shot.  Wash your hands often or use hand sanitizer  often.  Avoid contact with people who are sick during cold and flu season.  Eat healthy foods.  Drink plenty of fluids.  Get enough sleep.  Exercise regularly. Contact a doctor if:  You get new symptoms.  You have:  Chest pain.  Watery poop (diarrhea).  A fever.  Your cough gets worse.  You start to have more mucus.  You feel sick to your stomach (nauseous).  You throw up (vomit). Get help  right away if:  You start to be short of breath or have trouble breathing.  Your skin or nails turn a bluish color.  You have very bad pain or stiffness in your neck.  You get a sudden headache.  You get sudden pain in your face or ear.  You cannot stop throwing up. This information is not intended to replace advice given to you by your health care provider. Make sure you discuss any questions you have with your health care provider. Document Released: 05/12/2008 Document Revised: 01/09/2016 Document Reviewed: 05/28/2015 Elsevier Interactive Patient Education  2017 Elsevier Inc.      Agustina Caroli, MD [facility]

## 2016-10-23 ENCOUNTER — Ambulatory Visit: Payer: Self-pay | Admitting: Neurology

## 2016-10-26 ENCOUNTER — Encounter: Payer: Self-pay | Admitting: Family

## 2016-10-26 ENCOUNTER — Ambulatory Visit (INDEPENDENT_AMBULATORY_CARE_PROVIDER_SITE_OTHER): Payer: BLUE CROSS/BLUE SHIELD | Admitting: Family

## 2016-10-26 VITALS — BP 128/82 | HR 105 | Temp 98.9°F | Resp 16 | Ht 66.5 in | Wt 189.0 lb

## 2016-10-26 DIAGNOSIS — G43109 Migraine with aura, not intractable, without status migrainosus: Secondary | ICD-10-CM | POA: Diagnosis not present

## 2016-10-26 DIAGNOSIS — J0140 Acute pansinusitis, unspecified: Secondary | ICD-10-CM | POA: Diagnosis not present

## 2016-10-26 DIAGNOSIS — J329 Chronic sinusitis, unspecified: Secondary | ICD-10-CM | POA: Insufficient documentation

## 2016-10-26 MED ORDER — AMOXICILLIN-POT CLAVULANATE 875-125 MG PO TABS: 1.0000 | ORAL_TABLET | Freq: Two times a day (BID) | ORAL | 0 refills | Status: DC

## 2016-10-26 MED ORDER — PREDNISONE 10 MG (21) PO TBPK: ORAL_TABLET | ORAL | 0 refills | Status: DC

## 2016-10-26 MED ORDER — ZOLMITRIPTAN 5 MG PO TABS: ORAL_TABLET | ORAL | 0 refills | Status: DC

## 2016-10-26 NOTE — Patient Instructions (Addendum)
Thank you for choosing [facility].  SUMMARY AND INSTRUCTIONS:   We will try the Zomig for your headaches.  A new referral to neurology has been placed.   Start the Augmentin and prednisone as prescribed.  We will complete your FMLA paperwork.   Medication:  Your prescription(s) have been submitted to your pharmacy or been printed and provided for you. Please take as directed and contact our office if you believe you are having problem(s) with the medication(s) or have any questions.  Follow up:  If your symptoms worsen or fail to improve, please contact our office for further instruction, or in case of emergency go directly to the emergency room at the closest medical facility.    General Recommendations:    Please drink plenty of fluids.  Get plenty of rest   Sleep in humidified air  Use saline nasal sprays  Netti pot   OTC Medications:  Decongestants - helps relieve congestion   Flonase (generic fluticasone) or Nasacort (generic triamcinolone) - please make sure to use the "cross-over" technique at a 45 degree angle towards the opposite eye as opposed to straight up the nasal passageway.   Sudafed (generic pseudoephedrine - Note this is the one that is available behind the pharmacy counter); Products with phenylephrine (-PE) may also be used but is often not as effective as pseudoephedrine.   If you have HIGH BLOOD PRESSURE - Coricidin HBP; AVOID any product that is -D as this contains pseudoephedrine which may increase your blood pressure.  Afrin (oxymetazoline) every 6-8 hours for up to 3 days.   Allergies - helps relieve runny nose, itchy eyes and sneezing   Claritin (generic loratidine), Allegra (fexofenidine), or Zyrtec (generic cyrterizine) for runny nose. These medications should not cause drowsiness.  Note - Benadryl (generic diphenhydramine) may be used however may cause drowsiness  Cough -   Delsym or Robitussin (generic  dextromethorphan)  Expectorants - helps loosen mucus to ease removal   Mucinex (generic guaifenesin) as directed on the package.  Headaches / General Aches   Tylenol (generic acetaminophen) - DO NOT EXCEED 3 grams (3,000 mg) in a 24 hour time period  Advil/Motrin (generic ibuprofen)   Sore Throat -   Salt water gargle   Chloraseptic (generic benzocaine) spray or lozenges / Sucrets (generic dyclonine)    Sinusitis Sinusitis is redness, soreness, and inflammation of the paranasal sinuses. Paranasal sinuses are air pockets within the bones of your face (beneath the eyes, the middle of the forehead, or above the eyes). In healthy paranasal sinuses, mucus is able to drain out, and air is able to circulate through them by way of your nose. However, when your paranasal sinuses are inflamed, mucus and air can become trapped. This can allow bacteria and other germs to grow and cause infection. Sinusitis can develop quickly and last only a short time (acute) or continue over a long period (chronic). Sinusitis that lasts for more than 12 weeks is considered chronic.  CAUSES  Causes of sinusitis include:  Allergies.  Structural abnormalities, such as displacement of the cartilage that separates your nostrils (deviated septum), which can decrease the air flow through your nose and sinuses and affect sinus drainage.  Functional abnormalities, such as when the small hairs (cilia) that line your sinuses and help remove mucus do not work properly or are not present. SIGNS AND SYMPTOMS  Symptoms of acute and chronic sinusitis are the same. The primary symptoms are pain and pressure around the affected sinuses. Other symptoms  include:  Upper toothache.  Earache.  Headache.  Bad breath.  Decreased sense of smell and taste.  A cough, which worsens when you are lying flat.  Fatigue.  Fever.  Thick drainage from your nose, which often is green and may contain pus (purulent).  Swelling  and warmth over the affected sinuses. DIAGNOSIS  Your health care provider will perform a physical exam. During the exam, your health care provider may:  Look in your nose for signs of abnormal growths in your nostrils (nasal polyps).  Tap over the affected sinus to check for signs of infection.  View the inside of your sinuses (endoscopy) using an imaging device that has a light attached (endoscope). If your health care provider suspects that you have chronic sinusitis, one or more of the following tests may be recommended:  Allergy tests.  Nasal culture. A sample of mucus is taken from your nose, sent to a lab, and screened for bacteria.  Nasal cytology. A sample of mucus is taken from your nose and examined by your health care provider to determine if your sinusitis is related to an allergy. TREATMENT  Most cases of acute sinusitis are related to a viral infection and will resolve on their own within 10 days. Sometimes medicines are prescribed to help relieve symptoms (pain medicine, decongestants, nasal steroid sprays, or saline sprays).  However, for sinusitis related to a bacterial infection, your health care provider will prescribe antibiotic medicines. These are medicines that will help kill the bacteria causing the infection.  Rarely, sinusitis is caused by a fungal infection. In theses cases, your health care provider will prescribe antifungal medicine. For some cases of chronic sinusitis, surgery is needed. Generally, these are cases in which sinusitis recurs more than 3 times per year, despite other treatments. HOME CARE INSTRUCTIONS   Drink plenty of water. Water helps thin the mucus so your sinuses can drain more easily.  Use a humidifier.  Inhale steam 3 to 4 times a day (for example, sit in the bathroom with the shower running).  Apply a warm, moist washcloth to your face 3 to 4 times a day, or as directed by your health care provider.  Use saline nasal sprays to help  moisten and clean your sinuses.  Take medicines only as directed by your health care provider.  If you were prescribed either an antibiotic or antifungal medicine, finish it all even if you start to feel better. SEEK IMMEDIATE MEDICAL CARE IF:  You have increasing pain or severe headaches.  You have nausea, vomiting, or drowsiness.  You have swelling around your face.  You have vision problems.  You have a stiff neck.  You have difficulty breathing. MAKE SURE YOU:   Understand these instructions.  Will watch your condition.  Will get help right away if you are not doing well or get worse. Document Released: 08/03/2005 Document Revised: 12/18/2013 Document Reviewed: 08/18/2011 [facility] Patient Information 2015 [employer] [place]. This information is not intended to replace advice given to you by your health care provider. Make sure you discuss any questions you have with your health care provider.

## 2016-10-26 NOTE — Assessment & Plan Note (Signed)
Symptoms and exam are consistent with migraine headaches that have been refractory to several medications. Most recently going to try Zomig with prescription sent to pharmacy. Denies worst headache of life or neck pains. Mild symptoms today with otherwise normal neurological exam. Refer to new neurologist for further treatment.

## 2016-10-26 NOTE — Assessment & Plan Note (Signed)
Symptoms and exam consistent with acute pansinusitis most likely bacterial. Start Augmentin and prednisone taper. Continue over-the-counter medications as needed for symptom relief and supportive care. Follow-up if symptoms worsen or do not improve.

## 2016-10-26 NOTE — Progress Notes (Signed)
Subjective:    Patient ID: Rachel [name], female    DOB: 1986-01-21, 31 y.o.   MRN: [id number]  Chief Complaint  Patient presents with  . FMLA    talk about FMLA, dont feel well    HPI:  Rachel [name] is a 31 y.o. female who  has a past medical history of Abnormal Pap smear; Anemia; GERD (gastroesophageal reflux disease); Headache; migraines; Hypertension; Multiple gestation with malpresentation of one fetus or more (10/05/2012); Pregnancy induced hypertension; S/P cesarean section (10/06/2012); Shortness of breath; Transient hypertension of pregnancy, with delivery (10/19/2012); and 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 (10/05/2012). and presents today for an acute office visit.   1.) Sickness - This is a new problem. Associated symptoms of . Recently completed a course of Tamiflu prescribed by Urgent Care which she reports taking as prescribed and denies adverse side effects and has had no significant improvement. She was also prescribed hydrocodone-acetaminophen for pain which has not helped. Continues to experience the associated symptom of pain located on the right side of her head, face and teeth that has been going on for about 1 week. Denies fevers. Other modifying factors include warm baths that have helped with the body aches. Does continue to have significant amounts of drainage.  2.) Headaches - Continues to experience the associated symptoms of migraine headaches. Not currently maintained on medication. She was recently discharged from her neurologist secondary to multiple no-shows. Frequency of the headaches is about 3 times per week lasting approximately 1-2 days per episode. Previous treatment failure with atenolol, Topamax, Maxalt, and Imitrex. Denies worst headache of life or neck pain.   No Known Allergies    Outpatient Medications Prior to Visit  Medication Sig Dispense Refill  . Ibuprofen-Famotidine 800-26.6 MG TABS Take 1 tablet by mouth 3 (three)  times daily as needed. 90 tablet 1  . oseltamivir (TAMIFLU) 75 MG capsule Take 1 capsule (75 mg total) by mouth 2 (two) times daily. 10 capsule 0  . HYDROcodone-acetaminophen (NORCO) 5-325 MG tablet Take 1 tablet by mouth every 6 (six) hours as needed for moderate pain. 15 tablet 0  . atenolol (TENORMIN) 50 MG tablet Take 1 tablet (50 mg total) by mouth daily. (Patient not taking: Reported on 10/22/2016) 30 tablet 2  . sertraline (ZOLOFT) 25 MG tablet Take 25 mg daily for one week (Patient not taking: Reported on 10/22/2016) 7 tablet 0  . sertraline (ZOLOFT) 50 MG tablet Take 25 mg daily for one week, then 50 mg daily for one week, then 100 mg daily (Patient not taking: Reported on 10/22/2016) 60 tablet 1   No facility-administered medications prior to visit.       Past Surgical History:  Procedure Laterality Date  . BILATERAL SALPINGECTOMY Bilateral 10/06/2012   Procedure: BILATERAL SALPINGECTOMY;  Surgeon: Sherron Monday, MD;  Location: [facility];  Service: Gynecology;  Laterality: Bilateral;  . CARPAL TUNNEL RELEASE Bilateral 04/12/2014   Procedure: RIGHT CARPAL TUNNEL RELEASE, LEFT CARPAL TUNNEL RELEASE;  Surgeon: Cindee Salt, MD;  Location: [facility];  Service: Orthopedics;  Laterality: Bilateral;  . CESAREAN SECTION N/A 10/06/2012   Procedure: CESAREAN SECTION;  Surgeon: Sherron Monday, MD;  Location: [facility];  Service: Obstetrics;  Laterality: N/A;  PRIMARY C/S-TWINS  . TUBAL LIGATION    . WISDOM TOOTH EXTRACTION        Past Medical History:  Diagnosis Date  . Abnormal Pap smear   . Anemia    1st pregnancy  .  GERD (gastroesophageal reflux disease)    pregnancy related  . Headache   . Hx of migraines   . Hypertension   . Multiple gestation with malpresentation of one fetus or more 10/05/2012  . Pregnancy induced hypertension   . S/P cesarean section 10/06/2012  . Shortness of breath    with bronchitis  . Transient hypertension of pregnancy, with delivery 10/19/2012  . Twin  pregnancy with two placentas and two amniotic sacs 10/05/2012      Review of Systems  Constitutional: Positive for fatigue. Negative for chills and fever.  HENT: Positive for congestion, ear pain, sinus pain and sinus pressure. Negative for sneezing and sore throat.   Eyes: Negative for visual disturbance.  Respiratory: Negative for chest tightness and shortness of breath.   Cardiovascular: Negative for chest pain and leg swelling.  Neurological: Positive for headaches.      Objective:    BP 128/82 (BP Location: Left Arm, Patient Position: Sitting, Cuff Size: Large)   Pulse (!) 105   Temp 98.9 F (37.2 C) (Oral)   Resp 16   Ht 5' 6.5" (1.689 m)   Wt 189 lb (85.7 kg)   LMP 10/05/2016 (Approximate)   SpO2 98%   BMI 30.05 kg/m  Nursing note and vital signs reviewed.  Physical Exam  Constitutional: She is oriented to person, place, and time. She appears well-developed and well-nourished. No distress.  HENT:  Right Ear: Hearing, tympanic membrane, external ear and ear canal normal.  Left Ear: Hearing, tympanic membrane, external ear and ear canal normal.  Nose: Right sinus exhibits maxillary sinus tenderness and frontal sinus tenderness. Left sinus exhibits maxillary sinus tenderness and frontal sinus tenderness.  Mouth/Throat: Uvula is midline, oropharynx is clear and moist and mucous membranes are normal.  Eyes: Conjunctivae and EOM are normal. Pupils are equal, round, and reactive to light.  Neck: Neck supple.  Cardiovascular: Normal rate, regular rhythm, normal heart sounds and intact distal pulses.   Pulmonary/Chest: Effort normal and breath sounds normal.  Neurological: She is alert and oriented to person, place, and time. No cranial nerve deficit.  Skin: Skin is warm and dry.  Psychiatric: She has a normal mood and affect. Her behavior is normal. Judgment and thought content normal.       Assessment & Plan:   Problem List Items Addressed This Visit       Cardiovascular and Mediastinum   Migraine with aura and without status migrainosus, not intractable - Primary    Symptoms and exam are consistent with migraine headaches that have been refractory to several medications. Most recently going to try Zomig with prescription sent to pharmacy. Denies worst headache of life or neck pains. Mild symptoms today with otherwise normal neurological exam. Refer to new neurologist for further treatment.       Relevant Medications   zolmitriptan (ZOMIG) 5 MG tablet     Respiratory   Sinusitis    Symptoms and exam consistent with acute pansinusitis most likely bacterial. Start Augmentin and prednisone taper. Continue over-the-counter medications as needed for symptom relief and supportive care. Follow-up if symptoms worsen or do not improve.      Relevant Medications   amoxicillin-clavulanate (AUGMENTIN) 875-125 MG tablet   predniSONE (STERAPRED UNI-PAK 21 TAB) 10 MG (21) TBPK tablet       I have discontinued Ms. Gondek's sertraline, sertraline, and HYDROcodone-acetaminophen. I am also having her start on zolmitriptan, amoxicillin-clavulanate, and predniSONE. Additionally, I am having her maintain her atenolol, Ibuprofen-Famotidine, and oseltamivir.  Meds ordered this encounter  Medications  . zolmitriptan (ZOMIG) 5 MG tablet    Sig: Take 1 tablet by mouth at the onset of a headache and may repeat in 2 hours if symptoms do not improve.    Dispense:  10 tablet    Refill:  0    Order Specific Question:   Supervising Provider    Answer:   Hillard DankerRAWFORD, ELIZABETH A [[id number]]  . amoxicillin-clavulanate (AUGMENTIN) 875-125 MG tablet    Sig: Take 1 tablet by mouth 2 (two) times daily.    Dispense:  14 tablet    Refill:  0    Order Specific Question:   Supervising Provider    Answer:   Hillard DankerRAWFORD, ELIZABETH A [[id number]]  . predniSONE (STERAPRED UNI-PAK 21 TAB) 10 MG (21) TBPK tablet    Sig: Take 6 tablets x 1 day, 5 tablets x 1 day, 4 tablets x 1 day, 3 tablets x 1  day, 2 tablets x 1 day, 1 tablet x 1 day    Dispense:  21 tablet    Refill:  0    Order Specific Question:   Supervising Provider    Answer:   Hillard DankerRAWFORD, ELIZABETH A [[id number]]     Follow-up: Return in about 1 month (around 11/26/2016), or if symptoms worsen or fail to improve.  Jeanine Luzalone, Sherilee Smotherman, FNP

## 2016-10-28 ENCOUNTER — Telehealth: Payer: Self-pay

## 2016-10-28 NOTE — Telephone Encounter (Signed)
FMLA paper work has been done for pt and faxed to the appropriate number listed on forms.

## 2016-11-02 ENCOUNTER — Ambulatory Visit (HOSPITAL_COMMUNITY): Payer: Self-pay | Admitting: Psychiatry

## 2016-11-03 NOTE — Telephone Encounter (Signed)
#  6 on form needs to be filled out on her FMLA - has until Monday that needs to be turned in.  Needs to be faxed back. Please give pt a call

## 2016-11-04 NOTE — Telephone Encounter (Signed)
FMLA paperwork updated and faxed on yesterday 11/03/16.

## 2016-11-11 DIAGNOSIS — Z0289 Encounter for other administrative examinations: Secondary | ICD-10-CM

## 2016-11-19 ENCOUNTER — Telehealth: Payer: Self-pay | Admitting: Family

## 2016-11-19 NOTE — Telephone Encounter (Signed)
Pt aware and has an appointment to discuss

## 2016-11-19 NOTE — Telephone Encounter (Signed)
Patient will need office visit to discuss. Xanax is not given for migraine headaches.

## 2016-11-19 NOTE — Telephone Encounter (Signed)
Pt wants to be prescribed Xanax for headaches. Please advise.

## 2016-11-25 ENCOUNTER — Ambulatory Visit (INDEPENDENT_AMBULATORY_CARE_PROVIDER_SITE_OTHER): Payer: BLUE CROSS/BLUE SHIELD | Admitting: Family

## 2016-11-25 ENCOUNTER — Encounter: Payer: Self-pay | Admitting: Family

## 2016-11-25 VITALS — BP 132/86 | HR 86 | Temp 98.1°F | Resp 16 | Ht 66.5 in | Wt 190.0 lb

## 2016-11-25 DIAGNOSIS — G43109 Migraine with aura, not intractable, without status migrainosus: Secondary | ICD-10-CM | POA: Diagnosis not present

## 2016-11-25 MED ORDER — DIAZEPAM 5 MG PO TABS: 5.0000 mg | ORAL_TABLET | Freq: Two times a day (BID) | ORAL | 0 refills | Status: DC | PRN

## 2016-11-25 NOTE — Assessment & Plan Note (Signed)
Migraine headaches remain labile with current medication regimen and no adverse side effects and refractory to several medications with no significant improvements. Start Valium. Refer to neurology for further assessment and treatment. Denies worse headache of life with mild current headache. Continue Duexis for the mild headaches as needed. Continue to monitor.

## 2016-11-25 NOTE — Patient Instructions (Signed)
Thank you for choosing [facility].  SUMMARY AND INSTRUCTIONS:  Start the valium as needed for headaches.   They will call to schedule your appointment with neurology.  Seek emergency care if the worst headache of your life develops.   Medication:  Your prescription(s) have been submitted to your pharmacy or been printed and provided for you. Please take as directed and contact our office if you believe you are having problem(s) with the medication(s) or have any questions.  Follow up:  If your symptoms worsen or fail to improve, please contact our office for further instruction, or in case of emergency go directly to the emergency room at the closest medical facility.

## 2016-11-25 NOTE — Progress Notes (Signed)
Subjective:    Patient ID: Rachel [name], female    DOB: January 28, 1986, 31 y.o.   MRN: [id number]  Chief Complaint  Patient presents with  . Medication Management    talk about options for headaches, swollen throat    HPI:  Rachel [name] is a 31 y.o. female who  has a past medical history of Abnormal Pap smear; Anemia; GERD (gastroesophageal reflux disease); Headache; migraines; Hypertension; Multiple gestation with malpresentation of one fetus or more (10/05/2012); Pregnancy induced hypertension; S/P cesarean section (10/06/2012); Shortness of breath; Transient hypertension of pregnancy, with delivery (10/19/2012); and 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 (10/05/2012). and presents today for a follow up office visit.  1.) Migraines - Currently maintained on Zomig. Reports taken medications prescribed and denies adverse side effects. Notes that it does not help very much with her symptoms. She has already had treatment failure with Maxalt and Immitrex. Frequency of the headaches are generally 2x times per week with the incapacitating headaches once per week. She has been also been on Topamax, Gabapentin, atenolol and propranolol. Notes previous Duexis does not help with the bad headaches but helps with the minor headaches.    No Known Allergies    Outpatient Medications Prior to Visit  Medication Sig Dispense Refill  . atenolol (TENORMIN) 50 MG tablet Take 1 tablet (50 mg total) by mouth daily. 30 tablet 2  . Ibuprofen-Famotidine 800-26.6 MG TABS Take 1 tablet by mouth 3 (three) times daily as needed. 90 tablet 1  . zolmitriptan (ZOMIG) 5 MG tablet Take 1 tablet by mouth at the onset of a headache and may repeat in 2 hours if symptoms do not improve. 10 tablet 0  . amoxicillin-clavulanate (AUGMENTIN) 875-125 MG tablet Take 1 tablet by mouth 2 (two) times daily. 14 tablet 0  . predniSONE (STERAPRED UNI-PAK 21 TAB) 10 MG (21) TBPK tablet Take 6 tablets x 1 day, 5 tablets  x 1 day, 4 tablets x 1 day, 3 tablets x 1 day, 2 tablets x 1 day, 1 tablet x 1 day 21 tablet 0   No facility-administered medications prior to visit.     Review of Systems  Constitutional: Negative for chills and fever.  Eyes: Negative for visual disturbance.  Respiratory: Negative for chest tightness and shortness of breath.   Cardiovascular: Negative for chest pain.  Neurological: Positive for headaches. Negative for dizziness, syncope, speech difficulty, weakness, light-headedness and numbness.      Objective:    BP 132/86 (BP Location: Right Arm, Patient Position: Sitting, Cuff Size: Large)   Pulse 86   Temp 98.1 F (36.7 C) (Oral)   Resp 16   Ht 5' 6.5" (1.689 m)   Wt 190 lb (86.2 kg)   SpO2 98%   BMI 30.21 kg/m  Nursing note and vital signs reviewed.  Physical Exam  Constitutional: She is oriented to person, place, and time. She appears well-developed and well-nourished. No distress.  HENT:  Right Ear: Hearing, tympanic membrane, external ear and ear canal normal.  Left Ear: Hearing, tympanic membrane, external ear and ear canal normal.  Nose: Nose normal.  Mouth/Throat: Uvula is midline, oropharynx is clear and moist and mucous membranes are normal.  Eyes: Conjunctivae and EOM are normal. Pupils are equal, round, and reactive to light.  Cardiovascular: Normal rate, regular rhythm, normal heart sounds and intact distal pulses.  Exam reveals no gallop and no friction rub.   No murmur heard. Pulmonary/Chest: Effort normal and breath sounds  normal.  Neurological: She is alert and oriented to person, place, and time. She has normal reflexes. No cranial nerve deficit.  Skin: Skin is warm and dry.  Psychiatric: She has a normal mood and affect. Her behavior is normal. Judgment and thought content normal.       Assessment & Plan:   Problem List Items Addressed This Visit      Cardiovascular and Mediastinum   Migraine with aura and without status migrainosus, not  intractable - Primary    Migraine headaches remain labile with current medication regimen and no adverse side effects and refractory to several medications with no significant improvements. Start Valium. Refer to neurology for further assessment and treatment. Denies worse headache of life with mild current headache. Continue Duexis for the mild headaches as needed. Continue to monitor.       Relevant Medications   diazepam (VALIUM) 5 MG tablet   Other Relevant Orders   Ambulatory referral to Neurology       I have discontinued Ms. Dubs's amoxicillin-clavulanate and predniSONE. I am also having her start on diazepam. Additionally, I am having her maintain her atenolol, Ibuprofen-Famotidine, and zolmitriptan.   Meds ordered this encounter  Medications  . diazepam (VALIUM) 5 MG tablet    Sig: Take 1 tablet (5 mg total) by mouth every 12 (twelve) hours as needed for muscle spasms. Or headaches    Dispense:  30 tablet    Refill:  0    Order Specific Question:   Supervising Provider    Answer:   Hillard Danker A [[id number]]     Follow-up: Return in about 3 months (around 02/24/2017), or if symptoms worsen or fail to improve.  Rachel Luz, FNP

## 2016-12-03 ENCOUNTER — Ambulatory Visit: Payer: Self-pay | Admitting: Neurology

## 2016-12-08 ENCOUNTER — Encounter: Payer: Self-pay | Admitting: Family

## 2017-01-08 ENCOUNTER — Ambulatory Visit (INDEPENDENT_AMBULATORY_CARE_PROVIDER_SITE_OTHER): Payer: BLUE CROSS/BLUE SHIELD | Admitting: Family Medicine

## 2017-01-08 ENCOUNTER — Encounter: Payer: Self-pay | Admitting: Family Medicine

## 2017-01-08 ENCOUNTER — Ambulatory Visit: Payer: BLUE CROSS/BLUE SHIELD | Admitting: Nurse Practitioner

## 2017-01-08 VITALS — BP 130/90 | HR 88 | Temp 98.4°F | Ht 66.5 in | Wt 187.3 lb

## 2017-01-08 DIAGNOSIS — I10 Essential (primary) hypertension: Secondary | ICD-10-CM | POA: Diagnosis not present

## 2017-01-08 MED ORDER — HYDROCHLOROTHIAZIDE 12.5 MG PO CAPS: 12.5000 mg | ORAL_CAPSULE | Freq: Every day | ORAL | 1 refills | Status: DC

## 2017-01-08 NOTE — Patient Instructions (Addendum)
BEFORE YOU LEAVE: -lab -follow up: in one month with PCP  Start the hydrochlorothiazide 12.5 mg once daily for the blood pressure  Advise regular aerobic exercise (at least 150 minutes per week of sweaty exercise) and a healthy diet. Try to eat at least 5-9 servings of vegetables and fruits per day (not corn, potatoes or bananas.) Avoid sweets, red meat, pork, butter, fried foods, fast food, processed food, excessive dairy, eggs and coconut. Replace bad fats with good fats - fish, nuts and seeds, canola oil, olive oil.   I hope you are feeling better soon! Seek care immediately if worsening, new concerns or you are not improving with treatment.

## 2017-01-08 NOTE — Progress Notes (Addendum)
HPI:  Acute visit for Hypertension: -reports on atenolol in the past but stopped awhile ago because " I am bad about taking medication" and "did not feel it worked" -reports also on propranolol for BP and headaches and also did not feel was worth it -has noticed at [facility] and psych visits the last few days that BP is elevate din 140s to 90s range -she is very worried about this -mother and father with HTN -denies: CP, SOB, DOE, vision changes, palpitations, swelling -lots of anxiety and hx migraines  ROS: See pertinent positives and negatives per HPI.  Past Medical History:  Diagnosis Date  . Abnormal Pap smear   . Anemia    1st pregnancy  . GERD (gastroesophageal reflux disease)    pregnancy related  . Headache   . Hx of migraines   . Hypertension   . Multiple gestation with malpresentation of one fetus or more 10/05/2012  . Pregnancy induced hypertension   . S/P cesarean section 10/06/2012  . Shortness of breath    with bronchitis  . Transient hypertension of pregnancy, with delivery 10/19/2012  . 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 10/05/2012    Past Surgical History:  Procedure Laterality Date  . BILATERAL SALPINGECTOMY Bilateral 10/06/2012   Procedure: BILATERAL SALPINGECTOMY;  Surgeon: Sherron Monday, MD;  Location: [facility];  Service: Gynecology;  Laterality: Bilateral;  . CARPAL TUNNEL RELEASE Bilateral 04/12/2014   Procedure: RIGHT CARPAL TUNNEL RELEASE, LEFT CARPAL TUNNEL RELEASE;  Surgeon: Cindee Salt, MD;  Location: [facility];  Service: Orthopedics;  Laterality: Bilateral;  . CESAREAN SECTION N/A 10/06/2012   Procedure: CESAREAN SECTION;  Surgeon: Sherron Monday, MD;  Location: [facility];  Service: Obstetrics;  Laterality: N/A;  PRIMARY C/S-TWINS  . TUBAL LIGATION    . WISDOM TOOTH EXTRACTION      Family History  Problem Relation Age of Onset  . Hypertension Mother   . Asthma Mother   . Hypertension Father   . Cancer Father    esophageal   . Hypertension Maternal Grandmother   . Diabetes Maternal Grandmother   . Cancer Maternal Grandfather   . Cancer Paternal Grandmother   . Hypertension Paternal Grandmother     Social History   Social History  . Marital status: Single    Spouse name: N/A  . Number of children: 3  . Years of education: 14   Occupational History  . Production operator    Social History Main Topics  . Smoking status: Former Smoker    Packs/day: 0.15    Years: 3.00    Types: Cigarettes  . Smokeless tobacco: Never Used     Comment: quit 2008  . Alcohol use 2.4 oz/week    2 Cans of beer, 2 Shots of liquor per week     Comment: drinks on weekends  . Drug use: No  . Sexual activity: Yes    Partners: Male    Birth control/ protection: Surgical   Other Topics Concern  . None   Social History Narrative   Born in [place] and raised in [place]. Currently resides in a townhouse 3 children and boyfriend. No pets. Fun: ride a motorcycle.   Denies religious beliefs to effect health care.    Denies abuse and feels safe at home.    Drinks caffeine daily      Current Outpatient Prescriptions:  .  diazepam (VALIUM) 5 MG tablet, Take 1 tablet (5 mg total) by mouth every 12 (twelve) hours  as needed for muscle spasms. Or headaches, Disp: 30 tablet, Rfl: 0 .  Ibuprofen-Famotidine 800-26.6 MG TABS, Take 1 tablet by mouth 3 (three) times daily as needed., Disp: 90 tablet, Rfl: 1 .  zolmitriptan (ZOMIG) 5 MG tablet, Take 1 tablet by mouth at the onset of a headache and may repeat in 2 hours if symptoms do not improve., Disp: 10 tablet, Rfl: 0 .  hydrochlorothiazide (MICROZIDE) 12.5 MG capsule, Take 1 capsule (12.5 mg total) by mouth daily., Disp: 30 capsule, Rfl: 1  EXAM:  Vitals:   01/08/17 1057 01/08/17 1100  BP: 122/90 130/90  Pulse: 88   Temp: 98.4 F (36.9 C)     Body mass index is 29.78 kg/m.  GENERAL: vitals reviewed and listed above, alert, oriented, appears well hydrated  and in no acute distress  HEENT: atraumatic, conjunttiva clear, no obvious abnormalities on inspection of external nose and ears  NECK: no obvious masses on inspection  LUNGS: clear to auscultation bilaterally, no wheezes, rales or rhonchi, good air movement  CV: HRRR, no peripheral edema  MS: moves all extremities without noticeable abnormality  PSYCH: pleasant and cooperative, no obvious depression or anxiety  ASSESSMENT AND PLAN:  Discussed the following assessment and plan:  Hypertension, unspecified type - Plan: Basic metabolic panel  -DBP high, she is very concerned and wants to start antihypertensive -discussed various options and risks and she prefers diuretic/hctz -also advised healthy diet and regular aerobic exercise -will check BMP today, rx sent, advised 1 month follow up with PCP -all concerns and questions addressed -Patient advised to return or notify a doctor immediately if symptoms worsen or persist or new concerns arise.  Patient Instructions  BEFORE YOU LEAVE: -lab -follow up: in one month with PCP  Start the hydrochlorothiazide 12.5 mg once daily for the blood pressure  Advise regular aerobic exercise (at least 150 minutes per week of sweaty exercise) and a healthy diet. Try to eat at least 5-9 servings of vegetables and fruits per day (not corn, potatoes or bananas.) Avoid sweets, red meat, pork, butter, fried foods, fast food, processed food, excessive dairy, eggs and coconut. Replace bad fats with good fats - fish, nuts and seeds, canola oil, olive oil.   I hope you are feeling better soon! Seek care immediately if worsening, new concerns or you are not improving with treatment.        Kriste BasqueKIM, HANNAH R., DO

## 2017-01-12 ENCOUNTER — Ambulatory Visit: Payer: BLUE CROSS/BLUE SHIELD | Admitting: Family

## 2017-04-22 ENCOUNTER — Ambulatory Visit: Payer: Self-pay | Admitting: Family

## 2017-11-02 ENCOUNTER — Emergency Department (HOSPITAL_COMMUNITY)
Admission: EM | Admit: 2017-11-02 | Discharge: 2017-11-02 | Disposition: A | Discharge status: Home / Self Care | Payer: Medicaid Other | Attending: Emergency Medicine | Admitting: Emergency Medicine

## 2017-11-02 ENCOUNTER — Emergency Department (HOSPITAL_COMMUNITY): Payer: Medicaid Other

## 2017-11-02 ENCOUNTER — Other Ambulatory Visit: Payer: Self-pay

## 2017-11-02 ENCOUNTER — Encounter (HOSPITAL_COMMUNITY): Payer: Self-pay | Admitting: Emergency Medicine

## 2017-11-02 DIAGNOSIS — Z87891 Personal history of nicotine dependence: Secondary | ICD-10-CM | POA: Insufficient documentation

## 2017-11-02 DIAGNOSIS — R110 Nausea: Secondary | ICD-10-CM | POA: Insufficient documentation

## 2017-11-02 DIAGNOSIS — R1031 Right lower quadrant pain: Secondary | ICD-10-CM | POA: Insufficient documentation

## 2017-11-02 DIAGNOSIS — I10 Essential (primary) hypertension: Secondary | ICD-10-CM | POA: Insufficient documentation

## 2017-11-02 DIAGNOSIS — Z79899 Other long term (current) drug therapy: Secondary | ICD-10-CM | POA: Insufficient documentation

## 2017-11-02 DIAGNOSIS — R1032 Left lower quadrant pain: Secondary | ICD-10-CM | POA: Insufficient documentation

## 2017-11-02 DIAGNOSIS — R0789 Other chest pain: Secondary | ICD-10-CM | POA: Insufficient documentation

## 2017-11-02 LAB — CBC
HCT: 36.5 % (ref 36.0–46.0)
Hemoglobin: 11.9 g/dL — ABNORMAL LOW (ref 12.0–15.0)
MCH: 28 pg (ref 26.0–34.0)
MCHC: 32.6 g/dL (ref 30.0–36.0)
MCV: 85.9 fL (ref 78.0–100.0)
PLATELETS: 225 10*3/uL (ref 150–400)
RBC: 4.25 MIL/uL (ref 3.87–5.11)
RDW: 14 % (ref 11.5–15.5)
WBC: 5.9 10*3/uL (ref 4.0–10.5)

## 2017-11-02 LAB — COMPREHENSIVE METABOLIC PANEL
ALK PHOS: 47 U/L (ref 38–126)
ALT: 10 U/L — ABNORMAL LOW (ref 14–54)
ANION GAP: 4 — AB (ref 5–15)
AST: 13 U/L — ABNORMAL LOW (ref 15–41)
Albumin: 3.8 g/dL (ref 3.5–5.0)
BILIRUBIN TOTAL: 0.2 mg/dL — AB (ref 0.3–1.2)
BUN: 9 mg/dL (ref 6–20)
CALCIUM: 8.9 mg/dL (ref 8.9–10.3)
CO2: 28 mmol/L (ref 22–32)
Chloride: 106 mmol/L (ref 101–111)
Creatinine, Ser: 0.8 mg/dL (ref 0.44–1.00)
GFR calc Af Amer: >60 mL/min (ref >60)
GLUCOSE: 90 mg/dL (ref 65–99)
POTASSIUM: 3.7 mmol/L (ref 3.5–5.1)
Sodium: 138 mmol/L (ref 135–145)
TOTAL PROTEIN: 6.9 g/dL (ref 6.5–8.1)

## 2017-11-02 LAB — I-STAT TROPONIN, ED: TROPONIN I, POC: 0 ng/mL (ref 0.00–0.08)

## 2017-11-02 LAB — LIPASE, BLOOD: Lipase: 26 U/L (ref 11–51)

## 2017-11-02 MED ORDER — RANITIDINE HCL 150 MG PO CAPS: 150.0000 mg | ORAL_CAPSULE | Freq: Every day | ORAL | 0 refills | Status: DC

## 2017-11-02 MED ORDER — ONDANSETRON HCL 4 MG/2ML IJ SOLN
4.0000 mg | Freq: Once | INTRAMUSCULAR | Status: AC
  Administered 2017-11-02: 4 mg via INTRAVENOUS
  Filled 2017-11-02: qty 2

## 2017-11-02 MED ORDER — POLYETHYLENE GLYCOL 3350 17 GM/SCOOP PO POWD: 1.0000 | Freq: Once | ORAL | 0 refills | Status: AC

## 2017-11-02 MED ORDER — KETOROLAC TROMETHAMINE 15 MG/ML IJ SOLN
15.0000 mg | Freq: Once | INTRAMUSCULAR | Status: AC
  Administered 2017-11-02: 15 mg via INTRAVENOUS
  Filled 2017-11-02: qty 1

## 2017-11-02 MED ORDER — GI COCKTAIL ~~LOC~~
30.0000 mL | Freq: Once | ORAL | Status: AC
  Administered 2017-11-02: 30 mL via ORAL
  Filled 2017-11-02: qty 30

## 2017-11-02 NOTE — ED Provider Notes (Signed)
[facility]-EMERGENCY DEPT Provider Note   CSN: [id number] Arrival date & time: 11/02/17  1329     History   Chief Complaint Chief Complaint  Patient presents with  . Abdominal Pain  . Chest Pain    HPI Rachel [name] is a 32 y.o. female presenting for evaluation of chest pain and abdominal pain.  Pt states that her chest pain began last night.  She describes it as spasms in the center of her chest, worse when she is standing improved when she is laying.  No increase with exertion or inspiration.  She denies history of similar.  She denies history of cardiac problems.  She denies associated nausea, vomiting, shortness of breath, or diaphoresis. No association with PO intake. No fam h/o cardiac disease, she is a non-smoker without history of hypertension diabetes.  She denies recent travel, surgery, immobilization, trauma, hormone use, history of cancer, or history of DVT/PE.  Additionally, patient reporting abdominal pain.  She describes this as a cramping feeling of her bilateral abdomen.  This is been going on for several months, but worsened recently.  Pain is worse with movement, sitting makes it better.  She has not tried any Tylenol or ibuprofen.  She reports intermittent nausea, no vomiting.  She states she goes several days between having a bowel movement, and has to strain a lot.  She does not feel like she is evacuating completely.  She denies urinary symptoms.  She denies vaginal discharge.  She denies fevers, chills, cough.  She has no other medical problems, does not take medications daily.  She was evaluated at urgent care and sent to the ER for abnormal EKG.   HPI  Past Medical History:  Diagnosis Date  . Abnormal Pap smear   . Anemia    1st pregnancy  . GERD (gastroesophageal reflux disease)    pregnancy related  . Headache   . Hx of migraines   . Hypertension   . Multiple gestation with malpresentation of one fetus or more 10/05/2012  .  Pregnancy induced hypertension   . S/P cesarean section 10/06/2012  . Shortness of breath    with bronchitis  . Transient hypertension of pregnancy, with delivery 10/19/2012  . 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 10/05/2012    Patient Active Problem List   Diagnosis Date Noted  . Sinusitis 10/26/2016  . Influenza with respiratory manifestation other than pneumonia 10/22/2016  . Major depressive disorder, recurrent episode, moderate (HCC) 07/08/2016  . Shift work sleep disorder 04/03/2016  . Routine general medical examination at a health care facility 08/06/2015  . Severe depression (HCC) 01/09/2015  . Migraine with aura and without status migrainosus, not intractable 12/04/2014  . Migraine 09/14/2014  . Essential hypertension 09/14/2014  . Transient hypertension of pregnancy, with delivery 10/19/2012  . S/P cesarean section 10/06/2012  . 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 10/05/2012  . Multiple gestation with malpresentation of one fetus or more 10/05/2012    Past Surgical History:  Procedure Laterality Date  . BILATERAL SALPINGECTOMY Bilateral 10/06/2012   Procedure: BILATERAL SALPINGECTOMY;  Surgeon: Sherron Monday, MD;  Location: [facility];  Service: Gynecology;  Laterality: Bilateral;  . CARPAL TUNNEL RELEASE Bilateral 04/12/2014   Procedure: RIGHT CARPAL TUNNEL RELEASE, LEFT CARPAL TUNNEL RELEASE;  Surgeon: Cindee Salt, MD;  Location: [facility];  Service: Orthopedics;  Laterality: Bilateral;  . CESAREAN SECTION N/A 10/06/2012   Procedure: CESAREAN SECTION;  Surgeon: Sherron Monday,  MD;  Location: [facility];  Service: Obstetrics;  Laterality: N/A;  PRIMARY C/S-TWINS  . TUBAL LIGATION    . WISDOM TOOTH EXTRACTION      OB History    Gravida Para Term Preterm AB Living   2 2 2     3    SAB TAB Ectopic Multiple Live Births         1 2       Home Medications    Prior to Admission medications   Medication Sig Start Date End Date  Taking? Authorizing Provider  acetaminophen (TYLENOL) 500 MG tablet Take 1,000 mg by mouth daily as needed (CHEST PAIN, CRAMPS).   Yes [provider]  Aspirin-Acetaminophen-Caffeine (EXCEDRIN PO) Take 1 tablet by mouth daily as needed (chest pain).   Yes [provider]  Multiple Vitamin (MULTIVITAMIN WITH MINERALS) TABS tablet Take 2 tablets by mouth daily.   Yes [provider]  naproxen sodium (ALEVE) 220 MG tablet Take 440 mg by mouth daily as needed (chest pain).   Yes [provider]  diazepam (VALIUM) 5 MG tablet Take 1 tablet (5 mg total) by mouth every 12 (twelve) hours as needed for muscle spasms. Or headaches Patient not taking: Reported on 11/02/2017 11/25/16   Veryl Speakalone, Gregory D, FNP  hydrochlorothiazide (MICROZIDE) 12.5 MG capsule Take 1 capsule (12.5 mg total) by mouth daily. Patient not taking: Reported on 11/02/2017 01/08/17   Terressa KoyanagiKim, Hannah R, DO  Ibuprofen-Famotidine 800-26.6 MG TABS Take 1 tablet by mouth 3 (three) times daily as needed. Patient not taking: Reported on 11/02/2017 08/06/16   Veryl Speakalone, Gregory D, FNP  polyethylene glycol powder (GLYCOLAX/MIRALAX) powder Take 255 g by mouth once for 1 dose. 11/02/17 11/02/17  Jermell Holeman, PA-C  ranitidine (ZANTAC) 150 MG capsule Take 1 capsule (150 mg total) by mouth daily. 11/02/17   Teneka Malmberg, PA-C  zolmitriptan (ZOMIG) 5 MG tablet Take 1 tablet by mouth at the onset of a headache and may repeat in 2 hours if symptoms do not improve. Patient not taking: Reported on 11/02/2017 10/26/16   Veryl Speakalone, Gregory D, FNP    Family History Family History  Problem Relation Age of Onset  . Hypertension Mother   . Asthma Mother   . Hypertension Father   . Cancer Father        esophageal   . Hypertension Maternal Grandmother   . Diabetes Maternal Grandmother   . Cancer Maternal Grandfather   . Cancer Paternal Grandmother   . Hypertension Paternal Grandmother     Social History Social History    Tobacco Use  . Smoking status: Former Smoker    Packs/day: 0.15    Years: 3.00    Pack years: 0.45    Types: Cigarettes  . Smokeless tobacco: Never Used  . Tobacco comment: quit 2008  Substance Use Topics  . Alcohol use: Yes    Alcohol/week: 2.4 oz    Types: 2 Cans of beer, 2 Shots of liquor per week    Comment: drinks on weekends  . Drug use: No     Allergies   Patient has no known allergies.   Review of Systems Review of Systems  Cardiovascular: Positive for chest pain.  Gastrointestinal: Positive for abdominal pain and nausea.  All other systems reviewed and are negative.    Physical Exam Updated Vital Signs BP (!) 157/99   Pulse (!) 55   Temp 98.8 F (37.1 C) (Oral)   Resp 13   Ht 5\' 6"  (1.676 m)  LMP 10/15/2017   SpO2 100%   BMI 30.23 kg/m   Physical Exam  Constitutional: She is oriented to person, place, and time. She appears well-developed and well-nourished. No distress.  HENT:  Head: Normocephalic and atraumatic.  Eyes: Conjunctivae and EOM are normal. Pupils are equal, round, and reactive to light.  Neck: Normal range of motion. Neck supple.  Cardiovascular: Normal rate, regular rhythm and intact distal pulses.  Pulmonary/Chest: Effort normal and breath sounds normal. No respiratory distress. She has no wheezes. She exhibits no tenderness.  Patient speaking full sentences without difficulty.  Clear lung sounds in all fields.  Abdominal: Soft. Bowel sounds are normal. She exhibits no distension and no mass. There is tenderness. There is no guarding.    Mild TTP of bilateral abd without rigidity, guarding or distention.   Musculoskeletal: Normal range of motion.  Neurological: She is alert and oriented to person, place, and time.  Skin: Skin is warm and dry.  Psychiatric: She has a normal mood and affect.  Nursing note and vitals reviewed.    ED Treatments / Results  Labs (all labs ordered are listed, but only abnormal results are  displayed) Labs Reviewed  CBC - Abnormal; Notable for the following components:      Result Value   Hemoglobin 11.9 (*)    All other components within normal limits  COMPREHENSIVE METABOLIC PANEL - Abnormal; Notable for the following components:   AST 13 (*)    ALT 10 (*)    Total Bilirubin 0.2 (*)    Anion gap 4 (*)    All other components within normal limits  LIPASE, BLOOD  I-STAT TROPONIN, ED  I-STAT BETA HCG BLOOD, ED ([facility], [facility], [facility] ONLY)    EKG  EKG Interpretation  Date/Time:  Tuesday November 02 2017 14:54:09 EDT Ventricular Rate:  65 PR Interval:    QRS Duration: 87 QT Interval:  400 QTC Calculation: 416 R Axis:   88 Text Interpretation:  Sinus rhythm Borderline T abnormalities, anterior leads S1Q3T3 IS NEW Nonspecific ST abnormality Confirmed by Derwood Kaplan [phone]) on 11/02/2017 5:33:21 PM       Radiology Dg Chest 2 View  Result Date: 11/02/2017 CLINICAL DATA:  Pt reports was seen at urgent care for central chest pain with no radiation. Had abnormal ECG. Also reports shortness of breath . Denies cough, or use of recreational drugs. Reports abdominal pain, intermittent nausea. EXAM: CHEST - 2 VIEW COMPARISON:  Chest x-ray 03/26/2009 FINDINGS: The heart size and mediastinal contours are within normal limits. Both lungs are clear. The visualized skeletal structures are unremarkable. IMPRESSION: No active cardiopulmonary disease. Electronically Signed   By: Norva Pavlov M.D.   On: 11/02/2017 15:25    Procedures Procedures (including critical care time)  Medications Ordered in ED Medications  ondansetron (ZOFRAN) injection 4 mg (4 mg Intravenous Given 11/02/17 1823)  ketorolac (TORADOL) 15 MG/ML injection 15 mg (15 mg Intravenous Given 11/02/17 1823)  gi cocktail (Maalox,Lidocaine,Donnatal) (30 mLs Oral Given 11/02/17 1935)     Initial Impression / Assessment and Plan / ED Course  I have reviewed the triage vital signs and the nursing notes.  Pertinent labs &  imaging results that were available during my care of the patient were reviewed by me and considered in my medical decision making (see chart for details).     Presenting for evaluation of chest pain and abdominal pain.  Physical exam reassuring, patient is afebrile not tachycardic.  She appears nontoxic.  EKG without signs  of STEMI.  CXR viewed and interpreted by me, no sign of PNA or other acute findings. Will obtain troponin, basic abdominal labs, and give Zofran and ketorolac for sx control.  Labs reassuring, no leukocytosis.  Troponin negative.  Electrolytes stable.  Creatinine stable.  Hemoglobin slightly low at 11.9.  Patient reports pain is mildly improved with ketorolac.  Discussed findings with patient.  Discussed that at this time, doubt ACS.  Likely all GI related.  Will trial GI cocktail and p.o. Challenge.  Tolerated p.o. without difficulty or increased symptoms.  Symptoms improved with GI cocktail.  At this time, doubt intra-abdominal infection, perforation, obstruction.  Doubt PE, patient PERC negative.  Discussed findings with patient.  Discussed discharge with MiraLAX and ranitidine.  Follow-up with primary care as needed.  At this time, patient appears safe for discharge.  Return precautions given.  Patient states she understands and agrees to plan.   Final Clinical Impressions(s) / ED Diagnoses   Final diagnoses:  Atypical chest pain  Acute bilateral lower abdominal pain    ED Discharge Orders        Ordered    polyethylene glycol powder (GLYCOLAX/MIRALAX) powder   Once     11/02/17 2021    ranitidine (ZANTAC) 150 MG capsule  Daily     11/02/17 2021       Alveria Apley, PA-C 11/02/17 2135    Derwood Kaplan, MD 11/04/17 1538

## 2017-11-02 NOTE — Discharge Instructions (Signed)
Use 1 capful of miralax daily to encourage a bowel movement. If this does not work, try 2 capfuls.  Use ranitidine daily to help with chest pain.  Follow up with [facility] as needed for further evaluation of your pain.  Return to the ER if you develop fevers, persistent chest pain, difficulty breathing, or any new or concerning symptoms.

## 2017-11-02 NOTE — ED Triage Notes (Signed)
Pt reports was seen at urgent care for central chest pain with no radiation.   , had abnormal ECG. Also reports shortness of breath .  Denies cough norr use of recreational drugs. Reports abdominal pain, intermittent nausea.  Alert and oriented x 4, no obvious distress.

## 2017-11-03 LAB — I-STAT BETA HCG BLOOD, ED (MC, WL, AP ONLY)

## 2018-03-24 ENCOUNTER — Ambulatory Visit (INDEPENDENT_AMBULATORY_CARE_PROVIDER_SITE_OTHER): Payer: BLUE CROSS/BLUE SHIELD | Admitting: Family

## 2018-03-24 ENCOUNTER — Encounter: Payer: Self-pay | Admitting: Family

## 2018-03-24 VITALS — BP 116/84 | HR 89 | Temp 98.1°F | Ht 66.0 in | Wt 153.0 lb

## 2018-03-24 DIAGNOSIS — F419 Anxiety disorder, unspecified: Secondary | ICD-10-CM

## 2018-03-24 DIAGNOSIS — F329 Major depressive disorder, single episode, unspecified: Secondary | ICD-10-CM

## 2018-03-24 DIAGNOSIS — R634 Abnormal weight loss: Secondary | ICD-10-CM | POA: Diagnosis not present

## 2018-03-24 DIAGNOSIS — M791 Myalgia, unspecified site: Secondary | ICD-10-CM

## 2018-03-24 DIAGNOSIS — F32A Depression, unspecified: Secondary | ICD-10-CM

## 2018-03-24 MED ORDER — MELOXICAM 7.5 MG PO TABS: 7.5000 mg | ORAL_TABLET | Freq: Every day | ORAL | 0 refills | Status: DC

## 2018-03-24 MED ORDER — DULOXETINE HCL 30 MG PO CPEP: 30.0000 mg | ORAL_CAPSULE | Freq: Every day | ORAL | 1 refills | Status: DC

## 2018-03-24 NOTE — Progress Notes (Signed)
Rachel [name] is a 32 y.o. female with the following history as recorded in Epic[employer]:  Patient Active Problem List   Diagnosis Date Noted  . Sinusitis 10/26/2016  . Influenza with respiratory manifestation other than pneumonia 10/22/2016  . Major depressive disorder, recurrent episode, moderate ([place]) 07/08/2016  . Shift work sleep disorder 04/03/2016  . Routine general medical examination at a health care facility 08/06/2015  . Severe depression ([place]) 01/09/2015  . Migraine with aura and without status migrainosus, not intractable 12/04/2014  . Migraine 09/14/2014  . Essential hypertension 09/14/2014  . Transient hypertension of pregnancy, with delivery 10/19/2012  . S/P cesarean section 10/06/2012  . 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 10/05/2012  . Multiple gestation with malpresentation of one fetus or more 10/05/2012    Current Outpatient Medications  Medication Sig Dispense Refill  . acetaminophen (TYLENOL) 500 MG tablet Take 1,000 mg by mouth daily as needed (CHEST PAIN, CRAMPS).    . Multiple Vitamin (MULTIVITAMIN WITH MINERALS) TABS tablet Take 2 tablets by mouth daily.    . DULoxetine (CYMBALTA) 30 MG capsule Take 1 capsule (30 mg total) by mouth daily for 30 doses. 30 capsule 1  . meloxicam (MOBIC) 7.5 MG tablet Take 1 tablet (7.5 mg total) by mouth daily. 30 tablet 0   No current facility-administered medications for this visit.     Allergies: Patient has no known allergies.  Past Medical History:  Diagnosis Date  . Abnormal Pap smear   . Anemia    1st pregnancy  . GERD (gastroesophageal reflux disease)    pregnancy related  . Headache   . Hx of migraines   . Hypertension   . Multiple gestation with malpresentation of one fetus or more 10/05/2012  . Pregnancy induced hypertension   . S/P cesarean section 10/06/2012  . Shortness of breath    with bronchitis  . Transient hypertension of pregnancy, with delivery 10/19/2012  . Twin pregnancy  with two placentas and two amniotic sacs 10/05/2012    Past Surgical History:  Procedure Laterality Date  . BILATERAL SALPINGECTOMY Bilateral 10/06/2012   Procedure: BILATERAL SALPINGECTOMY;  Surgeon: Thornell Sartorius, MD;  Location: [facility];  Service: Gynecology;  Laterality: Bilateral;  . CARPAL TUNNEL RELEASE Bilateral 04/12/2014   Procedure: RIGHT CARPAL TUNNEL RELEASE, LEFT CARPAL TUNNEL RELEASE;  Surgeon: Daryll Brod, MD;  Location: [facility];  Service: Orthopedics;  Laterality: Bilateral;  . CESAREAN SECTION N/A 10/06/2012   Procedure: CESAREAN SECTION;  Surgeon: Thornell Sartorius, MD;  Location: [facility];  Service: Obstetrics;  Laterality: N/A;  PRIMARY C/S-TWINS  . TUBAL LIGATION    . WISDOM TOOTH EXTRACTION      Family History  Problem Relation Age of Onset  . Hypertension Mother   . Asthma Mother   . Hypertension Father   . Cancer Father        esophageal   . Hypertension Maternal Grandmother   . Diabetes Maternal Grandmother   . Cancer Maternal Grandfather   . Cancer Paternal Grandmother   . Hypertension Paternal Grandmother     Social History   Tobacco Use  . Smoking status: Former Smoker    Packs/day: 0.15    Years: 3.00    Pack years: 0.45    Types: Cigarettes  . Smokeless tobacco: Never Used  . Tobacco comment: quit 2008  Substance Use Topics  . Alcohol use: Yes    Alcohol/week: 4.0 standard drinks    Types: 2 Cans of beer, 2  Shots of liquor per week    Comment: drinks on weekends    Subjective:  Patient presents to transfer care from another provider who has left the practice; has not been seen since April 2018; history of migraine headaches; started last Friday with migraine and lasted through the entire weekend; admits having increased stress and fatigue recently; has lost 30 pounds in the past year due to personal stressors; is working 3rd shift at [employer] admits that sleep pattern is not good- off at 6:30 am/ has 4 children at home and has to manage their  needs; Feels like her stress/ anxiety are driving her headaches; prefers not to get updated Rx for Zomig again; is interested in trying medication for anxiety/ depression; Also notes that her job is physical in nature- does heavy lifting in a warehouse; having more "aches and pains" getting used to the work; would like medication to use as needed.  LMP- last week; tubal ligation for contraception  Objective:  Vitals:   03/24/18 1324  BP: 116/84  Pulse: 89  Temp: 98.1 F (36.7 C)  TempSrc: Oral  SpO2: 98%  Weight: 153 lb 0.6 oz (69.4 kg)  Height: '5\' 6"'  (1.676 m)    General: Well developed, well nourished, in no acute distress  Skin : Warm and dry.  Head: Normocephalic and atraumatic  Eyes: Sclera and conjunctiva clear; pupils round and reactive to light; extraocular movements intact  Ears: External normal; canals clear; tympanic membranes normal  Oropharynx: Pink, supple. No suspicious lesions  Neck: Supple without thyromegaly, adenopathy  Lungs: Respirations unlabored; clear to auscultation bilaterally without wheeze, rales, rhonchi  CVS exam: normal rate and regular rhythm.  Musculoskeletal: No deformities; no active joint inflammation  Extremities: No edema, cyanosis, clubbing  Vessels: Symmetric bilaterally  Neurologic: Alert and oriented; speech intact; face symmetrical; moves all extremities well; CNII-XII intact without focal deficit   Assessment:  1. Weight loss   2. Myalgia   3. Anxiety and depression     Plan:  Suspect symptoms are stress related; will update labs today; encouraged her to get on as normal a sleep schedule as possible working 3rd shift; she defers trial of medication for migraines; prefers to try treating for anxiety/ depression as she feels this is triggering majority of her headaches; agree this reasonable- trial of Cymbalta 30 mg qd; follow-up in 1 month to evaluate response; will update pap smear at that time. She is also given Rx for Mobic 7.5 mg  to use as needed for pains secondary to physical nature of her job.  Spent 30 minutes with patient; greater than 50% spent in counseling;    No follow-ups on file.  Orders Placed This Encounter  Procedures  . CBC w/Diff    Standing Status:   Future    Standing Expiration Date:   03/24/2019  . Comp Met (CMET)    Standing Status:   Future    Standing Expiration Date:   03/24/2019  . TSH    Standing Status:   Future    Standing Expiration Date:   03/24/2019  . Vitamin D (25 hydroxy)    Standing Status:   Future    Standing Expiration Date:   03/24/2019    Requested Prescriptions   Signed Prescriptions Disp Refills  . DULoxetine (CYMBALTA) 30 MG capsule 30 capsule 1    Sig: Take 1 capsule (30 mg total) by mouth daily for 30 doses.  . meloxicam (MOBIC) 7.5 MG tablet 30 tablet 0  Sig: Take 1 tablet (7.5 mg total) by mouth daily.

## 2018-03-30 ENCOUNTER — Emergency Department (HOSPITAL_COMMUNITY)
Admission: EM | Admit: 2018-03-30 | Discharge: 2018-03-31 | Disposition: A | Discharge status: Home / Self Care | Payer: BLUE CROSS/BLUE SHIELD | Attending: Emergency Medicine | Admitting: Emergency Medicine

## 2018-03-30 ENCOUNTER — Encounter (HOSPITAL_COMMUNITY): Payer: Self-pay

## 2018-03-30 DIAGNOSIS — H00011 Hordeolum externum right upper eyelid: Secondary | ICD-10-CM | POA: Insufficient documentation

## 2018-03-30 DIAGNOSIS — L03213 Periorbital cellulitis: Secondary | ICD-10-CM | POA: Insufficient documentation

## 2018-03-30 DIAGNOSIS — H5711 Ocular pain, right eye: Secondary | ICD-10-CM | POA: Diagnosis not present

## 2018-03-30 DIAGNOSIS — Z87891 Personal history of nicotine dependence: Secondary | ICD-10-CM | POA: Diagnosis not present

## 2018-03-30 NOTE — ED Triage Notes (Signed)
Pt complains of eye swelling since Monday night, she states that it started with itching and now it's red and swollen

## 2018-03-31 MED ORDER — CLINDAMYCIN HCL 150 MG PO CAPS: 450.0000 mg | ORAL_CAPSULE | Freq: Three times a day (TID) | ORAL | 0 refills | Status: AC

## 2018-03-31 MED ORDER — ERYTHROMYCIN 5 MG/GM OP OINT: TOPICAL_OINTMENT | OPHTHALMIC | 0 refills | Status: DC

## 2018-03-31 NOTE — Discharge Instructions (Addendum)
Warm compresses to the eyes four time a day.  Call your eye doctor and schedule an appointment.

## 2018-03-31 NOTE — ED Provider Notes (Signed)
[facility]-EMERGENCY DEPT Provider Note   CSN: [id number] Arrival date & time: 03/30/18  2202     History   Chief Complaint Chief Complaint  Patient presents with  . Eye Pain    HPI Rachel [name] is a 32 y.o. female.  32 yo F with a chief complaint of right upper eyelid itching and pain.  This started yesterday and is gotten worse today.  Nuys fevers or chills.  She has had some increased eye watering and feels that she has trouble seeing through the eye watering but otherwise has no trouble with her vision.  Denies pain with extraocular movement.  She denies trauma.  The history is provided by the patient.  Eye Pain  This is a new problem. The current episode started yesterday. The problem occurs constantly. The problem has been gradually worsening. Pertinent negatives include no chest pain, no headaches and no shortness of breath. Nothing aggravates the symptoms. Nothing relieves the symptoms. She has tried nothing for the symptoms.    Past Medical History:  Diagnosis Date  . Abnormal Pap smear   . Anemia    1st pregnancy  . GERD (gastroesophageal reflux disease)    pregnancy related  . Headache   . Hx of migraines   . Hypertension   . Multiple gestation with malpresentation of one fetus or more 10/05/2012  . Pregnancy induced hypertension   . S/P cesarean section 10/06/2012  . Shortness of breath    with bronchitis  . Transient hypertension of pregnancy, with delivery 10/19/2012  . 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 10/05/2012    Patient Active Problem List   Diagnosis Date Noted  . Sinusitis 10/26/2016  . Influenza with respiratory manifestation other than pneumonia 10/22/2016  . Major depressive disorder, recurrent episode, moderate (HCC) 07/08/2016  . Shift work sleep disorder 04/03/2016  . Routine general medical examination at a health care facility 08/06/2015  . Severe depression (HCC) 01/09/2015  . Migraine with  aura and without status migrainosus, not intractable 12/04/2014  . Migraine 09/14/2014  . Essential hypertension 09/14/2014  . Transient hypertension of pregnancy, with delivery 10/19/2012  . S/P cesarean section 10/06/2012  . 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 10/05/2012  . Multiple gestation with malpresentation of one fetus or more 10/05/2012    Past Surgical History:  Procedure Laterality Date  . BILATERAL SALPINGECTOMY Bilateral 10/06/2012   Procedure: BILATERAL SALPINGECTOMY;  Surgeon: Sherron MondayJody Bovard, MD;  Location: [facility];  Service: Gynecology;  Laterality: Bilateral;  . CARPAL TUNNEL RELEASE Bilateral 04/12/2014   Procedure: RIGHT CARPAL TUNNEL RELEASE, LEFT CARPAL TUNNEL RELEASE;  Surgeon: Cindee SaltGary Kuzma, MD;  Location: [facility];  Service: Orthopedics;  Laterality: Bilateral;  . CESAREAN SECTION N/A 10/06/2012   Procedure: CESAREAN SECTION;  Surgeon: Sherron MondayJody Bovard, MD;  Location: [facility];  Service: Obstetrics;  Laterality: N/A;  PRIMARY C/S-TWINS  . TUBAL LIGATION    . WISDOM TOOTH EXTRACTION       OB History    Gravida  2   Para  2   Term  2   Preterm      AB      Living  3     SAB      TAB      Ectopic      Multiple  1   Live Births  2            Home Medications    Prior  to Admission medications   Medication Sig Start Date End Date Taking? Authorizing Provider  acetaminophen (TYLENOL) 500 MG tablet Take 1,000 mg by mouth daily as needed (CHEST PAIN, CRAMPS).    [provider]  clindamycin (CLEOCIN) 150 MG capsule Take 3 capsules (450 mg total) by mouth 3 (three) times daily for 7 days. 03/31/18 04/07/18  Melene PlanFloyd, Daimien Patmon, DO  DULoxetine (CYMBALTA) 30 MG capsule Take 1 capsule (30 mg total) by mouth daily for 30 doses. 03/24/18 04/23/18  Olive BassMurray, Laura Woodruff, FNP  erythromycin ophthalmic ointment Place a 1/2 inch ribbon of ointment into the lower eyelid four times a day 03/31/18   Melene PlanFloyd, Nijah Tejera, DO  meloxicam (MOBIC) 7.5 MG  tablet Take 1 tablet (7.5 mg total) by mouth daily. 03/24/18   Olive BassMurray, Laura Woodruff, FNP  Multiple Vitamin (MULTIVITAMIN WITH MINERALS) TABS tablet Take 2 tablets by mouth daily.    [provider]    Family History Family History  Problem Relation Age of Onset  . Hypertension Mother   . Asthma Mother   . Hypertension Father   . Cancer Father        esophageal   . Hypertension Maternal Grandmother   . Diabetes Maternal Grandmother   . Cancer Maternal Grandfather   . Cancer Paternal Grandmother   . Hypertension Paternal Grandmother     Social History Social History   Tobacco Use  . Smoking status: Former Smoker    Packs/day: 0.15    Years: 3.00    Pack years: 0.45    Types: Cigarettes  . Smokeless tobacco: Never Used  . Tobacco comment: quit 2008  Substance Use Topics  . Alcohol use: Yes    Alcohol/week: 4.0 standard drinks    Types: 2 Cans of beer, 2 Shots of liquor per week    Comment: drinks on weekends  . Drug use: No     Allergies   Patient has no known allergies.   Review of Systems Review of Systems  Constitutional: Negative for chills and fever.  HENT: Negative for congestion and rhinorrhea.   Eyes: Positive for pain. Negative for redness and visual disturbance.  Respiratory: Negative for shortness of breath and wheezing.   Cardiovascular: Negative for chest pain and palpitations.  Gastrointestinal: Negative for nausea and vomiting.  Genitourinary: Negative for dysuria and urgency.  Musculoskeletal: Negative for arthralgias and myalgias.  Skin: Negative for pallor and wound.  Neurological: Negative for dizziness and headaches.     Physical Exam Updated Vital Signs BP (!) 146/83 (BP Location: Left Arm)   Pulse 84   Temp 98.8 F (37.1 C) (Oral)   Resp 15   Ht 5\' 7"  (1.702 m)   Wt 67.6 kg   LMP 02/21/2018   SpO2 100%   BMI 23.34 kg/m   Physical Exam  Constitutional: She is oriented to person, place, and time. She appears  well-developed and well-nourished. No distress.  HENT:  Head: Normocephalic and atraumatic.  Eyes: Pupils are equal, round, and reactive to light. EOM are normal.    Neck: Normal range of motion. Neck supple.  Cardiovascular: Normal rate and regular rhythm. Exam reveals no gallop and no friction rub.  No murmur heard. Pulmonary/Chest: Effort normal. She has no wheezes. She has no rales.  Abdominal: Soft. She exhibits no distension. There is no tenderness.  Musculoskeletal: She exhibits no edema or tenderness.  Neurological: She is alert and oriented to person, place, and time.  Skin: Skin is warm and dry. She is not diaphoretic.  Psychiatric: She has a normal mood and affect. Her behavior is normal.  Nursing note and vitals reviewed.    ED Treatments / Results  Labs (all labs ordered are listed, but only abnormal results are displayed) Labs Reviewed - No data to display  EKG None  Radiology No results found.  Procedures Procedures (including critical care time)  Medications Ordered in ED Medications - No data to display   Initial Impression / Assessment and Plan / ED Course  I have reviewed the triage vital signs and the nursing notes.  Pertinent labs & imaging results that were available during my care of the patient were reviewed by me and considered in my medical decision making (see chart for details).     32 yo F with a cc of her head with pain and swelling.  This is likely a stye with extending cellulitis.  Will start on antibiotics, give topical as well.  She has an ophthalmologist.  We will have her follow-up with them.  Discharge home.  12:22 AM:  I have discussed the diagnosis/risks/treatment options with the patient and family and believe the pt to be eligible for discharge home to follow-up with Ophtho. We also discussed returning to the ED immediately if new or worsening sx occur. We discussed the sx which are most concerning (e.g., sudden worsening pain,  fever, pain with EOM) that necessitate immediate return. Medications administered to the patient during their visit and any new prescriptions provided to the patient are listed below.  Medications given during this visit Medications - No data to display    The patient appears reasonably screen and/or stabilized for discharge and I doubt any other medical condition or other [facility] requiring further screening, evaluation, or treatment in the ED at this time prior to discharge.    Final Clinical Impressions(s) / ED Diagnoses   Final diagnoses:  Preseptal cellulitis of right upper eyelid  Hordeolum externum of right upper eyelid    ED Discharge Orders         Ordered    clindamycin (CLEOCIN) 150 MG capsule  3 times daily     03/31/18 0013    erythromycin ophthalmic ointment     03/31/18 0013           Melene Plan, DO 03/31/18 0022

## 2018-04-02 IMAGING — CR DG CHEST 2V
2 series · 2 of 2 positions shown · non-contrast
Comparison: Chest x-ray 03/26/2009

CLINICAL DATA: Pt reports was seen at [HOSPITAL] for central chest
pain with no radiation. Had abnormal ECG. Also reports shortness of
breath . Denies cough, or use of recreational drugs. Reports
abdominal pain, intermittent nausea.

EXAM:
CHEST - 2 VIEW

[w chest lat (1 of 2)]
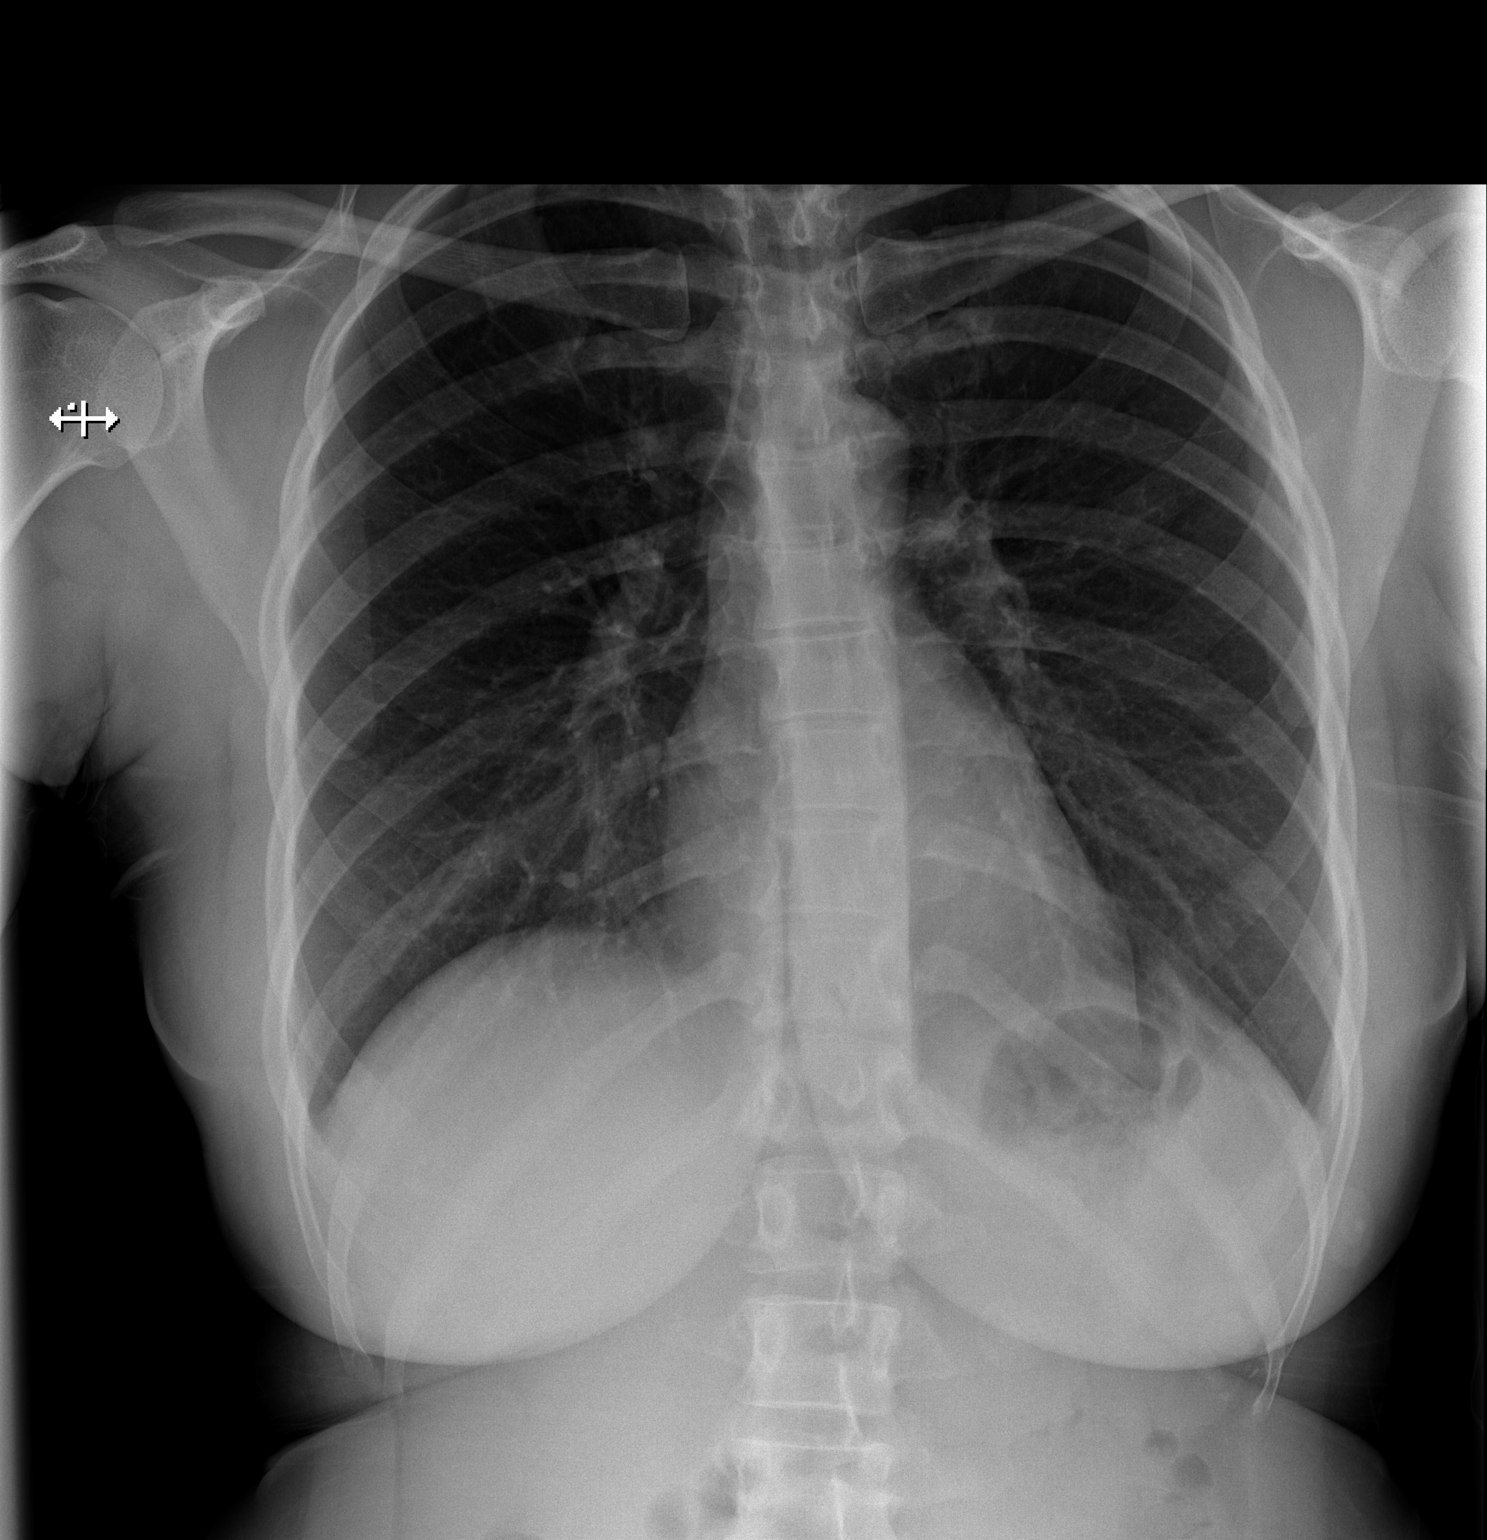

[w chest lat (2 of 2)]
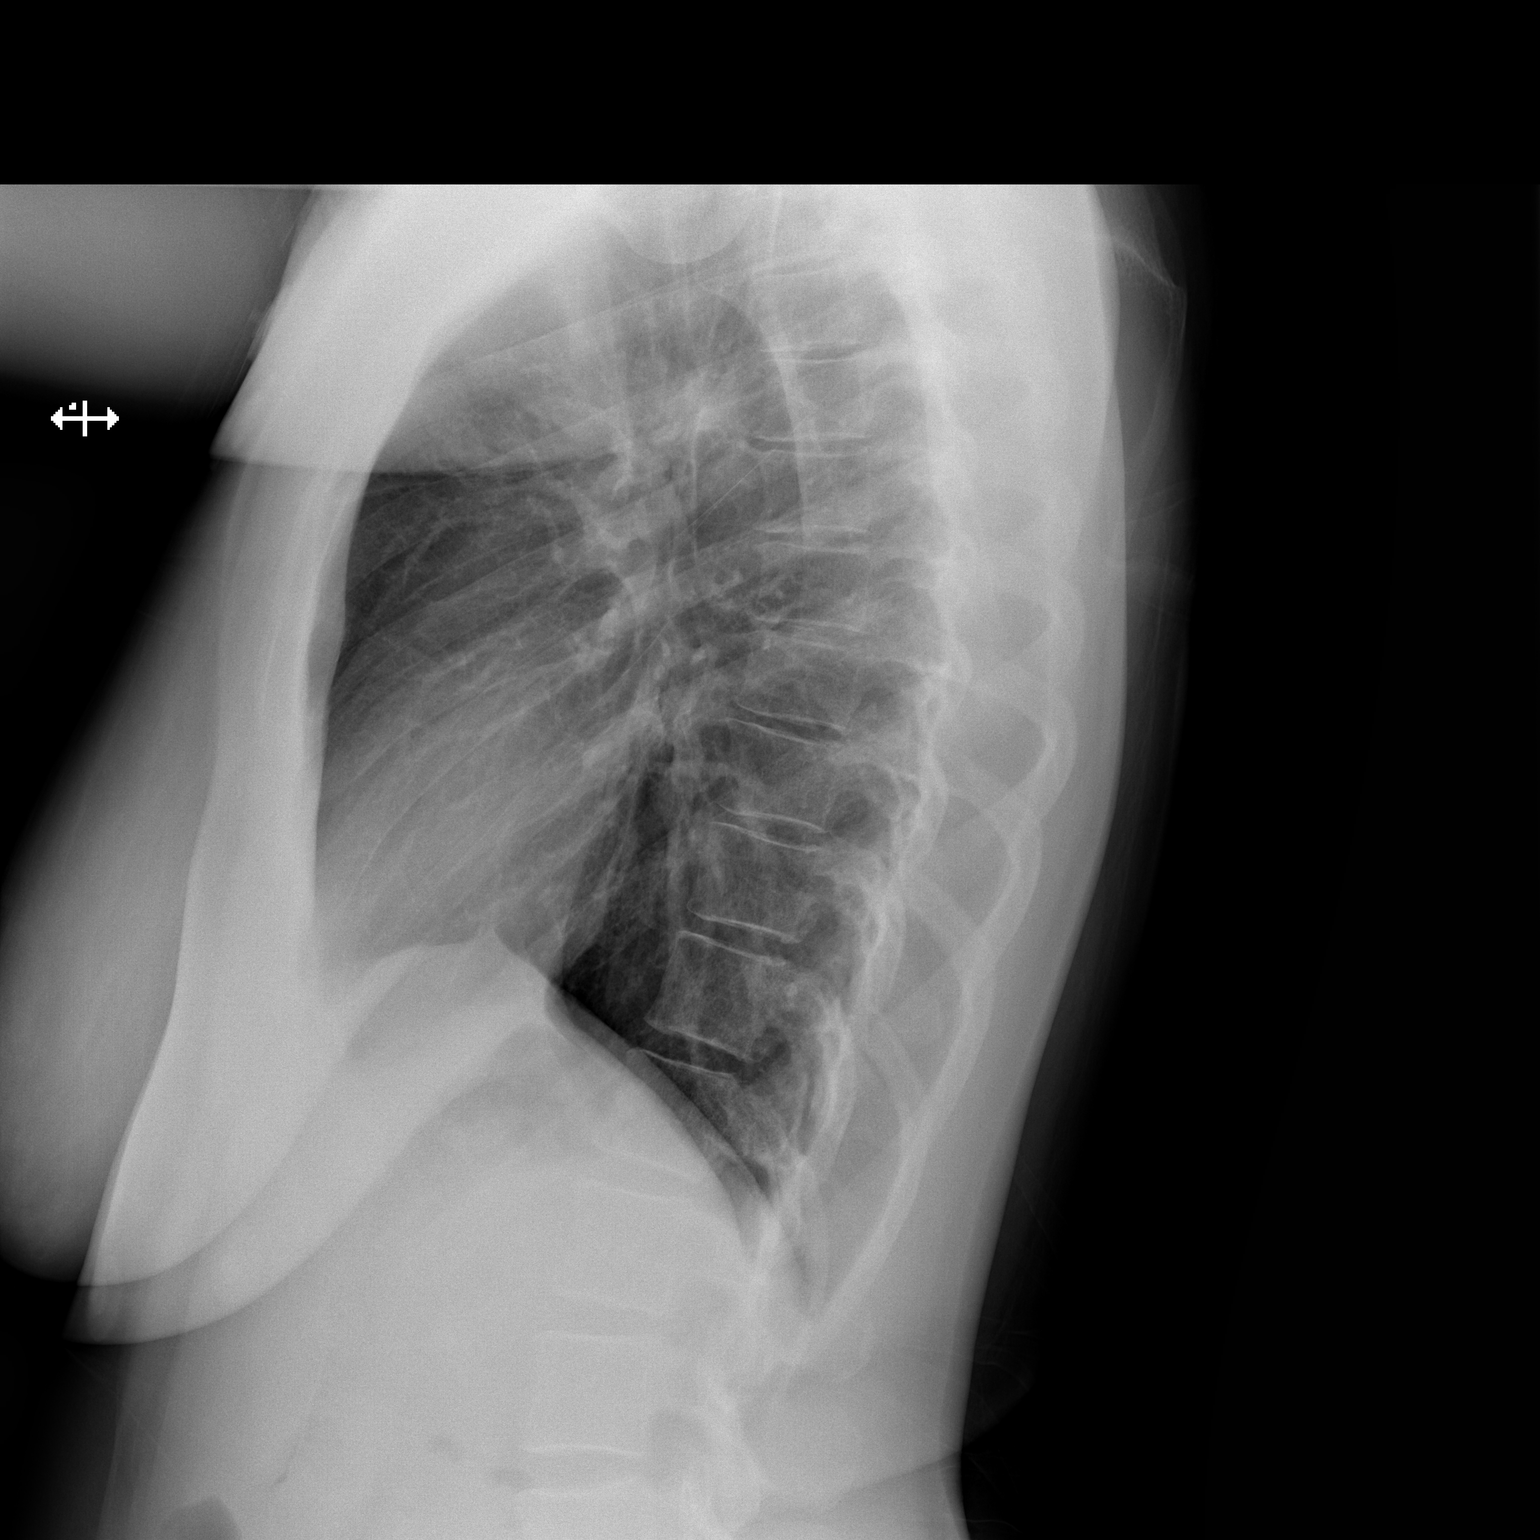

[2 of 2 positions shown; findings below may reference images not displayed]

FINDINGS: The heart size and mediastinal contours are within normal limits.
Both lungs are clear. The visualized skeletal structures are
unremarkable.
IMPRESSION: No active cardiopulmonary disease.

## 2018-04-18 ENCOUNTER — Other Ambulatory Visit: Payer: Self-pay | Admitting: Family

## 2018-04-27 ENCOUNTER — Ambulatory Visit: Payer: Self-pay | Admitting: Family

## 2018-04-27 DIAGNOSIS — Z0289 Encounter for other administrative examinations: Secondary | ICD-10-CM

## 2018-04-29 ENCOUNTER — Ambulatory Visit (INDEPENDENT_AMBULATORY_CARE_PROVIDER_SITE_OTHER): Payer: BLUE CROSS/BLUE SHIELD | Admitting: Family

## 2018-04-29 ENCOUNTER — Encounter: Payer: Self-pay | Admitting: Family

## 2018-04-29 VITALS — BP 110/82 | HR 84 | Temp 98.1°F | Ht 67.0 in | Wt 152.0 lb

## 2018-04-29 DIAGNOSIS — N946 Dysmenorrhea, unspecified: Secondary | ICD-10-CM

## 2018-04-29 DIAGNOSIS — R5383 Other fatigue: Secondary | ICD-10-CM | POA: Diagnosis not present

## 2018-04-29 DIAGNOSIS — F4321 Adjustment disorder with depressed mood: Secondary | ICD-10-CM | POA: Diagnosis not present

## 2018-04-29 DIAGNOSIS — G43809 Other migraine, not intractable, without status migrainosus: Secondary | ICD-10-CM | POA: Diagnosis not present

## 2018-04-29 MED ORDER — DULOXETINE HCL 30 MG PO CPEP: 30.0000 mg | ORAL_CAPSULE | Freq: Every day | ORAL | 4 refills | Status: DC

## 2018-04-29 MED ORDER — METHOCARBAMOL 500 MG PO TABS: 500.0000 mg | ORAL_TABLET | Freq: Three times a day (TID) | ORAL | 0 refills | Status: DC | PRN

## 2018-04-29 NOTE — Progress Notes (Signed)
Rachel [name] is a 32 y.o. female with the following history as recorded in EpicCare:  Patient Active Problem List   Diagnosis Date Noted  . Sinusitis 10/26/2016  . Influenza with respiratory manifestation other than pneumonia 10/22/2016  . Major depressive disorder, recurrent episode, moderate (HCC) 07/08/2016  . Shift work sleep disorder 04/03/2016  . Routine general medical examination at a health care facility 08/06/2015  . Severe depression (HCC) 01/09/2015  . Migraine with aura and without status migrainosus, not intractable 12/04/2014  . Migraine 09/14/2014  . Transient hypertension of pregnancy, with delivery 10/19/2012  . S/P cesarean section 10/06/2012  . 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 10/05/2012  . Multiple gestation with malpresentation of one fetus or more 10/05/2012    Current Outpatient Medications  Medication Sig Dispense Refill  . meloxicam (MOBIC) 7.5 MG tablet TAKE 1 TABLET BY MOUTH EVERY DAY 30 tablet 0  . Multiple Vitamin (MULTIVITAMIN WITH MINERALS) TABS tablet Take 2 tablets by mouth daily.    . DULoxetine (CYMBALTA) 30 MG capsule Take 1 capsule (30 mg total) by mouth daily for 30 doses. 30 capsule 4  . methocarbamol (ROBAXIN) 500 MG tablet Take 1 tablet (500 mg total) by mouth every 8 (eight) hours as needed. For muscle spasm/ headache 30 tablet 0   No current facility-administered medications for this visit.     Allergies: Patient has no known allergies.  Past Medical History:  Diagnosis Date  . Abnormal Pap smear   . Anemia    1st pregnancy  . GERD (gastroesophageal reflux disease)    pregnancy related  . Headache   . Hx of migraines   . Hypertension   . Multiple gestation with malpresentation of one fetus or more 10/05/2012  . Pregnancy induced hypertension   . S/P cesarean section 10/06/2012  . Shortness of breath    with bronchitis  . Transient hypertension of pregnancy, with delivery 10/19/2012  . Twin pregnancy with  two placentas and two amniotic sacs 10/05/2012    Past Surgical History:  Procedure Laterality Date  . BILATERAL SALPINGECTOMY Bilateral 10/06/2012   Procedure: BILATERAL SALPINGECTOMY;  Surgeon: Sherron MondayJody Bovard, MD;  Location: [facility];  Service: Gynecology;  Laterality: Bilateral;  . CARPAL TUNNEL RELEASE Bilateral 04/12/2014   Procedure: RIGHT CARPAL TUNNEL RELEASE, LEFT CARPAL TUNNEL RELEASE;  Surgeon: Cindee SaltGary Kuzma, MD;  Location: [facility];  Service: Orthopedics;  Laterality: Bilateral;  . CESAREAN SECTION N/A 10/06/2012   Procedure: CESAREAN SECTION;  Surgeon: Sherron MondayJody Bovard, MD;  Location: [facility];  Service: Obstetrics;  Laterality: N/A;  PRIMARY C/S-TWINS  . TUBAL LIGATION    . WISDOM TOOTH EXTRACTION      Family History  Problem Relation Age of Onset  . Hypertension Mother   . Asthma Mother   . Hypertension Father   . Cancer Father        esophageal   . Hypertension Maternal Grandmother   . Diabetes Maternal Grandmother   . Cancer Maternal Grandfather   . Cancer Paternal Grandmother   . Hypertension Paternal Grandmother     Social History   Tobacco Use  . Smoking status: Former Smoker    Packs/day: 0.15    Years: 3.00    Pack years: 0.45    Types: Cigarettes  . Smokeless tobacco: Never Used  . Tobacco comment: quit 2008  Substance Use Topics  . Alcohol use: Yes    Alcohol/week: 4.0 standard drinks    Types: 2 Cans of beer,  2 Shots of liquor per week    Comment: drinks on weekends    Subjective:  Patient presents to follow-up on recent start of Cymbalta for her anxiety/ stress; is pleased with her response; would like to continue the medication; Is planning to follow-up with her eye doctor soon; was seen at ER with stye; does still have problems with chronic headaches/ known history of migraines- defers seeing neurologist right now; wants to see eye doctor first;      Objective:  Vitals:   04/29/18 1323  BP: 110/82  Pulse: 84  Temp: 98.1 F (36.7 C)   TempSrc: Oral  SpO2: 99%  Weight: 152 lb 0.6 oz (69 kg)  Height: 5\' 7"  (1.702 m)    General: Well developed, well nourished, in no acute distress  Skin : Warm and dry.  Head: Normocephalic and atraumatic  Lungs: Respirations unlabored; clear to auscultation bilaterally without wheeze, rales, rhonchi  CVS exam: normal rate and regular rhythm.  Neurologic: Alert and oriented; speech intact; face symmetrical; moves all extremities well; CNII-XII intact without focal deficit   Assessment:  1. Other fatigue   2. Situational depression   3. Other migraine without status migrainosus, not intractable   4. Menstrual cramps     Plan:  1. & 2. Good response to Cymbalta 30 mg; refill updated; follow-up in 4 months, sooner prn. 3. Patient defers seeing neurology at this time; she will get her eye exam updated; ? Muscular component to her headaches- trial of Robaxin; could refer to sports medicine for trigger point injections; 4. Patient will check to see if her Medicaid will cover cost of pelvic ultrasound- call back and can order; will plan to update pap smear in 4 months.   Return in about 4 months (around 08/29/2018).  No orders of the defined types were placed in this encounter.   Requested Prescriptions   Signed Prescriptions Disp Refills  . DULoxetine (CYMBALTA) 30 MG capsule 30 capsule 4    Sig: Take 1 capsule (30 mg total) by mouth daily for 30 doses.  . methocarbamol (ROBAXIN) 500 MG tablet 30 tablet 0    Sig: Take 1 tablet (500 mg total) by mouth every 8 (eight) hours as needed. For muscle spasm/ headache

## 2018-05-24 ENCOUNTER — Other Ambulatory Visit: Payer: Self-pay | Admitting: Family

## 2018-05-25 ENCOUNTER — Telehealth: Payer: Self-pay | Admitting: Family

## 2018-05-25 NOTE — Telephone Encounter (Signed)
Spoke with patient and appointment made

## 2018-05-25 NOTE — Telephone Encounter (Signed)
Copied from CRM [id number]. Topic: Quick Communication - See Telephone Encounter >> May 25, 2018 12:07 PM Windy Kalata, NT wrote: patient is requesting to speak with Ria Clock, nurse, to give her a call. She states she is about to go out of town for a week with work and is having sharp pains in her left shoulder. Wants to know how she is supposed to go about this and if something could be called in. Please advise.  [facility] [phone] - [place], [place] - [phone] AT [facility] [address] & [address] [place] [address] Phone: [phone] Fax: [phone]

## 2018-05-25 NOTE — Telephone Encounter (Signed)
It would be best for her to be seen; maybe Raynelle Fanning can see her tomorrow?

## 2018-05-26 ENCOUNTER — Ambulatory Visit (INDEPENDENT_AMBULATORY_CARE_PROVIDER_SITE_OTHER): Payer: BLUE CROSS/BLUE SHIELD | Admitting: Family Medicine

## 2018-05-26 VITALS — BP 120/62 | HR 76 | Temp 98.4°F | Ht 66.0 in | Wt 153.4 lb

## 2018-05-26 DIAGNOSIS — M25512 Pain in left shoulder: Secondary | ICD-10-CM

## 2018-05-26 MED ORDER — PREDNISONE 10 MG PO TABS: ORAL_TABLET | ORAL | 0 refills | Status: DC

## 2018-05-26 NOTE — Progress Notes (Signed)
Subjective:    Patient ID: Rachel [name], female    DOB: 09-May-1986, 32 y.o.   MRN: [id number] Dorthula Matas Thompson,acting as a scribe for Inez Catalina, FNP.,have documented all relevant documentation on the behalf of Inez Catalina, FNP,as directed by  Inez Catalina, FNP while in the presence of Inez Catalina, FNP.  HPI Patient in office for left shoulder pain. She denies any injury. She has decreased motion in left arm. Pain can get to a 8/10 and is a stiff sharp pain. Pain starts at "front of shoulder" but is noted on the "back of shoulder" as well.   She has tried over the counter pain mediations, creams, rubs and soaking with little improvement. She feels like symptoms are getting worse over time. 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 She has not had any imaging on that area and this is the first time she has been evaluated. She denies any numbness or tingling. She has no changes in bowel or bladder.    She is going out of state for her work tomorrow and preferred to be evaluated before she left town.   Shoulder: Left  Pain: Aching  Onset: One month ago  Duration: constant  Rating:8/10 Radiation of Pain: No  History of Trauma: no Aggravated by: lifting arm at work overhead Alleviated by: she has had some help with ibuprofen and rest at home Treatment: she has not had been evaluated in past. She has tried creams, rubs and soaking as well as over the counter medications with limited benefit Associated weakness:No  History of surgery or injury:none; history of carpal tunnel surgery 2014. History of diabetes/chronic conditions: No diabetes; depression and migraines She is left handed and reports sleeping on her left side which can cause some discomfort.  Review of Systems  Constitutional: Negative for chills, fatigue and fever.  Respiratory: Negative for cough, shortness of breath and wheezing.   Cardiovascular: Negative for chest  pain and palpitations.  Musculoskeletal:       Left shoulder pain  Skin: Negative for rash.  Neurological: Negative for dizziness, weakness, light-headedness, numbness and headaches.  Psychiatric/Behavioral:       Denies depressed or anxious mood today   Past Medical History:  Diagnosis Date  . Abnormal Pap smear   . Anemia    1st pregnancy  . GERD (gastroesophageal reflux disease)    pregnancy related  . Headache   . Hx of migraines   . Hypertension   . Multiple gestation with malpresentation of one fetus or more 10/05/2012  . Pregnancy induced hypertension   . S/P cesarean section 10/06/2012  . Shortness of breath    with bronchitis  . Transient hypertension of pregnancy, with delivery 10/19/2012  . 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 10/05/2012     Social History   Socioeconomic History  . Marital status: Single    Spouse name: Not on file  . Number of children: 3  . Years of education: 40  . Highest education level: Not on file  Occupational History  . Occupation: Advertising account planner  Social Needs  . Financial resource strain: Not on file  . Food insecurity:    Worry: Not on file    Inability: Not on file  . Transportation needs:    Medical: Not on file    Non-medical: Not on file  Tobacco Use  . Smoking status: Former Smoker    Packs/day: 0.15  Years: 3.00    Pack years: 0.45    Types: Cigarettes  . Smokeless tobacco: Never Used  . Tobacco comment: quit 2008  Substance and Sexual Activity  . Alcohol use: Yes    Alcohol/week: 4.0 standard drinks    Types: 2 Cans of beer, 2 Shots of liquor per week    Comment: drinks on weekends  . Drug use: No  . Sexual activity: Yes    Partners: Male    Birth control/protection: Surgical  Lifestyle  . Physical activity:    Days per week: Not on file    Minutes per session: Not on file  . Stress: Not on file  Relationships  . Social connections:    Talks on phone: Not on file    Gets together:  Not on file    Attends religious service: Not on file    Active member of club or organization: Not on file    Attends meetings of clubs or organizations: Not on file    Relationship status: Not on file  . Intimate partner violence:    Fear of current or ex partner: Not on file    Emotionally abused: Not on file    Physically abused: Not on file    Forced sexual activity: Not on file  Other Topics Concern  . Not on file  Social History Narrative   Born in [place] and raised in [place]. Currently resides in a townhouse 3 children and boyfriend. No pets. Fun: ride a motorcycle.   Denies religious beliefs to effect health care.    Denies abuse and feels safe at home.    Drinks caffeine daily     Past Surgical History:  Procedure Laterality Date  . BILATERAL SALPINGECTOMY Bilateral 10/06/2012   Procedure: BILATERAL SALPINGECTOMY;  Surgeon: Sherron Monday, MD;  Location: [facility];  Service: Gynecology;  Laterality: Bilateral;  . CARPAL TUNNEL RELEASE Bilateral 04/12/2014   Procedure: RIGHT CARPAL TUNNEL RELEASE, LEFT CARPAL TUNNEL RELEASE;  Surgeon: Cindee Salt, MD;  Location: [facility];  Service: Orthopedics;  Laterality: Bilateral;  . CESAREAN SECTION N/A 10/06/2012   Procedure: CESAREAN SECTION;  Surgeon: Sherron Monday, MD;  Location: [facility];  Service: Obstetrics;  Laterality: N/A;  PRIMARY C/S-TWINS  . TUBAL LIGATION    . WISDOM TOOTH EXTRACTION      Family History  Problem Relation Age of Onset  . Hypertension Mother   . Asthma Mother   . Hypertension Father   . Cancer Father        esophageal   . Hypertension Maternal Grandmother   . Diabetes Maternal Grandmother   . Cancer Maternal Grandfather   . Cancer Paternal Grandmother   . Hypertension Paternal Grandmother     No Known Allergies  Current Outpatient Medications on File Prior to Visit  Medication Sig Dispense Refill  . DULoxetine (CYMBALTA) 30 MG capsule Take 1 capsule (30 mg total) by mouth daily for 30  doses. 30 capsule 4  . Multiple Vitamin (MULTIVITAMIN WITH MINERALS) TABS tablet Take 2 tablets by mouth daily.     No current facility-administered medications on file prior to visit.     BP 120/62   Pulse 76   Temp 98.4 F (36.9 C) (Oral)   Ht 5\' 6"  (1.676 m)   Wt 153 lb 6.4 oz (69.6 kg)   SpO2 98%   BMI 24.76 kg/m       Objective:   Physical Exam  Constitutional: She is oriented to person, place, and  time. She appears well-developed and well-nourished.  Eyes: Pupils are equal, round, and reactive to light. No scleral icterus.  Neck: Neck supple.  Cardiovascular: Normal rate, regular rhythm and normal heart sounds.  Pulmonary/Chest: Effort normal and breath sounds normal. She has no wheezes. She has no rales.  Abdominal: Soft. Bowel sounds are normal.  Musculoskeletal:  Inspection reveals no abnormalities, atrophy or asymmetry. Discomfort of shoulder with Hawkins-Kennedy test. Minor discomfort with passive, painful arc maneuver. Overhead reaching causes discomfort. No drop arm sign. No weakness present. Pain is elicited with direct pressure and active reaching. ROM is full but discomfort is present with active and passive ROM. Negative empty can test   Lymphadenopathy:    She has no cervical adenopathy.  Neurological: She is alert and oriented to person, place, and time.  Skin: Skin is warm and dry. Capillary refill takes less than 2 seconds.  Psychiatric: She has a normal mood and affect. Her behavior is normal. Judgment and thought content normal.      Assessment & Plan:  1. Left shoulder pain, unspecified chronicity Exam and history findings are most consistent with pain related to overhead reaching and use; symptoms of rotator cuff involvement, impingement are considered. No prior imaging or evaluation has been completed. We discussed that imaging likely needed and that a trial of conservative therapy of prednisone, use of ice and she will also add acetaminophen as needed  first. Further reviewed the use of NSAIDs as she was taking ibuprofen 800 mg QID versus TID. Also discussed that long term use of ibuprofen is not recommended; imaging should be completed or referral to sports medicine for further evaluation. She would like to try conservative trial first and will follow up with PCP if symptoms do not improve. She voiced understanding and agreed with plan. Strict return precautions provided.  - predniSONE (DELTASONE) 10 MG tablet; Take 4 tablets daily for 4 days, 3 tabs daily for 2 days, 2 tabs daily for 2 days, and one tab daily for 2 days.  Dispense: 28 tablet; Refill: 0  Roddie Mc, FNP-C

## 2018-05-26 NOTE — Patient Instructions (Signed)
Please take medication with food as directed. If symptoms do not improve, worsen, or new symptoms develop, follow up with your provider for further evaluation.   Shoulder Pain Many things can cause shoulder pain, including:  An injury.  Moving the arm in the same way again and again (overuse).  Joint pain (arthritis).  Follow these instructions at home: Take these actions to help with your pain:  Squeeze a soft ball or a foam pad as much as you can. This helps to prevent swelling. It also makes the arm stronger.  Take over-the-counter and prescription medicines only as told by your doctor.  If told, put ice on the area: ? Put ice in a plastic bag. ? Place a towel between your skin and the bag. ? Leave the ice on for 20 minutes, 2-3 times per day. Stop putting on ice if it does not help with the pain.  If you were given a shoulder sling or immobilizer: ? Wear it as told. ? Remove it to shower or bathe. ? Move your arm as little as possible. ? Keep your hand moving. This helps prevent swelling.  Contact a doctor if:  Your pain gets worse.  Medicine does not help your pain.  You have new pain in your arm, hand, or fingers. Get help right away if:  Your arm, hand, or fingers: ? Tingle. ? Are numb. ? Are swollen. ? Are painful. ? Turn white or blue. This information is not intended to replace advice given to you by your health care provider. Make sure you discuss any questions you have with your health care provider. Document Released: 01/20/2008 Document Revised: 03/29/2016 Document Reviewed: 11/26/2014 Elsevier Interactive Patient Education  [employer].

## 2018-06-28 ENCOUNTER — Ambulatory Visit (INDEPENDENT_AMBULATORY_CARE_PROVIDER_SITE_OTHER): Payer: BLUE CROSS/BLUE SHIELD | Admitting: Family

## 2018-06-28 ENCOUNTER — Other Ambulatory Visit (INDEPENDENT_AMBULATORY_CARE_PROVIDER_SITE_OTHER): Payer: BLUE CROSS/BLUE SHIELD

## 2018-06-28 ENCOUNTER — Encounter: Payer: Self-pay | Admitting: Family

## 2018-06-28 ENCOUNTER — Other Ambulatory Visit: Payer: Self-pay | Admitting: Family

## 2018-06-28 VITALS — BP 116/72 | HR 77 | Temp 97.6°F | Ht 66.0 in | Wt 159.1 lb

## 2018-06-28 DIAGNOSIS — N946 Dysmenorrhea, unspecified: Secondary | ICD-10-CM

## 2018-06-28 DIAGNOSIS — M791 Myalgia, unspecified site: Secondary | ICD-10-CM | POA: Diagnosis not present

## 2018-06-28 DIAGNOSIS — R634 Abnormal weight loss: Secondary | ICD-10-CM

## 2018-06-28 LAB — CBC WITH DIFFERENTIAL/PLATELET
BASOS ABS: 0 10*3/uL (ref 0.0–0.1)
Basophils Relative: 0.5 % (ref 0.0–3.0)
Eosinophils Absolute: 0.3 10*3/uL (ref 0.0–0.7)
Eosinophils Relative: 5.9 % — ABNORMAL HIGH (ref 0.0–5.0)
HEMATOCRIT: 37.1 % (ref 36.0–46.0)
HEMOGLOBIN: 12.2 g/dL (ref 12.0–15.0)
LYMPHS PCT: 41.2 % (ref 12.0–46.0)
Lymphs Abs: 1.9 10*3/uL (ref 0.7–4.0)
MCHC: 33 g/dL (ref 30.0–36.0)
MCV: 86.2 fl (ref 78.0–100.0)
MONOS PCT: 9.9 % (ref 3.0–12.0)
Monocytes Absolute: 0.5 10*3/uL (ref 0.1–1.0)
NEUTROS ABS: 2 10*3/uL (ref 1.4–7.7)
Neutrophils Relative %: 42.5 % — ABNORMAL LOW (ref 43.0–77.0)
PLATELETS: 206 10*3/uL (ref 150.0–400.0)
RBC: 4.31 Mil/uL (ref 3.87–5.11)
RDW: 14.9 % (ref 11.5–15.5)
WBC: 4.6 10*3/uL (ref 4.0–10.5)

## 2018-06-28 LAB — COMPREHENSIVE METABOLIC PANEL
ALT: 6 U/L (ref 0–35)
AST: 10 U/L (ref 0–37)
Albumin: 3.9 g/dL (ref 3.5–5.2)
Alkaline Phosphatase: 47 U/L (ref 39–117)
BILIRUBIN TOTAL: 0.2 mg/dL (ref 0.2–1.2)
BUN: 11 mg/dL (ref 6–23)
CALCIUM: 8.8 mg/dL (ref 8.4–10.5)
CHLORIDE: 109 meq/L (ref 96–112)
CO2: 27 meq/L (ref 19–32)
Creatinine, Ser: 0.82 mg/dL (ref 0.40–1.20)
GFR: 104.3 mL/min (ref >60.00)
Glucose, Bld: 87 mg/dL (ref 70–99)
Potassium: 4.2 mEq/L (ref 3.5–5.1)
Sodium: 142 mEq/L (ref 135–145)
Total Protein: 6.6 g/dL (ref 6.0–8.3)

## 2018-06-28 LAB — TSH: TSH: 1.85 u[IU]/mL (ref 0.35–4.50)

## 2018-06-28 LAB — VITAMIN D 25 HYDROXY (VIT D DEFICIENCY, FRACTURES): VITD: 23.08 ng/mL — AB (ref 30.00–100.00)

## 2018-06-28 MED ORDER — VITAMIN D (ERGOCALCIFEROL) 1.25 MG (50000 UNIT) PO CAPS: 50000.0000 [IU] | ORAL_CAPSULE | ORAL | 0 refills | Status: AC

## 2018-06-28 MED ORDER — IBUPROFEN 800 MG PO TABS: 800.0000 mg | ORAL_TABLET | Freq: Three times a day (TID) | ORAL | 0 refills | Status: DC | PRN

## 2018-06-28 NOTE — Progress Notes (Signed)
Rachel [name] is a 32 y.o. female with the following history as recorded in EpicCare:  Patient Active Problem List   Diagnosis Date Noted  . Sinusitis 10/26/2016  . Influenza with respiratory manifestation other than pneumonia 10/22/2016  . Major depressive disorder, recurrent episode, moderate (HCC) 07/08/2016  . Shift work sleep disorder 04/03/2016  . Routine general medical examination at a health care facility 08/06/2015  . Severe depression (HCC) 01/09/2015  . Migraine with aura and without status migrainosus, not intractable 12/04/2014  . Migraine 09/14/2014  . Transient hypertension of pregnancy, with delivery 10/19/2012  . S/P cesarean section 10/06/2012  . 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 10/05/2012  . Multiple gestation with malpresentation of one fetus or more 10/05/2012    Current Outpatient Medications  Medication Sig Dispense Refill  . DULoxetine (CYMBALTA) 30 MG capsule   4  . Multiple Vitamin (MULTIVITAMIN WITH MINERALS) TABS tablet Take 2 tablets by mouth daily.    . DULoxetine (CYMBALTA) 30 MG capsule Take 1 capsule (30 mg total) by mouth daily for 30 doses. 30 capsule 4  . ibuprofen (ADVIL,MOTRIN) 800 MG tablet Take 1 tablet (800 mg total) by mouth every 8 (eight) hours as needed. 30 tablet 0   No current facility-administered medications for this visit.     Allergies: Patient has no known allergies.  Past Medical History:  Diagnosis Date  . Abnormal Pap smear   . Anemia    1st pregnancy  . GERD (gastroesophageal reflux disease)    pregnancy related  . Headache   . Hx of migraines   . Hypertension   . Multiple gestation with malpresentation of one fetus or more 10/05/2012  . Pregnancy induced hypertension   . S/P cesarean section 10/06/2012  . Shortness of breath    with bronchitis  . Transient hypertension of pregnancy, with delivery 10/19/2012  . 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 10/05/2012    Past Surgical  History:  Procedure Laterality Date  . BILATERAL SALPINGECTOMY Bilateral 10/06/2012   Procedure: BILATERAL SALPINGECTOMY;  Surgeon: Sherron Monday, MD;  Location: [facility];  Service: Gynecology;  Laterality: Bilateral;  . CARPAL TUNNEL RELEASE Bilateral 04/12/2014   Procedure: RIGHT CARPAL TUNNEL RELEASE, LEFT CARPAL TUNNEL RELEASE;  Surgeon: Cindee Salt, MD;  Location: [facility];  Service: Orthopedics;  Laterality: Bilateral;  . CESAREAN SECTION N/A 10/06/2012   Procedure: CESAREAN SECTION;  Surgeon: Sherron Monday, MD;  Location: [facility];  Service: Obstetrics;  Laterality: N/A;  PRIMARY C/S-TWINS  . TUBAL LIGATION    . WISDOM TOOTH EXTRACTION      Family History  Problem Relation Age of Onset  . Hypertension Mother   . Asthma Mother   . Hypertension Father   . Cancer Father        esophageal   . Hypertension Maternal Grandmother   . Diabetes Maternal Grandmother   . Cancer Maternal Grandfather   . Cancer Paternal Grandmother   . Hypertension Paternal Grandmother     Social History   Tobacco Use  . Smoking status: Former Smoker    Packs/day: 0.15    Years: 3.00    Pack years: 0.45    Types: Cigarettes  . Smokeless tobacco: Never Used  . Tobacco comment: quit 2008  Substance Use Topics  . 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  Head: Normocephalic and atraumatic  Lungs: Respirations unlabored; clear to auscultation bilaterally without wheeze, rales, rhonchi  CVS exam: normal rate and regular rhythm.  Neurologic: Alert and oriented; speech intact; face symmetrical; moves all extremities well; CNII-XII intact without focal deficit   Assessment:  1. Dysmenorrhea     Plan:  On period today/ has had BTL; due to worsening pain/ questions regarding insurance, would recommend that she go ahead and see her GYN as previously discussed; needs pelvic ultrasound but does not want me to order today due to insurance questions; Rx for Ibuprofen 800 mg tid prn; work note for yesterday and today as requested.   No follow-ups on file.  No orders of the defined types were placed in this encounter.   Requested Prescriptions   Signed Prescriptions Disp Refills  . ibuprofen (ADVIL,MOTRIN) 800 MG tablet 30 tablet 0    Sig: Take 1 tablet (800 mg total) by mouth every 8 (eight) hours as needed.

## 2018-08-16 ENCOUNTER — Other Ambulatory Visit: Payer: Self-pay | Admitting: Family

## 2018-08-24 ENCOUNTER — Ambulatory Visit (INDEPENDENT_AMBULATORY_CARE_PROVIDER_SITE_OTHER): Payer: BLUE CROSS/BLUE SHIELD

## 2018-08-24 ENCOUNTER — Encounter: Payer: Self-pay | Admitting: Family Medicine

## 2018-08-24 ENCOUNTER — Ambulatory Visit (INDEPENDENT_AMBULATORY_CARE_PROVIDER_SITE_OTHER): Payer: BLUE CROSS/BLUE SHIELD | Admitting: Family Medicine

## 2018-08-24 VITALS — BP 140/86 | HR 88 | Temp 98.5°F | Ht 66.0 in | Wt 151.0 lb

## 2018-08-24 DIAGNOSIS — G8929 Other chronic pain: Secondary | ICD-10-CM | POA: Diagnosis not present

## 2018-08-24 DIAGNOSIS — M25512 Pain in left shoulder: Secondary | ICD-10-CM | POA: Diagnosis not present

## 2018-08-24 MED ORDER — IBUPROFEN-FAMOTIDINE 800-26.6 MG PO TABS: 1.0000 | ORAL_TABLET | Freq: Three times a day (TID) | ORAL | 3 refills | Status: DC

## 2018-08-24 MED ORDER — DICLOFENAC SODIUM 2 % TD SOLN: 1.0000 "application " | Freq: Two times a day (BID) | TRANSDERMAL | 3 refills | Status: DC

## 2018-08-24 NOTE — Patient Instructions (Signed)
Nice to meet you  Romie Minus try the medicine  Please try the exercises  Please try ice on the area  Please see me back in 3 weeks

## 2018-08-24 NOTE — Progress Notes (Signed)
Rachel [name] - 33 y.o. female MRN [id number]  Date of birth: 24-Aug-1985  SUBJECTIVE:  Including CC & ROS.  Chief Complaint  Patient presents with  . Shoulder Pain    Rachel [name] is a 33 y.o. female that is presenting with left shoulder pain.  The pain is been ongoing for 3 to 4 months.  She denies any inciting event.  The pain is occurring over the lateral aspect of the shoulder.  She feels the pain most with any abduction moves.  She has to do a fair amount of lifting at work.  She did have some improvement when she took prednisone.  She denies any radicular symptoms.  No history of similar pain.  Pain is moderate to severe.  Pain is sharp and stabbing.   Review of Systems  Constitutional: Negative for fever.  HENT: Negative for congestion.   Respiratory: Negative for cough.   Cardiovascular: Negative for chest pain.  Gastrointestinal: Negative for abdominal distention.  Musculoskeletal: Negative for joint swelling.  Skin: Negative for color change.  Neurological: Negative for weakness.  Hematological: Negative for adenopathy.  Psychiatric/Behavioral: Negative for agitation.    HISTORY: Past Medical, Surgical, Social, and Family History Reviewed & Updated per EMR.   Pertinent Historical Findings include:  Past Medical History:  Diagnosis Date  . Abnormal Pap smear   . Anemia    1st pregnancy  . GERD (gastroesophageal reflux disease)    pregnancy related  . Headache   . Hx of migraines   . Hypertension   . Multiple gestation with malpresentation of one fetus or more 10/05/2012  . Pregnancy induced hypertension   . S/P cesarean section 10/06/2012  . Shortness of breath    with bronchitis  . Transient hypertension of pregnancy, with delivery 10/19/2012  . 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 10/05/2012    Past Surgical History:  Procedure Laterality Date  . BILATERAL SALPINGECTOMY Bilateral 10/06/2012   Procedure: BILATERAL SALPINGECTOMY;   Surgeon: Sherron MondayJody Bovard, MD;  Location: [facility];  Service: Gynecology;  Laterality: Bilateral;  . CARPAL TUNNEL RELEASE Bilateral 04/12/2014   Procedure: RIGHT CARPAL TUNNEL RELEASE, LEFT CARPAL TUNNEL RELEASE;  Surgeon: Cindee SaltGary Kuzma, MD;  Location: [facility];  Service: Orthopedics;  Laterality: Bilateral;  . CESAREAN SECTION N/A 10/06/2012   Procedure: CESAREAN SECTION;  Surgeon: Sherron MondayJody Bovard, MD;  Location: [facility];  Service: Obstetrics;  Laterality: N/A;  PRIMARY C/S-TWINS  . TUBAL LIGATION    . WISDOM TOOTH EXTRACTION      No Known Allergies  Family History  Problem Relation Age of Onset  . Hypertension Mother   . Asthma Mother   . Hypertension Father   . Cancer Father        esophageal   . Hypertension Maternal Grandmother   . Diabetes Maternal Grandmother   . Cancer Maternal Grandfather   . Cancer Paternal Grandmother   . Hypertension Paternal Grandmother      Social History   Socioeconomic History  . Marital status: Single    Spouse name: Not on file  . Number of children: 3  . Years of education: 9314  . Highest education level: Not on file  Occupational History  . Occupation: Advertising account plannerroduction operator  Social Needs  . Financial resource strain: Not on file  . Food insecurity:    Worry: Not on file    Inability: Not on file  . Transportation needs:    Medical: Not on file  Non-medical: Not on file  Tobacco Use  . Smoking status: Former Smoker    Packs/day: 0.15    Years: 3.00    Pack years: 0.45    Types: Cigarettes  . Smokeless tobacco: Never Used  . Tobacco comment: quit 2008  Substance and Sexual Activity  . Alcohol use: Yes    Alcohol/week: 4.0 standard drinks    Types: 2 Cans of beer, 2 Shots of liquor per week    Comment: drinks on weekends  . Drug use: No  . Sexual activity: Yes    Partners: Male    Birth control/protection: Surgical  Lifestyle  . Physical activity:    Days per week: Not on file    Minutes per session: Not on file  .  Stress: Not on file  Relationships  . Social connections:    Talks on phone: Not on file    Gets together: Not on file    Attends religious service: Not on file    Active member of club or organization: Not on file    Attends meetings of clubs or organizations: Not on file    Relationship status: Not on file  . Intimate partner violence:    Fear of current or ex partner: Not on file    Emotionally abused: Not on file    Physically abused: Not on file    Forced sexual activity: Not on file  Other Topics Concern  . Not on file  Social History Narrative   [place]. Currently resides in a townhouse 3 children and boyfriend. No pets. Fun: ride a motorcycle.   Denies religious beliefs to effect health care.    Denies abuse and feels safe at home.    Drinks caffeine daily      PHYSICAL EXAM:  VS: BP 140/86   Pulse 88   Temp 98.5 F (36.9 C) (Oral)   Ht 5\' 6"  (1.676 m)   Wt 151 lb (68.5 kg)   SpO2 100%   BMI 24.37 kg/m  Physical Exam Gen: NAD, alert, cooperative with exam, well-appearing ENT: normal lips, normal nasal mucosa,  Eye: normal EOM, normal conjunctiva and lids CV:  no edema, +2 pedal pulses   Resp: no accessory muscle use, non-labored,  Skin: no rashes, no areas of induration  Neuro: normal tone, normal sensation to touch Psych:  normal insight, alert and oriented MSK:  Left shoulder: Normal active flexion and abduction. Normal external rotation. Normal strength resistance with internal and external rotation. Pain with empty can testing and Hawkins testing. Normal speeds test. Negative drop arm test. Neurovascular intact  Limited ultrasound: Left shoulder:  Normal-appearing biceps tendon. Normal-appearing subscapularis and static and dynamic testing. Supraspinatus appears to have mucus changes representative tendinopathy and a mild subacromial bursitis.  Mild impingement observed on dynamic testing  Summary: Findings  suggestive of supraspinatus tendinopathy and subacromial bursitis  Ultrasound and interpretation by Clare GandyJeremy Lemmie Steinhaus, MD      ASSESSMENT & PLAN:   Chronic left shoulder pain Appears to have tendinopathy changes on ultrasound and also has a mild bursitis.  Do not appreciate any tears on exam today. -Duexis and Pennsaid -Counseled on home exercise therapy and supportive care. -Provided a work note with some restrictions with lifting and repetitive maneuvers. -We will follow-up in 3 weeks to reevaluate.  May need to consider imaging, injection or physical therapy.

## 2018-08-24 NOTE — Assessment & Plan Note (Signed)
Appears to have tendinopathy changes on ultrasound and also has a mild bursitis.  Do not appreciate any tears on exam today. -Duexis and Pennsaid -Counseled on home exercise therapy and supportive care. -Provided a work note with some restrictions with lifting and repetitive maneuvers. -We will follow-up in 3 weeks to reevaluate.  May need to consider imaging, injection or physical therapy.

## 2018-08-30 ENCOUNTER — Telehealth: Payer: Self-pay | Admitting: Family

## 2018-08-30 ENCOUNTER — Ambulatory Visit: Payer: Self-pay | Admitting: *Deleted

## 2018-08-30 NOTE — Telephone Encounter (Signed)
Copied from CRM [id number]. Topic: Quick Communication - See Telephone Encounter >> Aug 30, 2018  5:45 PM Lorrine Kin, NT wrote: CRM for notification. See Telephone encounter for: 08/30/18.  Refer to Nurse Triage note from 08/30/2018.  Patient calling to check and see if she had missed a call from Dr Jordan Likes. States that she is still having pain and the medications are not helping. Would like to know if there is anything else she could try to alleviate the pain? Please advise. Patient works 3rd shift. States that she can be reached back in the morning before 11am.

## 2018-08-30 NOTE — Telephone Encounter (Signed)
Dr. Jordan Likes, pt is requesting a call from you. Please advise

## 2018-08-30 NOTE — Telephone Encounter (Signed)
Summary: Continuous Shoulder Pain   Pt is still having pain in her shoulder. The ibuprofen and cream is not helping as she is still having sharp pains. She would like to know if there is anything else that can be done prior to her next appt. Please advise [phone]     Called patient back regarding the continuous pain in her left shoulder. She is on light duty at work but she is still having sharp pains in her arm.  She is taking the ibuprofen and using the diclofenac sodium as prescribed.  Her arms throbs all the time. She is requesting for something else to help her until she comes back in to her appointment. She is requesting a call back by Dr. Jordan Likes. She is scheduled to see him on 09/14/2018. Routing to flow at [facility] [place] [facility] (pt of [facility], [place])

## 2018-08-31 ENCOUNTER — Telehealth: Payer: Self-pay | Admitting: Family Medicine

## 2018-08-31 NOTE — Telephone Encounter (Signed)
Left VM for patient. If she calls back please have her speak with a nurse/CMA and inform that we could try an injection or physical therapy next. The [facility] can report results to patient.   If any questions then please take the best time and phone number to call and I will try to call her back.   Myra Rude, MD [facility] 08/31/2018, 10:14 AM

## 2018-08-31 NOTE — Telephone Encounter (Signed)
Patient returned call and informed of message below patient voice understanding and states she will think about what route to take concerning PT referral or injection. (patient stated unsure if insurance will cover PT, advised patient to contact insurance regarding coverage)

## 2018-09-01 NOTE — Telephone Encounter (Signed)
Please advise 

## 2018-09-01 NOTE — Telephone Encounter (Signed)
Patient is calling to state if she can do the shot or a different medication- she stated prednisone  Has helped her in the past. She is still have pain.  And  When she seating still she still feels the pain. She is requesting call back.

## 2018-09-02 ENCOUNTER — Encounter: Payer: Self-pay | Admitting: Family

## 2018-09-02 ENCOUNTER — Ambulatory Visit (INDEPENDENT_AMBULATORY_CARE_PROVIDER_SITE_OTHER): Payer: BLUE CROSS/BLUE SHIELD | Admitting: Family

## 2018-09-02 VITALS — BP 126/76 | HR 72 | Temp 97.8°F | Ht 66.0 in | Wt 154.0 lb

## 2018-09-02 DIAGNOSIS — R5383 Other fatigue: Secondary | ICD-10-CM

## 2018-09-02 MED ORDER — DULOXETINE HCL 60 MG PO CPEP: 60.0000 mg | ORAL_CAPSULE | Freq: Every day | ORAL | 6 refills | Status: DC

## 2018-09-02 NOTE — Patient Instructions (Signed)
Once you finish your prescriptive Vitamin D, please start taking daily Vitamin D3 2000 daily.

## 2018-09-02 NOTE — Progress Notes (Signed)
Rachel [name] is a 33 y.o. female with the following history as recorded in EpicCare:  Patient Active Problem List   Diagnosis Date Noted  . Chronic left shoulder pain 08/24/2018  . Sinusitis 10/26/2016  . Influenza with respiratory manifestation other than pneumonia 10/22/2016  . Major depressive disorder, recurrent episode, moderate (HCC) 07/08/2016  . Shift work sleep disorder 04/03/2016  . Routine general medical examination at a health care facility 08/06/2015  . Severe depression (HCC) 01/09/2015  . Migraine with aura and without status migrainosus, not intractable 12/04/2014  . Migraine 09/14/2014  . Transient hypertension of pregnancy, with delivery 10/19/2012  . S/P cesarean section 10/06/2012  . 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 10/05/2012  . Multiple gestation with malpresentation of one fetus or more 10/05/2012    Current Outpatient Medications  Medication Sig Dispense Refill  . DULoxetine (CYMBALTA) 60 MG capsule Take 1 capsule (60 mg total) by mouth daily. 30 capsule 6  . ibuprofen (ADVIL,MOTRIN) 800 MG tablet TAKE 1 TABLET(800 MG) BY MOUTH EVERY 8 HOURS AS NEEDED 30 tablet 0  . Multiple Vitamin (MULTIVITAMIN WITH MINERALS) TABS tablet Take 2 tablets by mouth daily.    . Vitamin D, Ergocalciferol, (DRISDOL) 1.25 MG (50000 UT) CAPS capsule Take 1 capsule (50,000 Units total) by mouth every 7 (seven) days for 12 doses. 12 capsule 0  . DULoxetine (CYMBALTA) 30 MG capsule Take 1 capsule (30 mg total) by mouth daily for 30 doses. 30 capsule 4   No current facility-administered medications for this visit.     Allergies: Patient has no known allergies.  Past Medical History:  Diagnosis Date  . Abnormal Pap smear   . Anemia    1st pregnancy  . GERD (gastroesophageal reflux disease)    pregnancy related  . Headache   . Hx of migraines   . Hypertension   . Multiple gestation with malpresentation of one fetus or more 10/05/2012  . Pregnancy induced  hypertension   . S/P cesarean section 10/06/2012  . Shortness of breath    with bronchitis  . Transient hypertension of pregnancy, with delivery 10/19/2012  . 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 10/05/2012    Past Surgical History:  Procedure Laterality Date  . BILATERAL SALPINGECTOMY Bilateral 10/06/2012   Procedure: BILATERAL SALPINGECTOMY;  Surgeon: Sherron Monday, MD;  Location: [facility];  Service: Gynecology;  Laterality: Bilateral;  . CARPAL TUNNEL RELEASE Bilateral 04/12/2014   Procedure: RIGHT CARPAL TUNNEL RELEASE, LEFT CARPAL TUNNEL RELEASE;  Surgeon: Cindee Salt, MD;  Location: [facility];  Service: Orthopedics;  Laterality: Bilateral;  . CESAREAN SECTION N/A 10/06/2012   Procedure: CESAREAN SECTION;  Surgeon: Sherron Monday, MD;  Location: [facility];  Service: Obstetrics;  Laterality: N/A;  PRIMARY C/S-TWINS  . TUBAL LIGATION    . WISDOM TOOTH EXTRACTION      Family History  Problem Relation Age of Onset  . Hypertension Mother   . Asthma Mother   . Hypertension Father   . Cancer Father        esophageal   . Hypertension Maternal Grandmother   . Diabetes Maternal Grandmother   . Cancer Maternal Grandfather   . Cancer Paternal Grandmother   . Hypertension Paternal Grandmother     Social History   Tobacco Use  . Smoking status: Former Smoker    Packs/day: 0.15    Years: 3.00    Pack years: 0.45    Types: Cigarettes  . Smokeless tobacco:  Never Used  . Tobacco comment: quit 2008  Substance Use Topics  . Alcohol use: Yes    Alcohol/week: 4.0 standard drinks    Types: 2 Cans of beer, 2 Shots of liquor per week    Comment: drinks on weekends    Subjective:  Patient presents to follow-up on Cymbalta; does feel is offering some benefit but still complaining of "feeling scattered/ exhausted." Works 3rd shift/ may sleep 2-4 hours per day/ may get a nap before work; Husband works first shift;    Objective:  Vitals:   09/02/18 0901  BP: 126/76   Pulse: 72  Temp: 97.8 F (36.6 C)  TempSrc: Oral  SpO2: 99%  Weight: 154 lb (69.9 kg)  Height: 5\' 6"  (1.676 m)    General: Well developed, well nourished, in no acute distress  Skin : Warm and dry.  Head: Normocephalic and atraumatic  Lungs: Respirations unlabored;  Neurologic: Alert and oriented; speech intact; face symmetrical; moves all extremities well; CNII-XII intact without focal deficit   Assessment:  1. Other fatigue     Plan:  Reassurance that patient is doing a good job taking care of her children- tough to balance her work schedule and their needs; increase Cymbalta to 60 mg daily; encouraged to try and get more sleep if possible when she gets home from work.  She is encouraged to see her GYN and keep planned follow up with sports medicine.  No follow-ups on file.  No orders of the defined types were placed in this encounter.   Requested Prescriptions   Signed Prescriptions Disp Refills  . DULoxetine (CYMBALTA) 60 MG capsule 30 capsule 6    Sig: Take 1 capsule (60 mg total) by mouth daily.

## 2018-09-07 NOTE — Telephone Encounter (Signed)
Spoke with patient about shoulder pain.   Myra RudeSchmitz, Jeremy E, MD [facility] 09/07/2018, 11:42 AM

## 2018-09-14 ENCOUNTER — Ambulatory Visit: Payer: Self-pay | Admitting: Family Medicine

## 2018-09-14 ENCOUNTER — Encounter: Payer: Self-pay | Admitting: Family

## 2018-09-19 ENCOUNTER — Other Ambulatory Visit: Payer: Self-pay | Admitting: Family

## 2018-09-19 DIAGNOSIS — M791 Myalgia, unspecified site: Secondary | ICD-10-CM

## 2018-09-29 DIAGNOSIS — M19041 Primary osteoarthritis, right hand: Secondary | ICD-10-CM | POA: Diagnosis not present

## 2018-09-29 DIAGNOSIS — M19042 Primary osteoarthritis, left hand: Secondary | ICD-10-CM | POA: Diagnosis not present

## 2018-09-29 DIAGNOSIS — M7582 Other shoulder lesions, left shoulder: Secondary | ICD-10-CM | POA: Diagnosis not present

## 2018-09-29 DIAGNOSIS — M064 Inflammatory polyarthropathy: Secondary | ICD-10-CM | POA: Diagnosis not present

## 2018-09-29 DIAGNOSIS — M79642 Pain in left hand: Secondary | ICD-10-CM | POA: Diagnosis not present

## 2018-09-29 DIAGNOSIS — M791 Myalgia, unspecified site: Secondary | ICD-10-CM | POA: Diagnosis not present

## 2018-09-29 DIAGNOSIS — M25512 Pain in left shoulder: Secondary | ICD-10-CM | POA: Diagnosis not present

## 2018-10-04 ENCOUNTER — Encounter: Payer: Self-pay | Admitting: Internal Medicine

## 2018-10-04 ENCOUNTER — Ambulatory Visit (INDEPENDENT_AMBULATORY_CARE_PROVIDER_SITE_OTHER): Payer: BLUE CROSS/BLUE SHIELD | Admitting: Internal Medicine

## 2018-10-04 DIAGNOSIS — J111 Influenza due to unidentified influenza virus with other respiratory manifestations: Secondary | ICD-10-CM | POA: Diagnosis not present

## 2018-10-04 MED ORDER — BENZONATATE 200 MG PO CAPS: 200.0000 mg | ORAL_CAPSULE | Freq: Three times a day (TID) | ORAL | 0 refills | Status: DC | PRN

## 2018-10-04 MED ORDER — ALBUTEROL SULFATE HFA 108 (90 BASE) MCG/ACT IN AERS: 2.0000 | INHALATION_SPRAY | Freq: Four times a day (QID) | RESPIRATORY_TRACT | 0 refills | Status: DC | PRN

## 2018-10-04 MED ORDER — OSELTAMIVIR PHOSPHATE 75 MG PO CAPS: 75.0000 mg | ORAL_CAPSULE | Freq: Two times a day (BID) | ORAL | 0 refills | Status: AC

## 2018-10-04 NOTE — Patient Instructions (Addendum)
We have sent in the tamiflu to take 1 pill twice a day for 5 days.  We have sent in the albuterol inhaler to use for breathing.  We have sent in the cough medicine tessalon perles to use up to 3 times per day as needed.

## 2018-10-04 NOTE — Progress Notes (Signed)
   Subjective:   Patient ID: Rachel [name], female    DOB: 03-Feb-1986, 33 y.o.   MRN: [id number]  HPI The patient is a 33 y.o. female coming in for flu symptoms. Started Sunday evening. Main symptoms are: fevers, chills, headache, bodyaches. Denies dizziness. She is not eating or drinking well in the last 1-2 days. Overall it is worsening. Has tried tylenol and mucinex pm and last took tylenol containing product about 2 hours prior to visit. Has sick contacts and multiple family members with flu positive.   Review of Systems  Constitutional: Positive for activity change, appetite change, chills, fatigue and fever. Negative for unexpected weight change.  HENT: Positive for congestion, postnasal drip, rhinorrhea and sinus pressure. Negative for ear discharge, ear pain, sinus pain, sneezing, sore throat, tinnitus, trouble swallowing and voice change.   Eyes: Negative.   Respiratory: Positive for cough. Negative for chest tightness, shortness of breath and wheezing.   Cardiovascular: Negative.   Gastrointestinal: Negative.   Musculoskeletal: Positive for myalgias.  Neurological: Positive for headaches.    Objective:  Physical Exam Constitutional:      Appearance: She is well-developed.  HENT:     Head: Normocephalic and atraumatic.     Comments: Oropharynx with redness and clear drainage, nose with swollen turbinates, TMs normal bilaterally.  Neck:     Musculoskeletal: Normal range of motion.     Thyroid: No thyromegaly.  Cardiovascular:     Rate and Rhythm: Normal rate and regular rhythm.  Pulmonary:     Effort: Pulmonary effort is normal. No respiratory distress.     Breath sounds: Normal breath sounds. No wheezing or rales.  Abdominal:     Palpations: Abdomen is soft.  Musculoskeletal:        General: Tenderness present.  Lymphadenopathy:     Cervical: No cervical adenopathy.  Skin:    General: Skin is warm and dry.  Neurological:     Mental Status: She is alert and  oriented to person, place, and time.     Vitals:   10/04/18 1300  BP: (!) 142/98  Pulse: 94  Temp: 99.2 F (37.3 C)  TempSrc: Oral  SpO2: 99%  Weight: 147 lb (66.7 kg)  Height: 5\' 6"  (1.676 m)    Assessment & Plan:

## 2018-10-04 NOTE — Assessment & Plan Note (Signed)
Rx for tamiflu, albuterol inhaler for SOB and tessalon perles for cough.

## 2018-10-06 ENCOUNTER — Encounter: Payer: Self-pay | Admitting: Family Medicine

## 2018-10-17 DIAGNOSIS — N946 Dysmenorrhea, unspecified: Secondary | ICD-10-CM | POA: Diagnosis not present

## 2018-10-17 DIAGNOSIS — N920 Excessive and frequent menstruation with regular cycle: Secondary | ICD-10-CM | POA: Diagnosis not present

## 2018-10-17 DIAGNOSIS — Z6823 Body mass index (BMI) 23.0-23.9, adult: Secondary | ICD-10-CM | POA: Diagnosis not present

## 2018-11-17 DIAGNOSIS — M791 Myalgia, unspecified site: Secondary | ICD-10-CM | POA: Diagnosis not present

## 2018-11-17 DIAGNOSIS — K219 Gastro-esophageal reflux disease without esophagitis: Secondary | ICD-10-CM | POA: Diagnosis not present

## 2018-11-17 DIAGNOSIS — M064 Inflammatory polyarthropathy: Secondary | ICD-10-CM | POA: Diagnosis not present

## 2018-11-17 DIAGNOSIS — I10 Essential (primary) hypertension: Secondary | ICD-10-CM | POA: Diagnosis not present

## 2018-12-28 ENCOUNTER — Ambulatory Visit (INDEPENDENT_AMBULATORY_CARE_PROVIDER_SITE_OTHER): Payer: BLUE CROSS/BLUE SHIELD | Admitting: Internal Medicine

## 2018-12-28 DIAGNOSIS — J309 Allergic rhinitis, unspecified: Secondary | ICD-10-CM | POA: Diagnosis not present

## 2018-12-28 DIAGNOSIS — J019 Acute sinusitis, unspecified: Secondary | ICD-10-CM

## 2018-12-28 DIAGNOSIS — F322 Major depressive disorder, single episode, severe without psychotic features: Secondary | ICD-10-CM

## 2018-12-28 MED ORDER — ALBUTEROL SULFATE HFA 108 (90 BASE) MCG/ACT IN AERS: 2.0000 | INHALATION_SPRAY | Freq: Four times a day (QID) | RESPIRATORY_TRACT | 2 refills | Status: AC | PRN

## 2018-12-28 MED ORDER — TRIAMCINOLONE ACETONIDE 55 MCG/ACT NA AERO: 2.0000 | INHALATION_SPRAY | Freq: Every day | NASAL | 12 refills | Status: AC

## 2018-12-28 MED ORDER — AZITHROMYCIN 250 MG PO TABS: ORAL_TABLET | ORAL | 1 refills | Status: DC

## 2018-12-28 NOTE — Progress Notes (Deleted)
   Subjective:    Patient ID: Rachel [name], female    DOB: 13-Oct-1985, 33 y.o.   MRN: [id number]  HPI    Review of Systems     Objective:   Physical Exam        Assessment & Plan:

## 2018-12-28 NOTE — Patient Instructions (Signed)
Please take all new medication as prescribed - the antibiotic, and nasacort for allergies  Please continue all other medications as before, and refills have been done if requested.  Please have the pharmacy call with any other refills you may need.  Please keep your appointments with your specialists as you may have planned  Please let us know the fax number for work, and we will send the note for out of work tonight

## 2019-01-01 ENCOUNTER — Encounter: Payer: Self-pay | Admitting: Internal Medicine

## 2019-01-01 NOTE — Progress Notes (Signed)
Patient ID: Rachel [name], female   DOB: June 13, 1986, 33 y.o.   MRN: [id number]  Virtual Visit via Video Note  I connected with Myalynn T Cost on 01/01/19 at  4:00 PM EDT by a video enabled telemedicine application and verified that I am speaking with the correct person using two identifiers.  Location: Patient: at home Provider: at office   I discussed the limitations of evaluation and management by telemedicine and the availability of in person appointments. The patient expressed understanding and agreed to proceed.  History of Present Illness:  Here with 2-3 days acute onset fever, facial pain, pressure, headache, general weakness and malaise, and greenish d/c, with mild ST and cough, but pt denies chest pain, wheezing, increased sob or doe, orthopnea, PND, increased LE swelling, palpitations, dizziness or syncope.  Does have several wks ongoing nasal allergy symptoms with clearish congestion, itch and sneezing, without fever, pain, ST, cough, swelling or wheezing.  Denies worsening depressive symptoms, suicidal ideation, or panic Past Medical History:  Diagnosis Date  . Abnormal Pap smear   . Anemia    1st pregnancy  . GERD (gastroesophageal reflux disease)    pregnancy related  . Headache   . Hx of migraines   . Hypertension   . Multiple gestation with malpresentation of one fetus or more 10/05/2012  . Pregnancy induced hypertension   . S/P cesarean section 10/06/2012  . Shortness of breath    with bronchitis  . Transient hypertension of pregnancy, with delivery 10/19/2012  . 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 10/05/2012   Past Surgical History:  Procedure Laterality Date  . BILATERAL SALPINGECTOMY Bilateral 10/06/2012   Procedure: BILATERAL SALPINGECTOMY;  Surgeon: Sherron MondayJody Bovard, MD;  Location: [facility];  Service: Gynecology;  Laterality: Bilateral;  . CARPAL TUNNEL RELEASE Bilateral 04/12/2014   Procedure: RIGHT CARPAL TUNNEL RELEASE, LEFT CARPAL TUNNEL  RELEASE;  Surgeon: Cindee SaltGary Kuzma, MD;  Location: [facility];  Service: Orthopedics;  Laterality: Bilateral;  . CESAREAN SECTION N/A 10/06/2012   Procedure: CESAREAN SECTION;  Surgeon: Sherron MondayJody Bovard, MD;  Location: [facility];  Service: Obstetrics;  Laterality: N/A;  PRIMARY C/S-TWINS  . TUBAL LIGATION    . WISDOM TOOTH EXTRACTION      reports that she has quit smoking. Her smoking use included cigarettes. She has a 0.45 pack-year smoking history. She has never used smokeless tobacco. She reports current alcohol use of about 4.0 standard drinks of alcohol per week. She reports that she does not use drugs. family history includes Asthma in her mother; Cancer in her father, maternal grandfather, and paternal grandmother; Diabetes in her maternal grandmother; Hypertension in her father, maternal grandmother, mother, and paternal grandmother. No Known Allergies Current Outpatient Medications on File Prior to Visit  Medication Sig Dispense Refill  . benzonatate (TESSALON) 200 MG capsule Take 1 capsule (200 mg total) by mouth 3 (three) times daily as needed. 60 capsule 0  . DULoxetine (CYMBALTA) 60 MG capsule Take 1 capsule (60 mg total) by mouth daily. 30 capsule 6  . ibuprofen (ADVIL,MOTRIN) 800 MG tablet TAKE 1 TABLET(800 MG) BY MOUTH EVERY 8 HOURS AS NEEDED 30 tablet 0  . Multiple Vitamin (MULTIVITAMIN WITH MINERALS) TABS tablet Take 2 tablets by mouth daily.    Marland Kitchen. oseltamivir (TAMIFLU) 75 MG capsule Take 1 capsule (75 mg total) by mouth 2 (two) times daily. 10 capsule 0  . DULoxetine (CYMBALTA) 30 MG capsule Take 1 capsule (30 mg total) by mouth daily for 30  doses. 30 capsule 4   No current facility-administered medications on file prior to visit.     Observations/Objective: Alert, mild ill appearing, NAD, appropriate mood and affect, resps normal, cn 2-12 intact, moves all 4s, no visible rash or swelling Lab Results  Component Value Date   WBC 4.6 06/28/2018   HGB 12.2 06/28/2018   HCT  37.1 06/28/2018   PLT 206.0 06/28/2018   GLUCOSE 87 06/28/2018   CHOL 127 08/06/2015   TRIG 53.0 08/06/2015   HDL 41.40 08/06/2015   LDLCALC 75 08/06/2015   ALT 6 06/28/2018   AST 10 06/28/2018   NA 142 06/28/2018   K 4.2 06/28/2018   CL 109 06/28/2018   CREATININE 0.82 06/28/2018   BUN 11 06/28/2018   CO2 27 06/28/2018   TSH 1.85 06/28/2018   HGBA1C 5.2 05/31/2014   Assessment and Plan: See notes  Follow Up Instructions: See notes   I discussed the assessment and treatment plan with the patient. The patient was provided an opportunity to ask questions and all were answered. The patient agreed with the plan and demonstrated an understanding of the instructions.   The patient was advised to call back or seek an in-person evaluation if the symptoms worsen or if the condition fails to improve as anticipated.  Oliver Barre, MD

## 2019-01-01 NOTE — Assessment & Plan Note (Signed)
Mild to mod, for antibx course,  to f/u any worsening symptoms or concerns 

## 2019-01-01 NOTE — Assessment & Plan Note (Signed)
Mild to mod, for nasacort asd,  to f/u any worsening symptoms or concerns 

## 2019-01-01 NOTE — Assessment & Plan Note (Signed)
stable overall by history and exam, recent data reviewed with pt, and pt to continue medical treatment as before,  to f/u any worsening symptoms or concerns  

## 2019-01-27 ENCOUNTER — Ambulatory Visit (INDEPENDENT_AMBULATORY_CARE_PROVIDER_SITE_OTHER): Payer: BC Managed Care – PPO | Admitting: Family

## 2019-01-27 ENCOUNTER — Encounter: Payer: Self-pay | Admitting: Family

## 2019-01-27 ENCOUNTER — Other Ambulatory Visit: Payer: Self-pay

## 2019-01-27 DIAGNOSIS — R634 Abnormal weight loss: Secondary | ICD-10-CM | POA: Diagnosis not present

## 2019-01-27 DIAGNOSIS — F419 Anxiety disorder, unspecified: Secondary | ICD-10-CM

## 2019-01-27 DIAGNOSIS — M791 Myalgia, unspecified site: Secondary | ICD-10-CM

## 2019-01-27 MED ORDER — DULOXETINE HCL 30 MG PO CPEP: 30.0000 mg | ORAL_CAPSULE | Freq: Every day | ORAL | 3 refills | Status: DC

## 2019-01-27 NOTE — Progress Notes (Signed)
Rachel [name] is a 33 y.o. female with the following history as recorded in EpicCare:  Patient Active Problem List   Diagnosis Date Noted  . Allergic rhinitis 12/28/2018  . Chronic left shoulder pain 08/24/2018  . Sinusitis 10/26/2016  . Influenza with respiratory manifestation other than pneumonia 10/22/2016  . Major depressive disorder, recurrent episode, moderate ([place]) 07/08/2016  . Shift work sleep disorder 04/03/2016  . Routine general medical examination at a health care facility 08/06/2015  . Severe depression ([place]) 01/09/2015  . Migraine with aura and without status migrainosus, not intractable 12/04/2014  . Transient hypertension of pregnancy, with delivery 10/19/2012  . S/P cesarean section 10/06/2012  . 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 10/05/2012  . Multiple gestation with malpresentation of one fetus or more 10/05/2012    Current Outpatient Medications  Medication Sig Dispense Refill  . hydroxychloroquine (PLAQUENIL) 200 MG tablet Take by mouth 2 (two) times daily.    . predniSONE (DELTASONE) 10 MG tablet Take 10 mg by mouth daily with breakfast.    . albuterol (VENTOLIN HFA) 108 (90 Base) MCG/ACT inhaler Inhale 2 puffs into the lungs every 6 (six) hours as needed for wheezing or shortness of breath. 1 Inhaler 2  . DULoxetine (CYMBALTA) 30 MG capsule Take 1 capsule (30 mg total) by mouth daily. 30 capsule 3  . ibuprofen (ADVIL,MOTRIN) 800 MG tablet TAKE 1 TABLET(800 MG) BY MOUTH EVERY 8 HOURS AS NEEDED 30 tablet 0  . Multiple Vitamin (MULTIVITAMIN WITH MINERALS) TABS tablet Take 2 tablets by mouth daily.    Marland Kitchen oseltamivir (TAMIFLU) 75 MG capsule Take 1 capsule (75 mg total) by mouth 2 (two) times daily. 10 capsule 0  . triamcinolone (NASACORT) 55 MCG/ACT AERO nasal inhaler Place 2 sprays into the nose daily. 1 Inhaler 12   No current facility-administered medications for this visit.     Allergies: Patient has no known allergies.  Past Medical  History:  Diagnosis Date  . Abnormal Pap smear   . Anemia    1st pregnancy  . GERD (gastroesophageal reflux disease)    pregnancy related  . Headache   . Hx of migraines   . Hypertension   . Multiple gestation with malpresentation of one fetus or more 10/05/2012  . Pregnancy induced hypertension   . S/P cesarean section 10/06/2012  . Shortness of breath    with bronchitis  . Transient hypertension of pregnancy, with delivery 10/19/2012  . 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 10/05/2012    Past Surgical History:  Procedure Laterality Date  . BILATERAL SALPINGECTOMY Bilateral 10/06/2012   Procedure: BILATERAL SALPINGECTOMY;  Surgeon: Thornell Sartorius, MD;  Location: [facility];  Service: Gynecology;  Laterality: Bilateral;  . CARPAL TUNNEL RELEASE Bilateral 04/12/2014   Procedure: RIGHT CARPAL TUNNEL RELEASE, LEFT CARPAL TUNNEL RELEASE;  Surgeon: Daryll Brod, MD;  Location: [facility];  Service: Orthopedics;  Laterality: Bilateral;  . CESAREAN SECTION N/A 10/06/2012   Procedure: CESAREAN SECTION;  Surgeon: Thornell Sartorius, MD;  Location: [facility];  Service: Obstetrics;  Laterality: N/A;  PRIMARY C/S-TWINS  . TUBAL LIGATION    . WISDOM TOOTH EXTRACTION      Family History  Problem Relation Age of Onset  . Hypertension Mother   . Asthma Mother   . Hypertension Father   . Cancer Father        esophageal   . Hypertension Maternal Grandmother   . Diabetes Maternal Grandmother   . Cancer Maternal  Grandfather   . Cancer Paternal Grandmother   . Hypertension Paternal Grandmother     Social History   Tobacco Use  . Smoking status: Former Smoker    Packs/day: 0.15    Years: 3.00    Pack years: 0.45    Types: Cigarettes  . Smokeless tobacco: Never Used  . Tobacco comment: quit 2008  Substance Use Topics  . Alcohol use: Yes    Alcohol/week: 4.0 standard drinks    Types: 2 Cans of beer, 2 Shots of liquor per week    Comment: drinks on weekends    Subjective:     I connected with Rachel [name] on 01/27/19 at  9:40 AM EDT by a video enabled telemedicine application and verified that I am speaking with the correct person using two identifiers. Patient and I are the only 2 people on this phone call.    I discussed the limitations of evaluation and management by telemedicine and the availability of in person appointments. The patient expressed understanding and agreed to proceed.  Patient presents with concerns for unexplained weight loss- she is not actually weighing herself but "feels like I am losing weight because her clothes are fitting more loosely." Her job is physical in nature but is very concerned that "something else is going on." When I last saw her in January, she weighed at 154 pounds; by February, she weighed 147 pounds.  Admits that anxiety level has been up- has stopped her Cymbalta in the past 3 months; feels like her appetite has been down recently; Notes that her pain is better since starting the medication prescribed by her rheumatologist in March;   LMP- last week   Objective:  There were no vitals filed for this visit.  General: Well developed, well nourished, in no acute distress  Skin : Warm and dry.  Head: Normocephalic and atraumatic  Lungs: Respirations unlabored;  Neurologic: Alert and oriented; speech intact; face symmetrical;    Assessment:  1. Weight loss   2. Anxiety   3. Myalgia     Plan:  Update labs including CBC, CMP, TSH and CXR today; suspect anxiety component- will re-start Cymbalta 30 mg; would be helpful if patient could weigh herself at home; she is also encouraged to reach out to her rheumatologist about potential side effects of Plaquenil; may need to consider abdominal imaging and/or GI evaluation; follow-up to be determined.   No follow-ups on file.  Orders Placed This Encounter  Procedures  . DG Chest 2 View    Standing Status:   Future    Standing Expiration Date:   03/28/2020    Order  Specific Question:   Reason for Exam (SYMPTOM  OR DIAGNOSIS REQUIRED)    Answer:   weight loss    Order Specific Question:   Is patient pregnant?    Answer:   No    Order Specific Question:   Preferred imaging location?    Answer:   [facility]    Order Specific Question:   Radiology Contrast Protocol - do NOT remove file path    Answer:   _0 [email or web address]  . CBC w/Diff    Standing Status:   Future    Standing Expiration Date:   01/27/2020  . Comp Met (CMET)    Standing Status:   Future    Standing Expiration Date:   01/27/2020  . TSH    Standing Status:   Future    Standing Expiration Date:   01/27/2020  Requested Prescriptions   Signed Prescriptions Disp Refills  . DULoxetine (CYMBALTA) 30 MG capsule 30 capsule 3    Sig: Take 1 capsule (30 mg total) by mouth daily.

## 2019-01-30 ENCOUNTER — Ambulatory Visit: Payer: BLUE CROSS/BLUE SHIELD | Admitting: Family

## 2019-04-13 ENCOUNTER — Other Ambulatory Visit: Payer: Self-pay | Admitting: Family

## 2019-04-13 MED ORDER — DULOXETINE HCL 30 MG PO CPEP: 30.0000 mg | ORAL_CAPSULE | Freq: Every day | ORAL | 3 refills | Status: DC

## 2019-04-13 NOTE — Telephone Encounter (Signed)
Patient would like a refill on her DULoxetine (CYMBALTA) 30 MG capsule medication and have it sent to her preferred pharmacy [facility] [address].  She would like this pharmacy for ALL future refills.

## 2019-04-14 MED ORDER — DULOXETINE HCL 30 MG PO CPEP: 30.0000 mg | ORAL_CAPSULE | Freq: Every day | ORAL | 0 refills | Status: DC

## 2019-04-14 NOTE — Telephone Encounter (Addendum)
90 day supply required, please advise. Pt is out of medicine, pt gets sick when she goes too many days without taking meds.    [id number] [address], [place] [address]  Phone: [phone] Fax: [phone]   Best contact: [phone]

## 2019-04-14 NOTE — Addendum Note (Signed)
Addended by: Dimple Nanas on: 04/14/2019 11:19 AM   Modules accepted: Orders

## 2019-08-07 ENCOUNTER — Other Ambulatory Visit: Payer: Self-pay | Admitting: Family

## 2020-02-08 DIAGNOSIS — N946 Dysmenorrhea, unspecified: Secondary | ICD-10-CM | POA: Diagnosis not present

## 2020-02-08 DIAGNOSIS — F331 Major depressive disorder, recurrent, moderate: Secondary | ICD-10-CM | POA: Diagnosis not present

## 2020-02-08 DIAGNOSIS — Z0000 Encounter for general adult medical examination without abnormal findings: Secondary | ICD-10-CM | POA: Diagnosis not present

## 2020-02-08 DIAGNOSIS — Z124 Encounter for screening for malignant neoplasm of cervix: Secondary | ICD-10-CM | POA: Diagnosis not present

## 2020-02-08 DIAGNOSIS — Z113 Encounter for screening for infections with a predominantly sexual mode of transmission: Secondary | ICD-10-CM | POA: Diagnosis not present

## 2020-02-08 DIAGNOSIS — Z13220 Encounter for screening for lipoid disorders: Secondary | ICD-10-CM | POA: Diagnosis not present

## 2020-03-07 DIAGNOSIS — F331 Major depressive disorder, recurrent, moderate: Secondary | ICD-10-CM | POA: Diagnosis not present

## 2020-03-07 DIAGNOSIS — G43009 Migraine without aura, not intractable, without status migrainosus: Secondary | ICD-10-CM | POA: Diagnosis not present

## 2020-03-07 DIAGNOSIS — F419 Anxiety disorder, unspecified: Secondary | ICD-10-CM | POA: Diagnosis not present

## 2020-03-25 DIAGNOSIS — Z309 Encounter for contraceptive management, unspecified: Secondary | ICD-10-CM | POA: Diagnosis not present

## 2020-03-25 DIAGNOSIS — R87610 Atypical squamous cells of undetermined significance on cytologic smear of cervix (ASC-US): Secondary | ICD-10-CM | POA: Diagnosis not present

## 2020-03-25 DIAGNOSIS — N920 Excessive and frequent menstruation with regular cycle: Secondary | ICD-10-CM | POA: Diagnosis not present

## 2020-03-25 DIAGNOSIS — N946 Dysmenorrhea, unspecified: Secondary | ICD-10-CM | POA: Diagnosis not present

## 2020-03-28 DIAGNOSIS — Z01818 Encounter for other preprocedural examination: Secondary | ICD-10-CM | POA: Diagnosis not present

## 2020-03-28 DIAGNOSIS — N920 Excessive and frequent menstruation with regular cycle: Secondary | ICD-10-CM | POA: Diagnosis not present

## 2020-03-28 DIAGNOSIS — N946 Dysmenorrhea, unspecified: Secondary | ICD-10-CM | POA: Diagnosis not present

## 2020-06-13 DIAGNOSIS — Z309 Encounter for contraceptive management, unspecified: Secondary | ICD-10-CM | POA: Diagnosis not present

## 2020-06-13 DIAGNOSIS — N946 Dysmenorrhea, unspecified: Secondary | ICD-10-CM | POA: Diagnosis not present

## 2020-06-13 DIAGNOSIS — N920 Excessive and frequent menstruation with regular cycle: Secondary | ICD-10-CM | POA: Diagnosis not present

## 2020-06-26 DIAGNOSIS — J209 Acute bronchitis, unspecified: Secondary | ICD-10-CM | POA: Diagnosis not present

## 2020-06-26 DIAGNOSIS — J029 Acute pharyngitis, unspecified: Secondary | ICD-10-CM | POA: Diagnosis not present

## 2020-06-26 DIAGNOSIS — J0100 Acute maxillary sinusitis, unspecified: Secondary | ICD-10-CM | POA: Diagnosis not present

## 2020-06-26 DIAGNOSIS — J309 Allergic rhinitis, unspecified: Secondary | ICD-10-CM | POA: Diagnosis not present

## 2020-12-03 ENCOUNTER — Other Ambulatory Visit: Payer: Self-pay

## 2020-12-03 ENCOUNTER — Ambulatory Visit
Admission: RE | Admit: 2020-12-03 | Discharge: 2020-12-03 | Disposition: A | Discharge status: Home / Self Care | Payer: BC Managed Care – PPO | Source: Ambulatory Visit

## 2020-12-03 VITALS — BP 138/93 | HR 76 | Temp 98.3°F | Resp 16

## 2020-12-03 DIAGNOSIS — J069 Acute upper respiratory infection, unspecified: Secondary | ICD-10-CM

## 2020-12-03 DIAGNOSIS — R059 Cough, unspecified: Secondary | ICD-10-CM

## 2020-12-03 MED ORDER — PROMETHAZINE-DM 6.25-15 MG/5ML PO SYRP: 5.0000 mL | ORAL_SOLUTION | Freq: Four times a day (QID) | ORAL | 0 refills | Status: AC | PRN

## 2020-12-03 MED ORDER — PREDNISONE 20 MG PO TABS: 40.0000 mg | ORAL_TABLET | Freq: Every day | ORAL | 0 refills | Status: DC

## 2020-12-03 MED ORDER — LEVOCETIRIZINE DIHYDROCHLORIDE 5 MG PO TABS: 5.0000 mg | ORAL_TABLET | Freq: Every evening | ORAL | 0 refills | Status: AC

## 2020-12-03 NOTE — ED Triage Notes (Signed)
Pt presents with cough, sore throat, SOB, and chest pressure and headache xs 4 days.   States using inhaler 3 xs a day with 4 puffs at a time.

## 2020-12-03 NOTE — ED Provider Notes (Signed)
[facility]    CSN: [id number] Arrival date & time: 12/03/20  0957      History   Chief Complaint Chief Complaint  Patient presents with  . Shortness of Breath  . Sore Throat  . Cough  . Chest Pain    HPI Rachel [name] is a 35 y.o. female.   HPI  Patient presents with URI symptoms including cough, sore throat, nasal congestion, runny nose, chest congestion and sinus pressure.  She has attempted with relief with multiple OTC medications without relief. No history of asthma or recurrent bronchitis. She is a nonsmoker. Afebrile.   Past Medical History:  Diagnosis Date  . Abnormal Pap smear   . Anemia    1st pregnancy  . GERD (gastroesophageal reflux disease)    pregnancy related  . Headache   . Hx of migraines   . Hypertension   . Multiple gestation with malpresentation of one fetus or more 10/05/2012  . Pregnancy induced hypertension   . S/P cesarean section 10/06/2012  . Shortness of breath    with bronchitis  . Transient hypertension of pregnancy, with delivery 10/19/2012  . 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 10/05/2012    Patient Active Problem List   Diagnosis Date Noted  . Allergic rhinitis 12/28/2018  . Chronic left shoulder pain 08/24/2018  . Sinusitis 10/26/2016  . Influenza with respiratory manifestation other than pneumonia 10/22/2016  . Major depressive disorder, recurrent episode, moderate ([facility]) 07/08/2016  . Shift work sleep disorder 04/03/2016  . Routine general medical examination at a health care facility 08/06/2015  . Severe depression ([facility]) 01/09/2015  . Migraine with aura and without status migrainosus, not intractable 12/04/2014  . Transient hypertension of pregnancy, with delivery 10/19/2012  . S/P cesarean section 10/06/2012  . 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 10/05/2012  . Multiple gestation with malpresentation of one fetus or more 10/05/2012    Past Surgical History:   Procedure Laterality Date  . BILATERAL SALPINGECTOMY Bilateral 10/06/2012   Procedure: BILATERAL SALPINGECTOMY;  Surgeon: Sherron Monday, MD;  Location: [facility];  Service: Gynecology;  Laterality: Bilateral;  . CARPAL TUNNEL RELEASE Bilateral 04/12/2014   Procedure: RIGHT CARPAL TUNNEL RELEASE, LEFT CARPAL TUNNEL RELEASE;  Surgeon: Cindee Salt, MD;  Location: [facility];  Service: Orthopedics;  Laterality: Bilateral;  . CESAREAN SECTION N/A 10/06/2012   Procedure: CESAREAN SECTION;  Surgeon: Sherron Monday, MD;  Location: [facility];  Service: Obstetrics;  Laterality: N/A;  PRIMARY C/S-TWINS  . TUBAL LIGATION    . WISDOM TOOTH EXTRACTION      OB History    Gravida  2   Para  2   Term  2   Preterm      AB      Living  3     SAB      IAB      Ectopic      Multiple  1   Live Births  2            Home Medications    Prior to Admission medications   Medication Sig Start Date End Date Taking? Authorizing Provider  levocetirizine (XYZAL) 5 MG tablet Take 1 tablet (5 mg total) by mouth every evening. 12/03/20  Yes Bing Neighbors, FNP  predniSONE (DELTASONE) 20 MG tablet Take 2 tablets (40 mg total) by mouth daily with breakfast. 12/03/20  Yes Bing Neighbors, FNP  promethazine-dextromethorphan (PROMETHAZINE-DM) 6.25-15 MG/5ML syrup Take 5  mLs by mouth 4 (four) times daily as needed for cough. 12/03/20  Yes Bing Neighbors, FNP  albuterol (VENTOLIN HFA) 108 (90 Base) MCG/ACT inhaler Inhale 2 puffs into the lungs every 6 (six) hours as needed for wheezing or shortness of breath. 12/28/18   Corwin Levins, MD  DULoxetine (CYMBALTA) 30 MG capsule TAKE 1 CAPSULE BY MOUTH EVERY DAY 08/07/19   Olive Bass, FNP  hydroxychloroquine (PLAQUENIL) 200 MG tablet Take by mouth 2 (two) times daily.    [provider]  ibuprofen (ADVIL,MOTRIN) 800 MG tablet TAKE 1 TABLET(800 MG) BY MOUTH EVERY 8 HOURS AS NEEDED 08/18/18   Olive Bass, FNP  Multiple  Vitamin (MULTIVITAMIN WITH MINERALS) TABS tablet Take 2 tablets by mouth daily.    [provider]  oseltamivir (TAMIFLU) 75 MG capsule Take 1 capsule (75 mg total) by mouth 2 (two) times daily. 10/04/18   Myrlene Broker, MD  sertraline (ZOLOFT) 25 MG tablet Take 25 mg by mouth every morning. 11/19/20   [provider]  SUMAtriptan (IMITREX) 50 MG tablet Take 50 mg by mouth daily as needed. 08/02/20   [provider]  triamcinolone (NASACORT) 55 MCG/ACT AERO nasal inhaler Place 2 sprays into the nose daily. 12/28/18   Corwin Levins, MD    Family History Family History  Problem Relation Age of Onset  . Hypertension Mother   . Asthma Mother   . Hypertension Father   . Cancer Father        esophageal   . Hypertension Maternal Grandmother   . Diabetes Maternal Grandmother   . Cancer Maternal Grandfather   . Cancer Paternal Grandmother   . Hypertension Paternal Grandmother     Social History Social History   Tobacco Use  . Smoking status: Former Smoker    Packs/day: 0.15    Years: 3.00    Pack years: 0.45    Types: Cigarettes  . Smokeless tobacco: Never Used  . Tobacco comment: quit 2008  Substance Use Topics  . Alcohol use: Yes    Alcohol/week: 4.0 standard drinks    Types: 2 Cans of beer, 2 Shots of liquor per week    Comment: drinks on weekends  . Drug use: No     Allergies   Patient has no known allergies.   Review of Systems Review of Systems Pertinent negatives listed in HPI Physical Exam Triage Vital Signs ED Triage Vitals  Enc Vitals Group     BP 12/03/20 1026 (!) 138/93     Pulse Rate 12/03/20 1026 76     Resp 12/03/20 1026 16     Temp 12/03/20 1026 98.3 F (36.8 C)     Temp Source 12/03/20 1026 Oral     SpO2 12/03/20 1026 98 %     Weight --      Height --      Head Circumference --      Peak Flow --      Pain Score 12/03/20 1023 5     Pain Loc --      Pain Edu? --      Excl. in GC? --    No data found.  Updated  Vital Signs BP (!) 138/93 (BP Location: Left Arm)   Pulse 76   Temp 98.3 F (36.8 C) (Oral)   Resp 16   SpO2 98%   Visual Acuity Right Eye Distance:   Left Eye Distance:   Bilateral Distance:    Right Eye Near:  Left Eye Near:    Bilateral Near:     Physical Exam  General Appearance:    Alert, cooperative, no distress  HENT:   Normocephalic, ears normal, nares mucosal edema with congestion, rhinorrhea, oropharynx clear   Eyes:    PERRL, conjunctiva/corneas clear, EOM's intact       Lungs:     Clear to auscultation bilaterally, respirations unlabored  Heart:    Regular rate and rhythm  Neurologic:   Awake, alert, oriented x 3. No apparent focal neurological           defect.      UC Treatments / Results  Labs (all labs ordered are listed, but only abnormal results are displayed) Labs Reviewed - No data to display  EKG   Radiology No results found.  Procedures Procedures (including critical care time)  Medications Ordered in UC Medications - No data to display  Initial Impression / Assessment and Plan / UC Course  I have reviewed the triage vital signs and the nursing notes.  Pertinent labs & imaging results that were available during my care of the patient were reviewed by me and considered in my medical decision making (see chart for details).     Acute URI. No evidence of a bacterial etiology.  Symptom management warranted only.  Treatment per discharge medications. Recommended continuing Xyzal for prevention of URI symptoms associated with outdoor allergens.  Follow-up with PCP as needed. Final Clinical Impressions(s) / UC Diagnoses   Final diagnoses:  Acute URI  Cough   Discharge Instructions   None    ED Prescriptions    Medication Sig Dispense Auth. Provider   predniSONE (DELTASONE) 20 MG tablet Take 2 tablets (40 mg total) by mouth daily with breakfast. 10 tablet Bing Neighbors, FNP   promethazine-dextromethorphan (PROMETHAZINE-DM) 6.25-15  MG/5ML syrup Take 5 mLs by mouth 4 (four) times daily as needed for cough. 140 mL Bing Neighbors, FNP   levocetirizine (XYZAL) 5 MG tablet Take 1 tablet (5 mg total) by mouth every evening. 90 tablet Bing Neighbors, FNP     PDMP not reviewed this encounter.   Bing Neighbors, FNP 12/11/20 [phone]

## 2021-05-18 ENCOUNTER — Telehealth: Payer: BC Managed Care – PPO

## 2022-07-20 ENCOUNTER — Ambulatory Visit (INDEPENDENT_AMBULATORY_CARE_PROVIDER_SITE_OTHER): Payer: BC Managed Care – PPO

## 2022-07-20 ENCOUNTER — Ambulatory Visit (INDEPENDENT_AMBULATORY_CARE_PROVIDER_SITE_OTHER): Payer: BC Managed Care – PPO | Admitting: Podiatry

## 2022-07-20 VITALS — BP 148/113 | HR 71 | Temp 98.6°F | Resp 17

## 2022-07-20 DIAGNOSIS — M8560 Other cyst of bone, unspecified site: Secondary | ICD-10-CM

## 2022-07-20 DIAGNOSIS — M778 Other enthesopathies, not elsewhere classified: Secondary | ICD-10-CM

## 2022-07-20 DIAGNOSIS — M79672 Pain in left foot: Secondary | ICD-10-CM

## 2022-07-20 DIAGNOSIS — M722 Plantar fascial fibromatosis: Secondary | ICD-10-CM

## 2022-07-20 DIAGNOSIS — M79671 Pain in right foot: Secondary | ICD-10-CM | POA: Diagnosis not present

## 2022-07-20 MED ORDER — METHYLPREDNISOLONE 4 MG PO TBPK: ORAL_TABLET | ORAL | 0 refills | Status: AC

## 2022-07-20 NOTE — Progress Notes (Unsigned)
Subjective:   Patient ID: Rachel [name], female   DOB: 36 y.o.   MRN: [id number]   HPI Chief Complaint  Patient presents with   Bunions    Patient is having right foot bunion and lateral side of the foot pain, patient also have left foot pain while standing, rate of pain 10 out 10, patient stands at work for 10 hrs, X-rays taken today   This has been ongoing for a few months but over hte last few weeks it has been huring more. 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 When she goes to lay down and stand up she gets pain to the feet. She feels like something nees to be popped on the bunoin and the outside of the foot. No treatment. No injuries.    ROS      Objective:  Physical Exam  *** Arch pain, buion/tailors bunion, metatarsaliga    Assessment:  ***     Plan:  ***  Medrol, mIR, pads, orthotics

## 2022-07-20 NOTE — Patient Instructions (Signed)

## 2022-08-12 ENCOUNTER — Other Ambulatory Visit: Payer: Self-pay | Admitting: Podiatry

## 2022-08-12 DIAGNOSIS — M79671 Pain in right foot: Secondary | ICD-10-CM

## 2022-08-12 DIAGNOSIS — M8560 Other cyst of bone, unspecified site: Secondary | ICD-10-CM

## 2022-08-12 DIAGNOSIS — M778 Other enthesopathies, not elsewhere classified: Secondary | ICD-10-CM

## 2022-08-12 DIAGNOSIS — M722 Plantar fascial fibromatosis: Secondary | ICD-10-CM

## 2022-08-16 ENCOUNTER — Ambulatory Visit: Admit: 2022-08-16 | Payer: BC Managed Care – PPO

## 2022-08-17 ENCOUNTER — Ambulatory Visit
Admission: EM | Admit: 2022-08-17 | Discharge: 2022-08-17 | Disposition: A | Discharge status: Home / Self Care | Payer: BC Managed Care – PPO | Attending: Urgent Care | Admitting: Urgent Care

## 2022-08-17 ENCOUNTER — Ambulatory Visit (INDEPENDENT_AMBULATORY_CARE_PROVIDER_SITE_OTHER): Payer: BC Managed Care – PPO

## 2022-08-17 VITALS — BP 151/97 | HR 80 | Temp 98.0°F | Resp 16

## 2022-08-17 DIAGNOSIS — R252 Cramp and spasm: Secondary | ICD-10-CM | POA: Diagnosis not present

## 2022-08-17 DIAGNOSIS — M542 Cervicalgia: Secondary | ICD-10-CM | POA: Diagnosis not present

## 2022-08-17 DIAGNOSIS — S161XXA Strain of muscle, fascia and tendon at neck level, initial encounter: Secondary | ICD-10-CM

## 2022-08-17 MED ORDER — TIZANIDINE HCL 4 MG PO TABS: 4.0000 mg | ORAL_TABLET | Freq: Every day | ORAL | 0 refills | Status: AC

## 2022-08-17 MED ORDER — MELOXICAM 15 MG PO TABS: 15.0000 mg | ORAL_TABLET | Freq: Every day | ORAL | 1 refills | Status: AC

## 2022-08-17 MED ORDER — PREDNISONE 20 MG PO TABS: ORAL_TABLET | ORAL | 0 refills | Status: AC
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
## 2022-08-17 NOTE — Discharge Instructions (Signed)
Start the prednisone today. This is the stronger type of anti-inflammatory. Do not use anything else with it for pain except tizanidine, the muscle relaxant, and Tylenol. When you finish prednisone, you can use meloxicam as needed.

## 2022-08-18 ENCOUNTER — Encounter: Payer: Self-pay | Admitting: Podiatry

## 2022-08-19 ENCOUNTER — Ambulatory Visit
Admission: RE | Admit: 2022-08-19 | Discharge: 2022-08-19 | Disposition: A | Discharge status: Home / Self Care | Payer: BC Managed Care – PPO | Source: Ambulatory Visit | Attending: Podiatry | Admitting: Podiatry

## 2022-08-19 DIAGNOSIS — M778 Other enthesopathies, not elsewhere classified: Secondary | ICD-10-CM

## 2022-08-19 DIAGNOSIS — M8560 Other cyst of bone, unspecified site: Secondary | ICD-10-CM

## 2022-11-03 ENCOUNTER — Ambulatory Visit
Admission: EM | Admit: 2022-11-03 | Discharge: 2022-11-03 | Disposition: A | Discharge status: Home / Self Care | Payer: BC Managed Care – PPO | Attending: Nurse Practitioner | Admitting: Nurse Practitioner

## 2022-11-03 DIAGNOSIS — M542 Cervicalgia: Secondary | ICD-10-CM

## 2022-11-03 MED ORDER — NAPROXEN 500 MG PO TABS: 500.0000 mg | ORAL_TABLET | Freq: Two times a day (BID) | ORAL | 0 refills | Status: AC

## 2022-11-03 MED ORDER — CYCLOBENZAPRINE HCL 10 MG PO TABS: 10.0000 mg | ORAL_TABLET | Freq: Two times a day (BID) | ORAL | 0 refills | Status: AC | PRN

## 2022-11-03 NOTE — ED Triage Notes (Signed)
Pt states she take meloxicam for back pain and states it has not helped for neck pain.

## 2022-11-03 NOTE — Discharge Instructions (Signed)
Flexeril as needed.  Please note this medication can make you drowsy.  Do not drink alcohol or drive on this medication Naproxen twice daily for 7 days.  Take this with food.  Do not take meloxicam while you are taking naproxen Heat to the neck as needed Please follow-up with your PCP if your symptoms do not improve for further workup and treatment options of your symptoms Please go to the emergency room if you have any worsening symptoms
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
## 2023-08-21 ENCOUNTER — Emergency Department (HOSPITAL_BASED_OUTPATIENT_CLINIC_OR_DEPARTMENT_OTHER): Payer: BC Managed Care – PPO | Admitting: Radiology

## 2023-08-21 ENCOUNTER — Emergency Department (HOSPITAL_BASED_OUTPATIENT_CLINIC_OR_DEPARTMENT_OTHER)
Admission: EM | Admit: 2023-08-21 | Discharge: 2023-08-21 | Disposition: A | Discharge status: Home / Self Care | Payer: BC Managed Care – PPO | Attending: Emergency Medicine | Admitting: Emergency Medicine

## 2023-08-21 ENCOUNTER — Encounter (HOSPITAL_BASED_OUTPATIENT_CLINIC_OR_DEPARTMENT_OTHER): Payer: Self-pay | Admitting: Urology

## 2023-08-21 DIAGNOSIS — Z79899 Other long term (current) drug therapy: Secondary | ICD-10-CM | POA: Diagnosis not present

## 2023-08-21 DIAGNOSIS — I10 Essential (primary) hypertension: Secondary | ICD-10-CM | POA: Insufficient documentation

## 2023-08-21 DIAGNOSIS — R0789 Other chest pain: Secondary | ICD-10-CM | POA: Insufficient documentation

## 2023-08-21 DIAGNOSIS — R0602 Shortness of breath: Secondary | ICD-10-CM | POA: Diagnosis not present

## 2023-08-21 DIAGNOSIS — R079 Chest pain, unspecified: Secondary | ICD-10-CM | POA: Diagnosis present

## 2023-08-21 DIAGNOSIS — R6889 Other general symptoms and signs: Secondary | ICD-10-CM

## 2023-08-21 LAB — BASIC METABOLIC PANEL
Anion gap: 6 (ref 5–15)
BUN: 5 mg/dL — ABNORMAL LOW (ref 6–20)
CO2: 26 mmol/L (ref 22–32)
Calcium: 9 mg/dL (ref 8.9–10.3)
Chloride: 104 mmol/L (ref 98–111)
Creatinine, Ser: 0.82 mg/dL (ref 0.44–1.00)
GFR, Estimated: >60 mL/min (ref >60)
Glucose, Bld: 90 mg/dL (ref 70–99)
Potassium: 4 mmol/L (ref 3.5–5.1)
Sodium: 136 mmol/L (ref 135–145)

## 2023-08-21 LAB — CBC
HCT: 38 % (ref 36.0–46.0)
Hemoglobin: 12.9 g/dL (ref 12.0–15.0)
MCH: 29.6 pg (ref 26.0–34.0)
MCHC: 33.9 g/dL (ref 30.0–36.0)
MCV: 87.2 fL (ref 80.0–100.0)
Platelets: 216 10*3/uL (ref 150–400)
RBC: 4.36 MIL/uL (ref 3.87–5.11)
RDW: 13.9 % (ref 11.5–15.5)
WBC: 6.1 10*3/uL (ref 4.0–10.5)
nRBC: 0 % (ref 0.0–0.2)

## 2023-08-21 LAB — PREGNANCY, URINE: Preg Test, Ur: NEGATIVE

## 2023-08-21 LAB — TROPONIN I (HIGH SENSITIVITY): Troponin I (High Sensitivity): <2 ng/L (ref <18)

## 2023-08-21 MED ORDER — KETOROLAC TROMETHAMINE 30 MG/ML IJ SOLN
30.0000 mg | Freq: Once | INTRAMUSCULAR | Status: AC
  Administered 2023-08-21: 30 mg via INTRAVENOUS
  Filled 2023-08-21: qty 1

## 2023-08-21 NOTE — ED Notes (Signed)
 Pt refused COVID Flu RSV swab. Pt stated she was swabbed today at urgent care and everything came back neg.

## 2023-08-21 NOTE — ED Provider Notes (Signed)
 [place] EMERGENCY DEPARTMENT AT [facility] Provider Note   CSN: [id number] Arrival date & time: 08/21/23  1045     History  Chief Complaint  Patient presents with   Palpitations    Rachel [name] is a 38 y.o. female with history of hypertension, GERD, migraines, who presents to the emergency department complaining of chest pain and shortness of breath for the past 3 days.  Patient states that she was sent from urgent care with concern for abnormal EKG. she has been taking Mucinex DM for her symptoms.  States that she was tested for COVID and strep at urgent care and it was negative.   Palpitations Associated symptoms: chest pain and cough        Home Medications Prior to Admission medications   Medication Sig Start Date End Date Taking? Authorizing Provider  albuterol  (VENTOLIN  HFA) 108 (90 Base) MCG/ACT inhaler Inhale 2 puffs into the lungs every 6 (six) hours as needed for wheezing or shortness of breath. 12/28/18   Norleen Lynwood ORN, MD  cyclobenzaprine  (FLEXERIL ) 10 MG tablet Take 1 tablet (10 mg total) by mouth 2 (two) times daily as needed for muscle spasms. 11/03/22   Loreda Myla SAUNDERS, NP  DULoxetine  (CYMBALTA ) 30 MG capsule TAKE 1 CAPSULE BY MOUTH EVERY DAY Patient not taking: Reported on 07/20/2022 08/07/19   Jason Leita Repine, FNP  hydroxychloroquine (PLAQUENIL) 200 MG tablet Take by mouth 2 (two) times daily. Patient not taking: Reported on 07/20/2022    [provider]  levocetirizine (XYZAL ) 5 MG tablet Take 1 tablet (5 mg total) by mouth every evening. Patient not taking: Reported on 07/20/2022 12/03/20   Arloa Suzen RAMAN, NP  meloxicam  (MOBIC ) 15 MG tablet Take 1 tablet (15 mg total) by mouth daily. 08/17/22   Christopher Savannah, PA-C  methylPREDNISolone  (MEDROL  DOSEPAK) 4 MG TBPK tablet Take as directed 07/20/22   Gershon Donnice SAUNDERS, DPM  Multiple Vitamin (MULTIVITAMIN WITH MINERALS) TABS tablet Take 2 tablets by mouth daily.    [provider]   oseltamivir  (TAMIFLU ) 75 MG capsule Take 1 capsule (75 mg total) by mouth 2 (two) times daily. Patient not taking: Reported on 07/20/2022 10/04/18   Rollene Almarie LABOR, MD  predniSONE  (DELTASONE ) 20 MG tablet Take 2 tablets daily with breakfast. 08/17/22   Christopher Savannah, PA-C  promethazine -dextromethorphan (PROMETHAZINE -DM) 6.25-15 MG/5ML syrup Take 5 mLs by mouth 4 (four) times daily as needed for cough. Patient not taking: Reported on 07/20/2022 12/03/20   Arloa Suzen RAMAN, NP  sertraline  (ZOLOFT ) 25 MG tablet Take 25 mg by mouth every morning. Patient not taking: Reported on 07/20/2022 11/19/20   [provider]  SUMAtriptan  (IMITREX ) 50 MG tablet Take 50 mg by mouth daily as needed. Patient not taking: Reported on 07/20/2022 08/02/20   [provider]  tiZANidine  (ZANAFLEX ) 4 MG tablet Take 1 tablet (4 mg total) by mouth at bedtime. 08/17/22   Christopher Savannah, PA-C  triamcinolone  (NASACORT ) 55 MCG/ACT AERO nasal inhaler Place 2 sprays into the nose daily. Patient not taking: Reported on 07/20/2022 12/28/18   Norleen Lynwood ORN, MD      Allergies    Patient has no known allergies.    Review of Systems   Review of Systems  Constitutional:  Positive for chills.  HENT:  Positive for congestion and sore throat.   Respiratory:  Positive for cough.   Cardiovascular:  Positive for chest pain.  Gastrointestinal:  Positive for diarrhea.  Musculoskeletal:  Positive for myalgias.  Neurological:  Positive for headaches.  All other systems reviewed and are negative.   Physical Exam Updated Vital Signs BP (!) 140/99 (BP Location: Right Arm)   Pulse 82   Temp 98.2 F (36.8 C)   Resp 15   Ht 5' 6 (1.676 m)   Wt 66.7 kg   SpO2 99%   BMI 23.73 kg/m  Physical Exam Vitals and nursing note reviewed.  Constitutional:      Appearance: Normal appearance.  HENT:     Head: Normocephalic and atraumatic.     Nose: Congestion present.  Eyes:     Conjunctiva/sclera: Conjunctivae normal.   Cardiovascular:     Rate and Rhythm: Normal rate and regular rhythm.  Pulmonary:     Effort: Pulmonary effort is normal. No respiratory distress.     Breath sounds: Normal breath sounds.  Chest:     Comments: Anterior chest [place] reproducible tenderness to palpation, just left of the sternum Abdominal:     General: There is no distension.     Palpations: Abdomen is soft.     Tenderness: There is no abdominal tenderness.  Skin:    General: Skin is warm and dry.  Neurological:     General: No focal deficit present.     Mental Status: She is alert.     ED Results / Procedures / Treatments   Labs (all labs ordered are listed, but only abnormal results are displayed) Labs Reviewed  BASIC METABOLIC PANEL - Abnormal; Notable for the following components:      Result Value   BUN 5 (*)    All other components within normal limits  RESP PANEL BY RT-PCR (RSV, FLU A&B, COVID)  RVPGX2  CBC  PREGNANCY, URINE  TROPONIN I (HIGH SENSITIVITY)    EKG EKG Interpretation Date/Time:  Saturday August 21 2023 11:14:41 EST Ventricular Rate:  80 PR Interval:  142 QRS Duration:  90 QT Interval:  374 QTC Calculation: 431 R Axis:   83  Text Interpretation: Normal sinus rhythm with sinus arrhythmia Nonspecific T wave abnormality Abnormal ECG When compared with ECG of 02-Nov-2017 14:54, PREVIOUS ECG IS PRESENT since last tracing no significant change Confirmed by Lenor Hollering [phone]) on 08/21/2023 1:56:46 PM  Radiology DG Chest 2 View Result Date: 08/21/2023 CLINICAL DATA:  Chest pain EXAM: CHEST - 2 VIEW COMPARISON:  X-ray 11/02/2017 FINDINGS: The heart size and mediastinal contours are within normal limits. No consolidation, pneumothorax or effusion. No edema. The visualized skeletal structures are unremarkable. Overlapping artifacts. IMPRESSION: No acute cardiopulmonary disease. Electronically Signed   By: Ranell Bring M.D.   On: 08/21/2023 11:45    Procedures Procedures    Medications  Ordered in ED Medications  ketorolac  (TORADOL ) 30 MG/ML injection 30 mg (has no administration in time range)    ED Course/ Medical Decision Making/ A&P                                 Medical Decision Making Amount and/or Complexity of Data Reviewed Labs: ordered. Radiology: ordered.  This patient is a 38 y.o. female  who presents to the ED for concern of chest pain, cough, SOB x 3 days.   Differential diagnoses prior to evaluation: The emergent differential diagnosis includes, but is not limited to,  ACS, pericarditis, myocarditis, aortic dissection, PE, pneumothorax, esophageal spasm or rupture, chronic angina, pneumonia, bronchitis, GERD, reflux/PUD, biliary disease, pancreatitis, costochondritis, anxiety. This is not an exhaustive differential.  Past Medical History / Co-morbidities / Social History: hypertension, GERD, migraines  Additional history: Chart reviewed. Pertinent results include: Reviewed urgent care note, patient was sent with concern for elevated blood pressure and abnormal EKG.  Physical Exam: Physical exam performed. The pertinent findings include: Hypertensive to 140/99, otherwise normal vital signs.  Lab Tests/Imaging studies: I personally interpreted labs/imaging and the pertinent results include: CBC and BMP unremarkable.  Negative troponin. Urine pregnancy negative.   Chest x-ray without acute abnormalities. I agree with the radiologist interpretation.  Respiratory panel collected prior to patient discharge, she plans to follow up the results on MyChart.   Cardiac monitoring: EKG obtained and interpreted by myself and attending physician which shows: NSR with sinus arrhythmia, no significant change compared to prior   Medications: I ordered medication including toradol .  I have reviewed the patients home medicines and have made adjustments as needed.   Disposition: After consideration of the diagnostic results and the patients response to  treatment, I feel that emergency department workup does not suggest an emergent condition requiring admission or immediate intervention beyond what has been performed at this time. The plan is: Discharge to home.  Chest pain likely costochondritis or in the setting of viral respiratory infection.  Will obtain respiratory panel, patient plans to follow-up the results herself.  Low concern for emergent etiology for chest pain such as ACS, as she has had negative troponin and no acute ischemic changes on EKG.  She is PERC negative for PE.. The patient is safe for discharge and has been instructed to return immediately for worsening symptoms, change in symptoms or any other concerns.  Final Clinical Impression(s) / ED Diagnoses Final diagnoses:  Central chest pain  Flu-like symptoms    Rx / DC Orders ED Discharge Orders     None      Portions of this report may have been transcribed using voice recognition software. Every effort was made to ensure accuracy; however, inadvertent computerized transcription errors may be present.    Corie Friddie ONEIDA DEVONNA 08/21/23 1423    Lenor Hollering, MD 08/21/23 1434

## 2023-08-21 NOTE — ED Triage Notes (Signed)
 Pt sent from UC due to chest pain and SOB x 3 days  States feels better when laying on her back  EKG done at [facility]

## 2023-08-21 NOTE — Discharge Instructions (Addendum)
 You were seen in the emergency department today for chest pain, coughing.  We tested you for flu, COVID, and RSV and these results are pending; you can follow up the results on MyChart.    Make sure that you are drinking lots of fluids and getting plenty of rest. You can take decongestants as long as you take them with lots of water. You can use lozenges or chloraseptic spray as needed for sore throat.   You can continue the Mucinex you are taking, and if you are still having headache I recommend adding in ibuprofen .  Continue to monitor how you are doing, and return to the emergency department for new or worsening symptoms such as chest pain, difficulty breathing not related to coughing, fever despite medication, or persistent vomiting or diarrhea.  Your EKG looked the same compared to prior.   It has been a pleasure taking care of you today and I hope you begin to feel better soon!

## 2024-08-19 ENCOUNTER — Emergency Department (HOSPITAL_COMMUNITY)
Admission: EM | Admit: 2024-08-19 | Discharge: 2024-08-20 | Disposition: A | Attending: Emergency Medicine | Admitting: Emergency Medicine

## 2024-08-19 ENCOUNTER — Emergency Department (HOSPITAL_COMMUNITY)

## 2024-08-19 ENCOUNTER — Ambulatory Visit
Admission: EM | Admit: 2024-08-19 | Discharge: 2024-08-19 | Disposition: A | Discharge status: Home / Self Care | Attending: Family Medicine | Admitting: Family Medicine

## 2024-08-19 DIAGNOSIS — R519 Headache, unspecified: Secondary | ICD-10-CM

## 2024-08-19 DIAGNOSIS — Z79899 Other long term (current) drug therapy: Secondary | ICD-10-CM | POA: Insufficient documentation

## 2024-08-19 DIAGNOSIS — I160 Hypertensive urgency: Secondary | ICD-10-CM | POA: Diagnosis not present

## 2024-08-19 DIAGNOSIS — G43909 Migraine, unspecified, not intractable, without status migrainosus: Secondary | ICD-10-CM | POA: Insufficient documentation

## 2024-08-19 DIAGNOSIS — I10 Essential (primary) hypertension: Secondary | ICD-10-CM | POA: Diagnosis not present

## 2024-08-19 DIAGNOSIS — R29818 Other symptoms and signs involving the nervous system: Secondary | ICD-10-CM

## 2024-08-19 LAB — COMPREHENSIVE METABOLIC PANEL WITH GFR
ALT: 18 U/L (ref 0–44)
AST: 24 U/L (ref 15–41)
Albumin: 4.2 g/dL (ref 3.5–5.0)
Alkaline Phosphatase: 50 U/L (ref 38–126)
Anion gap: 10 (ref 5–15)
BUN: 11 mg/dL (ref 6–20)
CO2: 23 mmol/L (ref 22–32)
Calcium: 9.4 mg/dL (ref 8.9–10.3)
Chloride: 108 mmol/L (ref 98–111)
Creatinine, Ser: 0.75 mg/dL (ref 0.44–1.00)
GFR, Estimated: >60 mL/min
Glucose, Bld: 100 mg/dL — ABNORMAL HIGH (ref 70–99)
Potassium: 3.7 mmol/L (ref 3.5–5.1)
Sodium: 141 mmol/L (ref 135–145)
Total Bilirubin: 0.5 mg/dL (ref 0.0–1.2)
Total Protein: 7.7 g/dL (ref 6.5–8.1)

## 2024-08-19 LAB — CBC
HCT: 40 % (ref 36.0–46.0)
Hemoglobin: 13.7 g/dL (ref 12.0–15.0)
MCH: 30 pg (ref 26.0–34.0)
MCHC: 34.3 g/dL (ref 30.0–36.0)
MCV: 87.5 fL (ref 80.0–100.0)
Platelets: 243 K/uL (ref 150–400)
RBC: 4.57 MIL/uL (ref 3.87–5.11)
RDW: 13 % (ref 11.5–15.5)
WBC: 4.6 K/uL (ref 4.0–10.5)
nRBC: 0 % (ref 0.0–0.2)

## 2024-08-19 LAB — URINALYSIS, ROUTINE W REFLEX MICROSCOPIC
Bilirubin Urine: NEGATIVE
Glucose, UA: NEGATIVE mg/dL
Hgb urine dipstick: NEGATIVE
Ketones, ur: NEGATIVE mg/dL
Leukocytes,Ua: NEGATIVE
Nitrite: NEGATIVE
Protein, ur: NEGATIVE mg/dL
Specific Gravity, Urine: 1.018 (ref 1.005–1.030)
pH: 6 (ref 5.0–8.0)

## 2024-08-19 LAB — HCG, SERUM, QUALITATIVE: Preg, Serum: NEGATIVE

## 2024-08-19 LAB — LIPASE, BLOOD: Lipase: 17 U/L (ref 11–51)

## 2024-08-19 MED ORDER — METOCLOPRAMIDE HCL 5 MG/ML IJ SOLN
10.0000 mg | Freq: Once | INTRAMUSCULAR | Status: AC
  Administered 2024-08-20: 10 mg via INTRAVENOUS
  Filled 2024-08-19: qty 2

## 2024-08-19 MED ORDER — DIPHENHYDRAMINE HCL 50 MG/ML IJ SOLN
12.5000 mg | Freq: Once | INTRAMUSCULAR | Status: AC
  Administered 2024-08-20: 12.5 mg via INTRAVENOUS
  Filled 2024-08-19: qty 1

## 2024-08-19 MED ORDER — SODIUM CHLORIDE 0.9 % IV BOLUS
1000.0000 mL | Freq: Once | INTRAVENOUS | Status: AC
  Administered 2024-08-19: 1000 mL via INTRAVENOUS

## 2024-08-19 MED ORDER — KETOROLAC TROMETHAMINE 15 MG/ML IJ SOLN
15.0000 mg | Freq: Once | INTRAMUSCULAR | Status: AC
  Administered 2024-08-20: 15 mg via INTRAVENOUS
  Filled 2024-08-19: qty 1

## 2024-08-19 MED ORDER — AMLODIPINE BESYLATE 5 MG PO TABS
5.0000 mg | ORAL_TABLET | Freq: Once | ORAL | Status: AC
  Administered 2024-08-20: 5 mg via ORAL
  Filled 2024-08-19: qty 1

## 2024-08-19 NOTE — Discharge Instructions (Signed)
 Please head to the emergency room as you are in need of a higher level of care than we can provide in the urgent care setting. This includes rule out of acute encephalopathy given your severe headache, severely elevated blood pressure readings and uncontrolled high blood pressure.

## 2024-08-19 NOTE — ED Provider Notes (Signed)
 " [facility]  Note:  This document was prepared using Conservation officer, historic buildings and may include unintentional dictation errors.  MRN: [id number] DOB: [birth year]  Subjective:   Rachel [name] is a 39 y.o. female presenting for 2-week history of persistent coughing, 1 week history of malaise, fatigue, severe headaches.  Has started to feel worse in the past days including nausea, photophobia, decreased appetite. Has a history of HTN, last took her bp medications in early 2025.  Has a history of migraines as well but has not had to take medications for this recently.  No history of stroke or neurologic disorders.  No confusion, weakness, numbness or tingling.  Current Outpatient Medications  Medication Instructions   albuterol  (VENTOLIN  HFA) 108 (90 Base) MCG/ACT inhaler 2 puffs, Inhalation, Every 6 hours PRN   cyclobenzaprine  (FLEXERIL ) 10 mg, Oral, 2 times daily PRN   dextromethorphan-guaiFENesin (MUCINEX DM) 30-600 MG 12hr tablet 1 tablet, 2 times daily   DULoxetine  (CYMBALTA ) 30 MG capsule TAKE 1 CAPSULE BY MOUTH EVERY DAY   hydroxychloroquine (PLAQUENIL) 200 MG tablet 2 times daily   levocetirizine (XYZAL ) 5 mg, Oral, Every evening   medroxyPROGESTERone  (DEPO-PROVERA ) 150 mg, Every 3 months   meloxicam  (MOBIC ) 15 mg, Oral, Daily   methylPREDNISolone  (MEDROL  DOSEPAK) 4 MG TBPK tablet Take as directed   Multiple Vitamin (MULTIVITAMIN WITH MINERALS) TABS tablet 2 tablets, Oral, Daily   oseltamivir  (TAMIFLU ) 75 mg, Oral, 2 times daily   predniSONE  (DELTASONE ) 20 MG tablet Take 2 tablets daily with breakfast.   promethazine -dextromethorphan (PROMETHAZINE -DM) 6.25-15 MG/5ML syrup 5 mLs, Oral, 4 times daily PRN   Pseudoeph-Doxylamine-DM-APAP (NYQUIL PO) Take by mouth.   sertraline  (ZOLOFT ) 25 mg, Every morning   SUMAtriptan  (IMITREX ) 50 mg, Daily PRN   tiZANidine  (ZANAFLEX ) 4 mg, Oral, Daily at bedtime   triamcinolone  (NASACORT ) 55 MCG/ACT AERO nasal  inhaler 2 sprays, Nasal, Daily    Allergies[1]  Past Medical History:  Diagnosis Date   Abnormal Pap smear    Anemia    1st pregnancy   GERD (gastroesophageal reflux disease)    pregnancy related   Headache    Hx of migraines    Hypertension    Multiple gestation with malpresentation of one fetus or more 10/05/2012   Pregnancy induced hypertension    S/P cesarean section 10/06/2012   Shortness of breath    with bronchitis   Transient hypertension of pregnancy, with delivery 10/19/2012   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 10/05/2012     Past Surgical History:  Procedure Laterality Date   BILATERAL SALPINGECTOMY Bilateral 10/06/2012   Procedure: BILATERAL SALPINGECTOMY;  Surgeon: Ezzie Marshall, MD;  Location: [facility];  Service: Gynecology;  Laterality: Bilateral;   CARPAL TUNNEL RELEASE Bilateral 04/12/2014   Procedure: RIGHT CARPAL TUNNEL RELEASE, LEFT CARPAL TUNNEL RELEASE;  Surgeon: Arley Curia, MD;  Location: [facility];  Service: Orthopedics;  Laterality: Bilateral;   CESAREAN SECTION N/A 10/06/2012   Procedure: CESAREAN SECTION;  Surgeon: Ezzie Marshall, MD;  Location: [facility];  Service: Obstetrics;  Laterality: N/A;  PRIMARY C/S-TWINS   TUBAL LIGATION     WISDOM TOOTH EXTRACTION      Family History  Problem Relation Age of Onset   Hypertension Mother    Asthma Mother    Hypertension Father    Cancer Father        esophageal    Hypertension Maternal Grandmother    Diabetes Maternal Grandmother    Cancer  Maternal Grandfather    Cancer Paternal Grandmother    Hypertension Paternal Grandmother     Social History   Occupational History   Occupation: Advertising account planner  Tobacco Use   Smoking status: Former    Current packs/day: 0.15    Average packs/day: 0.2 packs/day for 3.0 years (0.5 ttl pk-yrs)    Types: Cigarettes   Smokeless tobacco: Never   Tobacco comments:    quit 2008  Substance and Sexual Activity   Alcohol use: Yes     Alcohol/week: 4.0 standard drinks of alcohol    Types: 2 Cans of beer, 2 Shots of liquor per week    Comment: drinks on weekends   Drug use: No   Sexual activity: Yes    Partners: Male    Birth control/protection: Surgical, Injection     ROS   Objective:   Vitals: BP (!) 194/106 (BP Location: Left Arm)   Pulse 73   Temp 98.7 F (37.1 C) (Oral)   Resp 16   SpO2 99%   BP Readings from Last 3 Encounters:  08/19/24 (!) 194/106  08/21/23 (!) 145/97  11/03/22 (!) 153/111    Physical Exam Constitutional:      General: She is not in acute distress.    Appearance: Normal appearance. She is well-developed. She is not ill-appearing, toxic-appearing or diaphoretic.  HENT:     Head: Normocephalic and atraumatic.     Right Ear: External ear normal.     Left Ear: External ear normal.     Nose: Nose normal.     Mouth/Throat:     Mouth: Mucous membranes are moist.     Pharynx: No oropharyngeal exudate or posterior oropharyngeal erythema.  Eyes:     General: No scleral icterus.       Right eye: No discharge.        Left eye: No discharge.     Extraocular Movements: Extraocular movements intact.     Comments: No facial asymmetry. Positive Romberg, negative pronator drift.   Cardiovascular:     Rate and Rhythm: Normal rate and regular rhythm.     Heart sounds: Normal heart sounds. No murmur heard.    No friction rub. No gallop.  Pulmonary:     Effort: Pulmonary effort is normal. No respiratory distress.     Breath sounds: No stridor. No wheezing, rhonchi or rales.  Chest:     Chest Nan: No tenderness.  Skin:    General: Skin is warm and dry.  Neurological:     General: No focal deficit present.     Mental Status: She is alert and oriented to person, place, and time.     Cranial Nerves: No cranial nerve deficit.     Motor: No weakness.     Coordination: Coordination normal.     Gait: Gait normal.     Comments: Romberg positive.  No facial asymmetry.  Negative pronator  drift.  Psychiatric:        Mood and Affect: Mood normal.        Behavior: Behavior normal.     Assessment and Plan :   PDMP not reviewed this encounter.  1. Hypertensive urgency   2. Severe headache   3. Romberg test positive      Patient has severely elevated blood pressures and neurologic symptoms, severe headache, positive Romberg and as such recommend higher level of care than we can provide in the urgent care setting.  This includes rule out of hypertensive emergency/urgency, acute encephalopathy,  aneurysm, stroke.  also on the differential is sinusitis, bacterial upper respiratory infection and migraine.  Unfortunately, she cannot adequately get treated for the latter differential without safely ruling out acute encephalopathy and addressing her blood pressure in a controlled setting.  Discussed this with patient and her mother, will present to the emergency room now for emergency level care.    [1] No Known Allergies    Christopher Savannah, PA-C 08/19/24 1505  "

## 2024-08-19 NOTE — ED Triage Notes (Signed)
 Patient c/o nausea and headache  Starting christmas day  Went to urgent care today and says they were not able to treat her Patient has congestion Started on mucinex today Says her bp is elevated and not able to get it down, has not had any treatment for bp

## 2024-08-19 NOTE — ED Provider Notes (Signed)
 " [place] EMERGENCY DEPARTMENT AT [facility] Provider Note   CSN: [id number] Arrival date & time: 08/19/24  1515     Patient presents with: Headache   Rachel [name] is a 39 y.o. female.   Patient complains of a headache and nausea that started on Christmas Day.  Patient has a history of migraine headaches and is followed by neurology.  Patient went to urgent care today because of her headache and was found to be hypertensive.  Patient states that she has a history of hypertension but her doctor stopped her medications and she is not currently on any medication for hypertension.  Patient was sent here for evaluation due to her headache and high blood pressure.  Patient reports that she has not had any fever she has not had any chills.  Patient is nauseated she is not vomiting.  Patient denies any chest pain she denies any abdominal pain.  The history is provided by the patient and a parent. No language interpreter was used.  Headache      Prior to Admission medications  Medication Sig Start Date End Date Taking? Authorizing Provider  albuterol  (VENTOLIN  HFA) 108 (90 Base) MCG/ACT inhaler Inhale 2 puffs into the lungs every 6 (six) hours as needed for wheezing or shortness of breath. 12/28/18   Norleen Lynwood ORN, MD  cyclobenzaprine  (FLEXERIL ) 10 MG tablet Take 1 tablet (10 mg total) by mouth 2 (two) times daily as needed for muscle spasms. 11/03/22   Mayer, Jodi R, NP  dextromethorphan-guaiFENesin (MUCINEX DM) 30-600 MG 12hr tablet Take 1 tablet by mouth 2 (two) times daily.    [provider]  DULoxetine  (CYMBALTA ) 30 MG capsule TAKE 1 CAPSULE BY MOUTH EVERY DAY Patient not taking: Reported on 07/20/2022 08/07/19   Jason Leita Repine, FNP  hydroxychloroquine (PLAQUENIL) 200 MG tablet Take by mouth 2 (two) times daily. Patient not taking: Reported on 07/20/2022    [provider]  levocetirizine (XYZAL ) 5 MG tablet Take 1 tablet (5 mg total) by mouth every  evening. Patient not taking: Reported on 07/20/2022 12/03/20   Arloa Suzen RAMAN, NP  medroxyPROGESTERone  (DEPO-PROVERA ) 150 MG/ML injection Inject 150 mg into the muscle every 3 (three) months.    [provider]  meloxicam  (MOBIC ) 15 MG tablet Take 1 tablet (15 mg total) by mouth daily. 08/17/22   Christopher Savannah, PA-C  methylPREDNISolone  (MEDROL  DOSEPAK) 4 MG TBPK tablet Take as directed 07/20/22   Gershon Donnice SAUNDERS, DPM  Multiple Vitamin (MULTIVITAMIN WITH MINERALS) TABS tablet Take 2 tablets by mouth daily.    [provider]  oseltamivir  (TAMIFLU ) 75 MG capsule Take 1 capsule (75 mg total) by mouth 2 (two) times daily. Patient not taking: Reported on 07/20/2022 10/04/18   Rollene Almarie LABOR, MD  predniSONE  (DELTASONE ) 20 MG tablet Take 2 tablets daily with breakfast. 08/17/22   Christopher Savannah, PA-C  promethazine -dextromethorphan (PROMETHAZINE -DM) 6.25-15 MG/5ML syrup Take 5 mLs by mouth 4 (four) times daily as needed for cough. Patient not taking: Reported on 07/20/2022 12/03/20   Arloa Suzen RAMAN, NP  Pseudoeph-Doxylamine-DM-APAP (NYQUIL PO) Take by mouth.    [provider]  sertraline  (ZOLOFT ) 25 MG tablet Take 25 mg by mouth every morning. Patient not taking: Reported on 07/20/2022 11/19/20   [provider]  SUMAtriptan  (IMITREX ) 50 MG tablet Take 50 mg by mouth daily as needed. Patient not taking: Reported on 07/20/2022 08/02/20   [provider]  tiZANidine  (ZANAFLEX ) 4 MG tablet Take 1 tablet (  4 mg total) by mouth at bedtime. 08/17/22   Christopher Savannah, PA-C  triamcinolone  (NASACORT ) 55 MCG/ACT AERO nasal inhaler Place 2 sprays into the nose daily. Patient not taking: Reported on 07/20/2022 12/28/18   Norleen Lynwood ORN, MD    Allergies: Patient has no known allergies.    Review of Systems  Neurological:  Positive for headaches.  All other systems reviewed and are negative.   Updated Vital Signs BP (!) 164/94 (BP Location: Left Arm)   Pulse (!) 57   Temp  98.3 F (36.8 C) (Oral)   Resp 16   Ht 5' 3 (1.6 m)   Wt 58.5 kg   LMP 07/30/2024   SpO2 100%   BMI 22.85 kg/m   Physical Exam Vitals and nursing note reviewed.  Constitutional:      Appearance: She is well-developed.  HENT:     Head: Normocephalic.  Eyes:     Extraocular Movements: Extraocular movements intact.     Pupils: Pupils are equal, round, and reactive to light.  Cardiovascular:     Rate and Rhythm: Normal rate and regular rhythm.  Pulmonary:     Effort: Pulmonary effort is normal.  Abdominal:     General: There is no distension.  Musculoskeletal:        General: Normal range of motion.     Cervical back: Normal range of motion.  Skin:    General: Skin is warm.  Neurological:     General: No focal deficit present.     Mental Status: She is alert and oriented to person, place, and time.  Psychiatric:        Mood and Affect: Mood normal.        Behavior: Behavior normal.     (all labs ordered are listed, but only abnormal results are displayed) Labs Reviewed  COMPREHENSIVE METABOLIC PANEL WITH GFR - Abnormal; Notable for the following components:      Result Value   Glucose, Bld 100 (*)    All other components within normal limits  URINALYSIS, ROUTINE W REFLEX MICROSCOPIC - Abnormal; Notable for the following components:   APPearance CLOUDY (*)    All other components within normal limits  LIPASE, BLOOD  CBC  HCG, SERUM, QUALITATIVE    EKG: None  Radiology: CT Head Wo Contrast Result Date: 08/19/2024 EXAM: CT HEAD WITHOUT 08/19/2024 10:53:40 PM TECHNIQUE: CT of the head was performed without the administration of intravenous contrast. Automated exposure control, iterative reconstruction, and/or weight based adjustment of the mA/kV was utilized to reduce the radiation dose to as low as reasonably achievable. COMPARISON: 10/22/2011 CLINICAL HISTORY: Headache, new onset (Age >= 51y) FINDINGS: BRAIN AND VENTRICLES: No acute intracranial hemorrhage. No mass  effect or midline shift. No extra-axial fluid collection. No evidence of acute infarct. No hydrocephalus. ORBITS: No acute abnormality. SINUSES AND MASTOIDS: Air-fluid level in the right maxillary sinus compatible with acute sinusitis. SOFT TISSUES AND SKULL: No acute skull fracture. No acute soft tissue abnormality. IMPRESSION: 1. No acute intracranial abnormality. 2. Acute right maxillary  sinusitis. Electronically signed by: Franky Crease MD 08/19/2024 11:11 PM EST RP Workstation: [id number]     Procedures   Medications Ordered in the ED  amLODipine  (NORVASC ) tablet 5 mg (5 mg Oral Patient Refused/Not Given 08/19/24 2319)  ketorolac  (TORADOL ) 15 MG/ML injection 15 mg (has no administration in time range)  metoCLOPramide  (REGLAN ) injection 10 mg (has no administration in time range)  diphenhydrAMINE  (BENADRYL ) injection 12.5 mg (has no administration in time range)  sodium chloride  0.9 % bolus 1,000 mL (1,000 mLs Intravenous New Bag/Given 08/19/24 2305)                                    Medical Decision Making Patient complains of a headache.  Patient went to urgent care for evaluation of her headache and was found to be hypertensive.  Patient is not currently taking any blood pressure medicines  Amount and/or Complexity of Data Reviewed Independent Historian: parent    Details: Patient is here with her mother who is supportive Labs: ordered. Decision-making details documented in ED Course.    Details: Labs ordered reviewed and interpreted patient has normal white blood cell count chemistries are normal. Radiology: ordered and independent interpretation performed. Decision-making details documented in ED Course.    Details: CT head shows no acute findings.  Risk Prescription drug management. Risk Details: Patient is given IV fluids x 1 L.  Toradol  Benadryl  and Reglan .  Patient is given amlodipine  for hypertension.  Patient is given a prescription for amlodipine  she is advised to follow-up  with her primary care physician for recheck.  Patient is discharged in stable condition.        Final diagnoses:  Migraine without status migrainosus, not intractable, unspecified migraine type  Primary hypertension    ED Discharge Orders          Ordered    amLODipine  (NORVASC ) 5 MG tablet  Daily        08/20/24 0006          An After Visit Summary was printed and given to the patient.      Flint Sonny POUR, PA-C 08/20/24 0007    Charlyn Sora, MD 08/20/24 1559  "

## 2024-08-19 NOTE — ED Provider Notes (Incomplete)
 " [place] EMERGENCY DEPARTMENT AT [facility] Provider Note   CSN: [id number] Arrival date & time: 08/19/24  1515     Patient presents with: Headache   Rachel [name] is a 39 y.o. female.  {Add pertinent medical, surgical, social history, OB history to YEP:67052} Patient complains of a headache and nausea that started on Christmas Day.  Patient has a history of migraine headaches and is followed by neurology.  Patient went to urgent care today because of her headache and was found to be hypertensive.  Patient states that she has a history of hypertension but her doctor stopped her medications and she is not currently on any medication for hypertension.  Patient was sent here for evaluation due to her headache and high blood pressure.  Patient reports that she has not had any fever she has not had any chills.  Patient is nauseated she is not vomiting.  Patient denies any chest pain she denies any abdominal pain.  The history is provided by the patient and a parent. No language interpreter was used.  Headache      Prior to Admission medications  Medication Sig Start Date End Date Taking? Authorizing Provider  albuterol  (VENTOLIN  HFA) 108 (90 Base) MCG/ACT inhaler Inhale 2 puffs into the lungs every 6 (six) hours as needed for wheezing or shortness of breath. 12/28/18   Norleen Lynwood ORN, MD  cyclobenzaprine  (FLEXERIL ) 10 MG tablet Take 1 tablet (10 mg total) by mouth 2 (two) times daily as needed for muscle spasms. 11/03/22   Mayer, Jodi R, NP  dextromethorphan-guaiFENesin (MUCINEX DM) 30-600 MG 12hr tablet Take 1 tablet by mouth 2 (two) times daily.    [provider]  DULoxetine  (CYMBALTA ) 30 MG capsule TAKE 1 CAPSULE BY MOUTH EVERY DAY Patient not taking: Reported on 07/20/2022 08/07/19   Jason Leita Repine, FNP  hydroxychloroquine (PLAQUENIL) 200 MG tablet Take by mouth 2 (two) times daily. Patient not taking: Reported on 07/20/2022    [provider]   levocetirizine (XYZAL ) 5 MG tablet Take 1 tablet (5 mg total) by mouth every evening. Patient not taking: Reported on 07/20/2022 12/03/20   Arloa Suzen RAMAN, NP  medroxyPROGESTERone  (DEPO-PROVERA ) 150 MG/ML injection Inject 150 mg into the muscle every 3 (three) months.    [provider]  meloxicam  (MOBIC ) 15 MG tablet Take 1 tablet (15 mg total) by mouth daily. 08/17/22   Christopher Savannah, PA-C  methylPREDNISolone  (MEDROL  DOSEPAK) 4 MG TBPK tablet Take as directed 07/20/22   Gershon Donnice SAUNDERS, DPM  Multiple Vitamin (MULTIVITAMIN WITH MINERALS) TABS tablet Take 2 tablets by mouth daily.    [provider]  oseltamivir  (TAMIFLU ) 75 MG capsule Take 1 capsule (75 mg total) by mouth 2 (two) times daily. Patient not taking: Reported on 07/20/2022 10/04/18   Rollene Almarie LABOR, MD  predniSONE  (DELTASONE ) 20 MG tablet Take 2 tablets daily with breakfast. 08/17/22   Christopher Savannah, PA-C  promethazine -dextromethorphan (PROMETHAZINE -DM) 6.25-15 MG/5ML syrup Take 5 mLs by mouth 4 (four) times daily as needed for cough. Patient not taking: Reported on 07/20/2022 12/03/20   Arloa Suzen RAMAN, NP  Pseudoeph-Doxylamine-DM-APAP (NYQUIL PO) Take by mouth.    [provider]  sertraline  (ZOLOFT ) 25 MG tablet Take 25 mg by mouth every morning. Patient not taking: Reported on 07/20/2022 11/19/20   [provider]  SUMAtriptan  (IMITREX ) 50 MG tablet Take 50 mg by mouth daily as needed. Patient not taking: Reported on 07/20/2022 08/02/20   [provider]  tiZANidine  (ZANAFLEX ) 4 MG tablet Take 1 tablet (4 mg total) by mouth at bedtime. 08/17/22   Christopher Savannah, PA-C  triamcinolone  (NASACORT ) 55 MCG/ACT AERO nasal inhaler Place 2 sprays into the nose daily. Patient not taking: Reported on 07/20/2022 12/28/18   Norleen Lynwood ORN, MD    Allergies: Patient has no known allergies.    Review of Systems  Neurological:  Positive for headaches.  All other systems reviewed and are  negative.   Updated Vital Signs BP (!) 164/94 (BP Location: Left Arm)   Pulse (!) 57   Temp 98.3 F (36.8 C) (Oral)   Resp 16   Ht 5' 3 (1.6 m)   Wt 58.5 kg   LMP 07/30/2024   SpO2 100%   BMI 22.85 kg/m   Physical Exam Vitals and nursing note reviewed.  Constitutional:      Appearance: She is well-developed.  HENT:     Head: Normocephalic.  Eyes:     Extraocular Movements: Extraocular movements intact.     Pupils: Pupils are equal, round, and reactive to light.  Cardiovascular:     Rate and Rhythm: Normal rate and regular rhythm.  Pulmonary:     Effort: Pulmonary effort is normal.  Abdominal:     General: There is no distension.  Musculoskeletal:        General: Normal range of motion.     Cervical back: Normal range of motion.  Skin:    General: Skin is warm.  Neurological:     General: No focal deficit present.     Mental Status: She is alert and oriented to person, place, and time.  Psychiatric:        Mood and Affect: Mood normal.        Behavior: Behavior normal.     (all labs ordered are listed, but only abnormal results are displayed) Labs Reviewed  COMPREHENSIVE METABOLIC PANEL WITH GFR - Abnormal; Notable for the following components:      Result Value   Glucose, Bld 100 (*)    All other components within normal limits  URINALYSIS, ROUTINE W REFLEX MICROSCOPIC - Abnormal; Notable for the following components:   APPearance CLOUDY (*)    All other components within normal limits  LIPASE, BLOOD  CBC  HCG, SERUM, QUALITATIVE    EKG: None  Radiology: CT Head Wo Contrast Result Date: 08/19/2024 EXAM: CT HEAD WITHOUT 08/19/2024 10:53:40 PM TECHNIQUE: CT of the head was performed without the administration of intravenous contrast. Automated exposure control, iterative reconstruction, and/or weight based adjustment of the mA/kV was utilized to reduce the radiation dose to as low as reasonably achievable. COMPARISON: 10/22/2011 CLINICAL HISTORY:  Headache, new onset (Age >= 51y) FINDINGS: BRAIN AND VENTRICLES: No acute intracranial hemorrhage. No mass effect or midline shift. No extra-axial fluid collection. No evidence of acute infarct. No hydrocephalus. ORBITS: No acute abnormality. SINUSES AND MASTOIDS: Air-fluid level in the right maxillary sinus compatible with acute sinusitis. SOFT TISSUES AND SKULL: No acute skull fracture. No acute soft tissue abnormality. IMPRESSION: 1. No acute intracranial abnormality. 2. Acute right maxillary  sinusitis. Electronically signed by: Franky Crease MD 08/19/2024 11:11 PM EST RP Workstation: [id number]    {Document cardiac monitor, telemetry assessment procedure when appropriate:32947} Procedures   Medications Ordered in the ED  amLODipine  (NORVASC ) tablet 5 mg (5 mg Oral Patient Refused/Not Given 08/19/24 2319)  ketorolac  (TORADOL ) 15 MG/ML injection 15 mg (has no administration in time range)  metoCLOPramide  (REGLAN ) injection 10 mg (has no administration  in time range)  diphenhydrAMINE  (BENADRYL ) injection 12.5 mg (has no administration in time range)  sodium chloride  0.9 % bolus 1,000 mL (1,000 mLs Intravenous New Bag/Given 08/19/24 2305)      {Click here for ABCD2, HEART and other calculators REFRESH Note before signing:1}                              Medical Decision Making Patient complains of a headache.  Patient went to urgent care for evaluation of her headache and was found to be hypertensive.  Patient is not currently taking any blood pressure medicines  Amount and/or Complexity of Data Reviewed Independent Historian: parent    Details: Patient is here with her mother who is supportive Labs: ordered. Decision-making details documented in ED Course.    Details: Labs ordered reviewed and interpreted patient has normal white blood cell count chemistries are normal. Radiology: ordered and independent interpretation performed. Decision-making details documented in ED Course.    Details: CT  head shows no acute findings.  Risk Prescription drug management. Risk Details: Patient is given IV fluids x 1 L.  Toradol  Benadryl  and Reglan .  Patient is given amlodipine  for hypertension.  Patient is given a prescription for amlodipine  she is advised to follow-up with her primary care physician for recheck.  Patient is discharged in stable condition.     {Document critical care time when appropriate  Document review of labs and clinical decision tools ie CHADS2VASC2, etc  Document your independent review of radiology images and any outside records  Document your discussion with family members, caretakers and with consultants  Document social determinants of health affecting pt's care  Document your decision making why or why not admission, treatments were needed:32947:::1}   Final diagnoses:  None    ED Discharge Orders     None        "

## 2024-08-19 NOTE — ED Notes (Signed)
 Pt requested IV amlodipine  vs. PO due to upset stomach/nausea. Provider made aware.

## 2024-08-19 NOTE — ED Notes (Addendum)
.  Patient is being discharged from the Urgent Care and sent to the Emergency Department via POV . Per PA C, patient is in need of higher level of care due to HTN urgency. Patient is aware and verbalizes understanding of plan of care.  Vitals:   08/19/24 1430  BP: (!) 194/106  Pulse: 73  Resp: 16  Temp: 98.7 F (37.1 C)  SpO2: 99%

## 2024-08-19 NOTE — ED Triage Notes (Signed)
 Pt reports nausea, headache, no energy, low appetite, cough x 2 weeks, Nyquil, Mucinex DM, Flonase gives no relief.

## 2024-08-20 MED ORDER — AMLODIPINE BESYLATE 5 MG PO TABS: 5.0000 mg | ORAL_TABLET | Freq: Every day | ORAL | 2 refills | Status: AC
